# Patient Record
Sex: Female | Born: 1995 | Race: Black or African American | Hispanic: No | Smoking: Never smoker
Health system: Southern US, Community
[De-identification: ages and names within clinical notes are randomized; demographics above are authoritative.]

## PROBLEM LIST (undated history)

## (undated) ENCOUNTER — Inpatient Hospital Stay (HOSPITAL_COMMUNITY): Payer: Self-pay

## (undated) ENCOUNTER — Emergency Department (HOSPITAL_BASED_OUTPATIENT_CLINIC_OR_DEPARTMENT_OTHER): Admission: EM | Payer: 59 | Source: Home / Self Care

## (undated) DIAGNOSIS — R569 Unspecified convulsions: Secondary | ICD-10-CM

## (undated) DIAGNOSIS — E079 Disorder of thyroid, unspecified: Secondary | ICD-10-CM

## (undated) DIAGNOSIS — F319 Bipolar disorder, unspecified: Secondary | ICD-10-CM

## (undated) DIAGNOSIS — K219 Gastro-esophageal reflux disease without esophagitis: Secondary | ICD-10-CM

## (undated) DIAGNOSIS — J45909 Unspecified asthma, uncomplicated: Secondary | ICD-10-CM

## (undated) DIAGNOSIS — G43909 Migraine, unspecified, not intractable, without status migrainosus: Secondary | ICD-10-CM

## (undated) DIAGNOSIS — D169 Benign neoplasm of bone and articular cartilage, unspecified: Secondary | ICD-10-CM

## (undated) DIAGNOSIS — F32A Depression, unspecified: Secondary | ICD-10-CM

## (undated) DIAGNOSIS — F419 Anxiety disorder, unspecified: Secondary | ICD-10-CM

## (undated) DIAGNOSIS — N803 Endometriosis of pelvic peritoneum: Secondary | ICD-10-CM

## (undated) DIAGNOSIS — M62838 Other muscle spasm: Secondary | ICD-10-CM

## (undated) DIAGNOSIS — T7840XA Allergy, unspecified, initial encounter: Secondary | ICD-10-CM

## (undated) HISTORY — PX: COLONOSCOPY: SHX174

## (undated) HISTORY — DX: Unspecified convulsions: R56.9

## (undated) HISTORY — DX: Unspecified asthma, uncomplicated: J45.909

## (undated) HISTORY — DX: Migraine, unspecified, not intractable, without status migrainosus: G43.909

## (undated) HISTORY — PX: KNEE SURGERY: SHX244

## (undated) HISTORY — PX: UPPER GI ENDOSCOPY: SHX6162

## (undated) HISTORY — DX: Gastro-esophageal reflux disease without esophagitis: K21.9

## (undated) HISTORY — DX: Allergy, unspecified, initial encounter: T78.40XA

## (undated) HISTORY — DX: Other muscle spasm: M62.838

## (undated) HISTORY — PX: WISDOM TOOTH EXTRACTION: SHX21

---
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
## 2015-02-11 ENCOUNTER — Emergency Department (HOSPITAL_COMMUNITY): Payer: Medicaid Other

## 2015-02-11 ENCOUNTER — Emergency Department (HOSPITAL_COMMUNITY)
Admission: EM | Admit: 2015-02-11 | Discharge: 2015-02-11 | Disposition: A | Discharge status: Home / Self Care | Payer: Medicaid Other | Attending: Emergency Medicine | Admitting: Emergency Medicine

## 2015-02-11 ENCOUNTER — Encounter (HOSPITAL_COMMUNITY): Payer: Self-pay | Admitting: *Deleted

## 2015-02-11 DIAGNOSIS — M25512 Pain in left shoulder: Secondary | ICD-10-CM

## 2015-02-11 DIAGNOSIS — S4992XA Unspecified injury of left shoulder and upper arm, initial encounter: Secondary | ICD-10-CM | POA: Diagnosis not present

## 2015-02-11 DIAGNOSIS — Z791 Long term (current) use of non-steroidal anti-inflammatories (NSAID): Secondary | ICD-10-CM | POA: Insufficient documentation

## 2015-02-11 DIAGNOSIS — W228XXA Striking against or struck by other objects, initial encounter: Secondary | ICD-10-CM | POA: Insufficient documentation

## 2015-02-11 DIAGNOSIS — Y998 Other external cause status: Secondary | ICD-10-CM | POA: Insufficient documentation

## 2015-02-11 DIAGNOSIS — Z8659 Personal history of other mental and behavioral disorders: Secondary | ICD-10-CM | POA: Diagnosis not present

## 2015-02-11 DIAGNOSIS — Z8639 Personal history of other endocrine, nutritional and metabolic disease: Secondary | ICD-10-CM | POA: Diagnosis not present

## 2015-02-11 DIAGNOSIS — Y9389 Activity, other specified: Secondary | ICD-10-CM | POA: Insufficient documentation

## 2015-02-11 DIAGNOSIS — Y9289 Other specified places as the place of occurrence of the external cause: Secondary | ICD-10-CM | POA: Diagnosis not present

## 2015-02-11 DIAGNOSIS — Z91040 Latex allergy status: Secondary | ICD-10-CM | POA: Diagnosis not present
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
## 2015-02-11 NOTE — Discharge Instructions (Signed)
Please use ice, ibuprofen, Tylenol as needed for pain. Please follow-up with your primary care provider in one week if symptoms persist.

## 2015-02-11 NOTE — ED Provider Notes (Signed)
CSN: [id number]     Arrival date & time 02/11/15  1108 History   First MD Initiated Contact with Patient 02/11/15 1124     Chief Complaint  Patient presents with  . Shoulder Pain   HPI   19 year old female with no significant past medical history presents today with left shoulder pain. Patient reports on Wednesday she was pushed into a wall accidentally and hit the tip of her left acromion process. She notes immediate pain at that time, reports that she's been using ice and previously prescribed meloxicam for the pain. She reports the pain is worse with movements of the shoulder, palpation of the shoulder. She denies any pain at rest, distal extremity weakness loss of sensation or motor function. 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   Past Medical History  Diagnosis Date  . Thyroid disease   . Anxiety    History reviewed. No pertinent past surgical history. No family history on file. Social History  Substance Use Topics  . Smoking status: Never Smoker   . Smokeless tobacco: None  . Alcohol Use: No   OB History    No data available     Review of Systems  All other systems reviewed and are negative.  Allergies  Latex  Home Medications   Prior to Admission medications   Medication Sig Start Date End Date Taking? Authorizing Zoriana Oats  ibuprofen (ADVIL,MOTRIN) 800 MG tablet Take 1 tablet (800 mg total) by mouth 3 (three) times daily. 10/01/14   Dahlia Bailiff, PA-C   BP 123/68 mmHg  Pulse 90  Temp(Src) 99.1 F (37.3 C) (Oral)  Resp 16  SpO2 98%  LMP 02/05/2015   Physical Exam  Constitutional: She is oriented to person, place, and time. She appears well-developed and well-nourished.  HENT:  Head: Normocephalic and atraumatic.  Eyes: Conjunctivae are normal. Pupils are equal, round, and reactive to light. Right eye exhibits no discharge. Left eye exhibits no discharge. No scleral icterus.   Neck: Normal range of motion. No JVD present. No tracheal deviation present.  Pulmonary/Chest: Effort normal. No stridor.  Musculoskeletal:  Redness to the acromion process, tenderness to palpation, no obvious deformities. Patient has minimal tenderness to the soft tissues of the left lateral neck. Full active range of motion of the elbow shoulder, pain with range of motion. distal sensation, strength, perfusion intact  Neurological: She is alert and oriented to person, place, and time. Coordination normal.  Psychiatric: She has a normal mood and affect. Her behavior is normal. Judgment and thought content normal.  Nursing note and vitals reviewed.   ED Course  Procedures (including critical care time)   Labs Review Labs Reviewed - No data to display  Imaging Review Dg Ribs Unilateral W/chest Left  02/11/2015   CLINICAL DATA:  19 year old female with a history of left shoulder pain. Trauma.  EXAM: LEFT RIBS AND CHEST - 3+ VIEW  COMPARISON:  10/01/2014  FINDINGS: No fracture or other bone lesions are seen involving the ribs. There is no evidence of pneumothorax or pleural effusion. Both lungs are clear. Heart size and mediastinal contours are within normal limits.  IMPRESSION: Negative.  Signed,  Dulcy Fanny. Earleen Newport, DO  [facility]  [facility]   Electronically Signed   By: Corrie Mckusick D.O.   On: 02/11/2015 13:33   Dg Shoulder Left  02/11/2015   CLINICAL DATA:  Left posterior  shoulder pain, slammed against wall last night  EXAM: LEFT SHOULDER - 2+ VIEW  COMPARISON:  10/01/2014  FINDINGS: Two views of the left shoulder submitted. No acute fracture or subluxation. AC joint and glenohumeral joint are preserved.  IMPRESSION: Negative.   Electronically Signed   By: Lahoma Crocker M.D.   On: 02/11/2015 11:46   I have personally reviewed and evaluated these images and lab results as part of my medical decision-making.   EKG Interpretation None       MDM   Final diagnoses:  Left shoulder pain    Labs:   Imaging: DG Shoulder, DG ribs- negative   Consults:  Therapeutics:  Discharge Meds: Ibuprofen tylenol   Assessment/Plan: Patient presents with left shoulder pain, negative plain films. She does have some swelling to the shoulder, continue ice, ibuprofen, Tylenol. She has no significant abnormalities of the musculature surrounding the shoulder, intact distal grip strength, sensation. Patient is instructed to follow-up with her primary care Reighlyn Elmes in 1-2 weeks for reevaluation, she was given strict return precautions, patient verbalized understanding and agreement for today's plan had no further questions or concerns at time of discharge        Okey Regal, PA-C 02/11/15 1341  Okey Regal, PA-C 02/11/15 1342  Leonard Schwartz, MD 02/12/15 [phone]

## 2015-03-27 ENCOUNTER — Encounter (HOSPITAL_COMMUNITY): Payer: Self-pay | Admitting: Emergency Medicine

## 2015-03-27 ENCOUNTER — Emergency Department (HOSPITAL_COMMUNITY)
Admission: EM | Admit: 2015-03-27 | Discharge: 2015-03-27 | Disposition: A | Discharge status: Home / Self Care | Payer: Medicaid Other | Attending: Emergency Medicine | Admitting: Emergency Medicine

## 2015-03-27 ENCOUNTER — Emergency Department (HOSPITAL_COMMUNITY): Payer: Medicaid Other

## 2015-03-27 DIAGNOSIS — W2209XA Striking against other stationary object, initial encounter: Secondary | ICD-10-CM | POA: Diagnosis not present

## 2015-03-27 DIAGNOSIS — Z791 Long term (current) use of non-steroidal anti-inflammatories (NSAID): Secondary | ICD-10-CM | POA: Diagnosis not present

## 2015-03-27 DIAGNOSIS — Y9389 Activity, other specified: Secondary | ICD-10-CM | POA: Diagnosis not present

## 2015-03-27 DIAGNOSIS — Y998 Other external cause status: Secondary | ICD-10-CM | POA: Insufficient documentation

## 2015-03-27 DIAGNOSIS — Z8659 Personal history of other mental and behavioral disorders: Secondary | ICD-10-CM | POA: Insufficient documentation

## 2015-03-27 DIAGNOSIS — Z8639 Personal history of other endocrine, nutritional and metabolic disease: Secondary | ICD-10-CM | POA: Diagnosis not present

## 2015-03-27 DIAGNOSIS — S99921A Unspecified injury of right foot, initial encounter: Secondary | ICD-10-CM | POA: Diagnosis present

## 2015-03-27 DIAGNOSIS — Y9289 Other specified places as the place of occurrence of the external cause: Secondary | ICD-10-CM | POA: Insufficient documentation

## 2015-03-27 DIAGNOSIS — Z91040 Latex allergy status: Secondary | ICD-10-CM | POA: Diagnosis not present

## 2015-03-27 DIAGNOSIS — Z86018 Personal history of other benign neoplasm: Secondary | ICD-10-CM | POA: Diagnosis not present

## 2015-03-27 HISTORY — DX: Benign neoplasm of bone and articular cartilage, unspecified: D16.9

## 2015-03-27 MED ORDER — MELOXICAM 15 MG PO TABS: 15.0000 mg | ORAL_TABLET | Freq: Every day | ORAL | Status: DC

## 2015-03-27 NOTE — Discharge Instructions (Signed)
Take mobic as needed for pain. Rest, ice, and elevate your foot for pain and swelling relief.

## 2015-03-27 NOTE — ED Notes (Signed)
Pt states she was mad and kicked a fan and hit the top of her foot on the fan  Pt states the pain runs across the top of her foot and she is unable to move her toes

## 2015-03-27 NOTE — ED Provider Notes (Signed)
History  By signing my name below, I, Marlowe Kays, attest that this documentation has been prepared under the direction and in the presence of [employer], PA-C. Electronically Signed: Marlowe Kays, ED Scribe. 03/27/2015. 9:39 PM.  Chief Complaint  Patient presents with  . Foot Injury   The history is provided by the patient and medical records. No language interpreter was used.    HPI Comments:  Veronica Buchanan is a 19 y.o. female who presents to the Emergency Department complaining of worsening pain of the right foot secondary to kicking a fan three days ago. She reports associated swelling of the dorsal right foot. She has taken Tyenol and Ibuprofen for pain and elevating the area with no significant relief of the pain. She has not iced the area stating she does not have any ice at home. Bearing weight makes the pain worse. She denies alleviating factors of the pain. She denies numbness, tingling or weakness of the right foot, bruising or wounds.   Past Medical History  Diagnosis Date  . Thyroid disease   . Anxiety   . Osteochondroma    History reviewed. No pertinent past surgical history. Family History  Problem Relation Age of Onset  . Asthma Other    Social History  Substance Use Topics  . Smoking status: Never Smoker   . Smokeless tobacco: None  . Alcohol Use: No   OB History    No data available     Review of Systems  Musculoskeletal: Positive for joint swelling and arthralgias.  All other systems reviewed and are negative.   Allergies  Latex  Home Medications   Prior to Admission medications   Medication Sig Start Date End Date Taking? Authorizing Provider  ibuprofen (ADVIL,MOTRIN) 800 MG tablet Take 1 tablet (800 mg total) by mouth 3 (three) times daily. 10/01/14   Dahlia Bailiff, PA-C  meloxicam (MOBIC) 15 MG tablet Take 1 tablet (15 mg total) by mouth daily. 03/27/15   Alvina Chou, PA-C   Triage Vitals: BP 115/57 mmHg  Pulse 75  Temp(Src) 98.1  F (36.7 C) (Oral)  Resp 18  SpO2 100% Physical Exam  Constitutional: She is oriented to person, place, and time. She appears well-developed and well-nourished. No distress.  HENT:  Head: Normocephalic and atraumatic.  Eyes:  Normal appearance  Neck: Normal range of motion.  Cardiovascular: Normal rate and regular rhythm.   Pulmonary/Chest: Effort normal.  Abdominal: Soft. She exhibits no distension. There is no tenderness.  Musculoskeletal: Normal range of motion. She exhibits tenderness.  Dorsal foot tenderness to palpation without swelling, bruising, wound or deformity. Pt able to wiggle toes.  Neurological: She is alert and oriented to person, place, and time. Coordination normal.  Skin: Skin is warm and dry.  Psychiatric: She has a normal mood and affect. Her behavior is normal.  Nursing note and vitals reviewed.   ED Course  Procedures (including critical care time) DIAGNOSTIC STUDIES: Oxygen Saturation is 100% on RA, normal by my interpretation.   COORDINATION OF CARE: 9:36 PM- Advised pt to RICE area. Will prescribe NSAID and advised pt to follow up with PCP for continued pain. Pt verbalizes understanding and agrees to plan.  Medications - No data to display  Labs Review Labs Reviewed - No data to display  Imaging Review Dg Foot Complete Right  03/27/2015  CLINICAL DATA:  Injury 3 days ago, kicked a fan, right foot pain EXAM: RIGHT FOOT COMPLETE - 3+ VIEW COMPARISON:  None. FINDINGS: Three views of the  right foot submitted. No acute fracture or subluxation. No radiopaque foreign body. IMPRESSION: Negative. Electronically Signed   By: Lahoma Crocker M.D.   On: 03/27/2015 20:08   I have personally reviewed and evaluated these images and lab results as part of my medical decision-making.   EKG Interpretation None      MDM   Final diagnoses:  Right foot injury, initial encounter    Xray unremarkable for acute changes. Patient advised to rest, ice, and elevate the  foot for symptom relief.   I personally performed the services described in this documentation, which was scribed in my presence. The recorded information has been reviewed and is accurate.     Alvina Chou, PA-C 03/28/15 0206  Carmin Muskrat, MD 03/29/15 0003

## 2015-04-26 ENCOUNTER — Ambulatory Visit (INDEPENDENT_AMBULATORY_CARE_PROVIDER_SITE_OTHER): Payer: Medicaid Other | Admitting: Allergy and Immunology

## 2015-04-26 ENCOUNTER — Encounter: Payer: Self-pay | Admitting: Allergy and Immunology

## 2015-04-26 VITALS — BP 116/68 | HR 76 | Temp 98.5°F | Resp 18

## 2015-04-26 DIAGNOSIS — J309 Allergic rhinitis, unspecified: Secondary | ICD-10-CM | POA: Diagnosis not present

## 2015-04-26 DIAGNOSIS — R05 Cough: Secondary | ICD-10-CM

## 2015-04-26 DIAGNOSIS — H1010 Acute atopic conjunctivitis, unspecified eye: Secondary | ICD-10-CM

## 2015-04-26 DIAGNOSIS — R062 Wheezing: Secondary | ICD-10-CM | POA: Diagnosis not present

## 2015-04-26 DIAGNOSIS — R059 Cough, unspecified: Secondary | ICD-10-CM

## 2015-04-26 NOTE — Progress Notes (Signed)
FOLLOW UP NOTE  RE: Veronica Buchanan MRN: [id number] DOB: Mar 07, 1996 [facility] [place] [address] [place] [address] Date of Office Visit: 04/26/2015  Subjective:  Veronica Buchanan is a 20 y.o. female who presents today for Cough  Assessment:   1. Cough, patient report of recent upper respiratory infection, afebrile in no respiratory distress.  2. History of wheeze, probable component of mild asthma.  3.      Allergic rhinoconjunctivitis.  Plan:   Patient Instructions  1. Avoidance: significant temperature changes and irritant exposures as discussed. 2. Antihistamine: Zyrtec 10mg  once by mouth once daily for runny nose or itching. 3. Nasal Spray: Flonase 1-2 spray(s) each nostril once daily for stuffy nose or drainage.  4. Inhalers:  Rescue: ProAir 2 puffs every 4 hours as needed for cough or wheeze.       -May use 2 puffs 10-20 minutes prior to exercise.  Preventative: QVAR 62mcg 2 puffs twice daily (Rinse, gargle, and spit out after use). 7. Nasal Saline wash each morning prior to Flonase and each evening at bath/shower time. 8. Follow up Visit: 6 months or sooner if needed.  HPI: Veronica Buchanan returns to the office in follow-up of allergic rhinoconjunctivitis and cough.  She describes a recent "cold" without difficulty in breathing, shortness of breath or wheeze but mostly postnasal drip, nasal congestion and slight cough.  She denied any chest congestion or tightness and did not have to use ProAir.  Her symptoms were greater 4 days ago, a question of associated fever, prior to that.  Any sore throat and headache has resolved and her sleep and activity are normal.  She did use NyQuil a few days and has maintained on her other medications without issue.  She denies any skin issues.  She is pleased overall with how she has done and has no questions or issues today.  She denies emergency department or urgent care visits recently,  prednisone or antibiotics.  Current Medications: 1. Flonase 1-2 sprays once daily. 2. Qvar 80 g 2 puffs once-twice daily. 3.  ProAir HFA as needed. 4.  Zyrtec 10 mg daily. 5.  BuSpar, Synthroid, Vyvanse, Mobic, Atarax daily.  Drug Allergies: Allergies  Allergen Reactions  . Latex    Objective:   Filed Vitals:   04/26/15 1738  BP: 116/68  Pulse: 76  Temp: 98.5 F (36.9 C)  Resp: 18   Physical Exam  Constitutional: She is well-developed, well-nourished, and in no distress.  HENT:  Head: Atraumatic.  Right Ear: Tympanic membrane and ear canal normal.  Left Ear: Tympanic membrane and ear canal normal.  Nose: Mucosal edema present. No rhinorrhea. No epistaxis.  Mouth/Throat: Oropharynx is clear and moist and mucous membranes are normal. No oropharyngeal exudate, posterior oropharyngeal edema or posterior oropharyngeal erythema.  Neck: Neck supple.  Cardiovascular: Normal rate, S1 normal and S2 normal.   No murmur heard. Pulmonary/Chest: Effort normal and breath sounds normal. No respiratory distress. She has no wheezes. She has no rhonchi. She has no rales.  Lymphadenopathy:    She has no cervical adenopathy.   Diagnostics: Spirometry:  FVC  3.23--102%,  FEV1  2.50--88%.      Braley Luckenbaugh M. Ishmael Holter, MD  cc: Eldridge Abrahams, NP

## 2015-04-26 NOTE — Patient Instructions (Signed)
Take Home Sheet  1. Avoidance: significant temperature changes and irritant exposures as discussed.   2. Antihistamine: Zyrtec 10mg  once by mouth once daily for runny nose or itching.   3. Nasal Spray: Flonase 1-2 spray(s) each nostril once daily for stuffy nose or drainage.    4. Inhalers:  Rescue: ProAir 2 puffs every 4 hours as needed for cough or wheeze.       -May use 2 puffs 10-20 minutes prior to exercise.   Preventative: QVAR 22mcg 2 puffs twice daily (Rinse, gargle, and spit out after use).   7. Nasal Saline wash each morning prior to Flonase and each evening at bath/shower time.   8. Follow up Visit: 6 months or sooner if needed.   Websites that have reliable Patient information: 1. American Academy of Asthma, Allergy, & Immunology: www.aaaai.org 2. Food Allergy Network: www.foodallergy.org 3. Mothers of Asthmatics: www.aanma.org 4. [place]: DiningCalendar.de 5. American College of Allergy, Asthma, & Immunology: https://robertson.info/ or www.acaai.org

## 2015-07-27 ENCOUNTER — Emergency Department (HOSPITAL_COMMUNITY)
Admission: EM | Admit: 2015-07-27 | Discharge: 2015-07-27 | Disposition: A | Discharge status: Home / Self Care | Payer: Medicaid Other | Attending: Emergency Medicine | Admitting: Emergency Medicine

## 2015-07-27 ENCOUNTER — Emergency Department (HOSPITAL_COMMUNITY): Payer: Medicaid Other

## 2015-07-27 ENCOUNTER — Encounter (HOSPITAL_COMMUNITY): Payer: Self-pay | Admitting: Emergency Medicine

## 2015-07-27 DIAGNOSIS — S6991XA Unspecified injury of right wrist, hand and finger(s), initial encounter: Secondary | ICD-10-CM | POA: Diagnosis present

## 2015-07-27 DIAGNOSIS — Z91040 Latex allergy status: Secondary | ICD-10-CM | POA: Insufficient documentation

## 2015-07-27 DIAGNOSIS — W228XXA Striking against or struck by other objects, initial encounter: Secondary | ICD-10-CM | POA: Diagnosis not present

## 2015-07-27 DIAGNOSIS — M79641 Pain in right hand: Secondary | ICD-10-CM

## 2015-07-27 DIAGNOSIS — E079 Disorder of thyroid, unspecified: Secondary | ICD-10-CM | POA: Insufficient documentation

## 2015-07-27 DIAGNOSIS — Y998 Other external cause status: Secondary | ICD-10-CM | POA: Insufficient documentation

## 2015-07-27 DIAGNOSIS — F419 Anxiety disorder, unspecified: Secondary | ICD-10-CM | POA: Diagnosis not present

## 2015-07-27 DIAGNOSIS — Z79899 Other long term (current) drug therapy: Secondary | ICD-10-CM | POA: Insufficient documentation

## 2015-07-27 DIAGNOSIS — Y9389 Activity, other specified: Secondary | ICD-10-CM | POA: Diagnosis not present

## 2015-07-27 DIAGNOSIS — F319 Bipolar disorder, unspecified: Secondary | ICD-10-CM | POA: Insufficient documentation

## 2015-07-27 DIAGNOSIS — Y9289 Other specified places as the place of occurrence of the external cause: Secondary | ICD-10-CM | POA: Diagnosis not present

## 2015-07-27 DIAGNOSIS — Z7951 Long term (current) use of inhaled steroids: Secondary | ICD-10-CM | POA: Diagnosis not present

## 2015-07-27 HISTORY — DX: Bipolar disorder, unspecified: F31.9

## 2015-07-27 NOTE — ED Provider Notes (Signed)
CSN: [id number]     Arrival date & time 07/27/15  1216 History  By signing my name below, I, Meriel Pica, attest that this documentation has been prepared under the direction and in the presence of [employer], PA-C.  Electronically Signed: Meriel Pica, ED Scribe. 07/27/2015. 1:47 PM.   Chief Complaint  Patient presents with  . Wrist Pain   The history is provided by the patient. No language interpreter was used.   HPI Comments: Nadirah Durnin is a 20 y.o. female who presents to the Emergency Department complaining of sudden onset, constant, moderate right hand pain, more predominant to dorsal aspect s/p punching a wall with a closed fist. She states she hit the lateral side of her hand on the wall several times. No wound noted to right hand.  Past Medical History  Diagnosis Date  . Thyroid disease   . Anxiety   . Osteochondroma   . Bipolar disorder ([place])    History reviewed. No pertinent past surgical history. Family History  Problem Relation Age of Onset  . Asthma Other    Social History  Substance Use Topics  . Smoking status: Never Smoker   . Smokeless tobacco: None  . Alcohol Use: No   OB History    No data available     Review of Systems  Musculoskeletal: Positive for arthralgias ( right wrist).  Skin: Negative for wound.  All other systems reviewed and are negative.  Allergies  Latex  Home Medications   Prior to Admission medications   Medication Sig Start Date End Date Taking? Authorizing Provider  albuterol (PROVENTIL HFA;VENTOLIN HFA) 108 (90 BASE) MCG/ACT inhaler Inhale 2 puffs into the lungs every 6 (six) hours as needed for wheezing or shortness of breath.    Historical Provider, MD  beclomethasone (QVAR) 80 MCG/ACT inhaler Inhale 2 puffs into the lungs 2 (two) times daily.    Historical Provider, MD  busPIRone (BUSPAR) 10 MG tablet Take 10 mg by mouth 3 (three) times daily.    Historical Provider, MD  cetirizine (ZYRTEC) 10 MG tablet Take 10 mg by  mouth daily.    Historical Provider, MD  fluticasone (FLONASE) 50 MCG/ACT nasal spray Place 1 spray into both nostrils daily.    Historical Provider, MD  hydrOXYzine (ATARAX/VISTARIL) 10 MG tablet Take 10 mg by mouth 3 (three) times daily as needed.    Historical Provider, MD  ibuprofen (ADVIL,MOTRIN) 800 MG tablet Take 1 tablet (800 mg total) by mouth 3 (three) times daily. Patient not taking: Reported on 04/26/2015 10/01/14   Dahlia Bailiff, PA-C  levothyroxine (SYNTHROID, LEVOTHROID) 25 MCG tablet Take 25 mcg by mouth daily before breakfast.    Historical Provider, MD  lisdexamfetamine (VYVANSE) 50 MG capsule Take 50 mg by mouth daily.    Historical Provider, MD  meloxicam (MOBIC) 15 MG tablet Take 1 tablet (15 mg total) by mouth daily. 03/27/15   Kaitlyn Szekalski, PA-C   BP 113/70 mmHg  Pulse 79  Temp(Src) 98.5 F (36.9 C) (Oral)  Resp 18  SpO2 99%  Physical Exam  Constitutional: She is oriented to person, place, and time. She appears well-developed and well-nourished. No distress.  HENT:  Head: Normocephalic.  Eyes: Conjunctivae are normal.  Neck: Normal range of motion. Neck supple.  Cardiovascular: Normal rate.   Pulmonary/Chest: Effort normal. No respiratory distress.  Musculoskeletal: Normal range of motion.  Snuffbox tenderness right hand, no obvious signs of trauma or deformity to the wrist or hands. Superficial abrasion to the ulnar  palm  Neurological: She is alert and oriented to person, place, and time. Coordination normal.  Skin: Skin is warm.  Psychiatric: She has a normal mood and affect. Her behavior is normal.  Nursing note and vitals reviewed.   ED Course  Procedures  DIAGNOSTIC STUDIES: Oxygen Saturation is 99% on RA, normal by my interpretation.    COORDINATION OF CARE: 1:43 PM Discussed treatment plan with pt at bedside and pt agreed to plan.  Imaging Review Dg Wrist Complete Right  07/27/2015  CLINICAL DATA:  Hit wall earlier today EXAM: RIGHT WRIST - COMPLETE  3+ VIEW COMPARISON:  None. FINDINGS: Frontal, oblique, lateral, and ulnar deviation scaphoid images were obtained. On the ulnar deviation scaphoid image, there is a subtle linear lucency in the mid scaphoid bone. This lucency may represent a prominent nutrient foramen. A subtle fracture in this area, however, cannot be entirely excluded radiographically. There is no other evidence of potential fracture. No dislocation. The joint spaces appear intact. No erosive change. IMPRESSION: Subtle transverse lucency in the mid scaphoid bone region. This finding may well represent a prominent nutrient foramen. A subtle nondisplaced fracture in this area is possible, however. If the patient is focally tender in the mid scaphoid region, would advise immobilization with reimaging in 7-10 days. No other findings suggesting potential fracture. No dislocation. No appreciable arthropathy. Electronically Signed   By: Lowella Grip III M.D.   On: 07/27/2015 13:13   Dg Hand Complete Right  07/27/2015  CLINICAL DATA:  Hit wall. Pain along the medial aspect of the hand and wrist. EXAM: RIGHT HAND - COMPLETE 3+ VIEW COMPARISON:  Right wrist 07/27/2015 FINDINGS: There is no evidence of fracture or dislocation. There is no evidence of arthropathy or other focal bone abnormality. Soft tissues are unremarkable. IMPRESSION: Negative. Electronically Signed   By: Markus Daft M.D.   On: 07/27/2015 13:11   I have personally reviewed and evaluated these images results as part of my medical decision-making.   MDM   Final diagnoses:  Pain of right hand   Labs:  Imaging: Dg right wrist results in possible nondisplaced fracture of mid scaphoid; immobilization recommended. Dg right hand negative.  Consults:  Therapeutics:  Discharge Meds:   Assessment/Plan: Will immobilize right wrist/hand. Advised ice and ibuprofen. Discussed with pt she will need a follow up Xray in 2 weeks for further evaluation of scaphoid  tenderness.   Patient X-Ray negative for obvious fracture or dislocation. . Patient given thumb spica while in ED, conservative therapy recommended and discussed. Patient will be discharged home & is agreeable with above plan. Returns precautions discussed. Pt appears safe for discharge.   I personally performed the services described in this documentation, which was scribed in my presence. The recorded information has been reviewed and is accurate.    Okey Regal, PA-C 07/27/15 1451  Quintella Reichert, MD 07/28/15 0700

## 2015-07-27 NOTE — ED Notes (Signed)
Pt sts right wrist pain after punching wall at school; CMS intact

## 2015-07-27 NOTE — Discharge Instructions (Signed)
Discussed with the patient and all questioned fully answered. She will call me if any problems arise.

## 2016-04-07 DIAGNOSIS — J4521 Mild intermittent asthma with (acute) exacerbation: Secondary | ICD-10-CM

## 2016-04-07 DIAGNOSIS — J069 Acute upper respiratory infection, unspecified: Secondary | ICD-10-CM | POA: Insufficient documentation

## 2016-04-23 ENCOUNTER — Emergency Department (HOSPITAL_COMMUNITY)
Admission: EM | Admit: 2016-04-23 | Discharge: 2016-04-24 | Disposition: A | Discharge status: Home / Self Care | Payer: Medicaid Other | Attending: Emergency Medicine | Admitting: Emergency Medicine

## 2016-04-23 ENCOUNTER — Encounter (HOSPITAL_COMMUNITY): Payer: Self-pay | Admitting: Emergency Medicine

## 2016-04-23 DIAGNOSIS — Y999 Unspecified external cause status: Secondary | ICD-10-CM | POA: Diagnosis not present

## 2016-04-23 DIAGNOSIS — Y929 Unspecified place or not applicable: Secondary | ICD-10-CM | POA: Diagnosis not present

## 2016-04-23 DIAGNOSIS — W228XXA Striking against or struck by other objects, initial encounter: Secondary | ICD-10-CM | POA: Insufficient documentation

## 2016-04-23 DIAGNOSIS — S60221A Contusion of right hand, initial encounter: Secondary | ICD-10-CM | POA: Insufficient documentation

## 2016-04-23 DIAGNOSIS — Y939 Activity, unspecified: Secondary | ICD-10-CM | POA: Diagnosis not present

## 2016-04-23 DIAGNOSIS — S6991XA Unspecified injury of right wrist, hand and finger(s), initial encounter: Secondary | ICD-10-CM | POA: Diagnosis present

## 2016-04-23 NOTE — ED Triage Notes (Signed)
Pt. reports pain at right hand after punching a steering wheel yesterday .

## 2016-04-24 ENCOUNTER — Emergency Department (HOSPITAL_COMMUNITY): Payer: Medicaid Other

## 2016-04-24 MED ORDER — NAPROXEN 375 MG PO TABS: 375.0000 mg | ORAL_TABLET | Freq: Two times a day (BID) | ORAL | 0 refills | Status: DC

## 2016-04-24 NOTE — ED Provider Notes (Signed)
[facility] DEPT Provider Note   CSN: [id number] Arrival date & time: 04/23/16  2325     History   Chief Complaint Chief Complaint  Patient presents with  . Hand Injury    HPI Veronica Buchanan is a 20 y.o. female who presents to the ED with hand pain. Patient reports that she was angry yesterday and hit her hand on her steering wheel. She took 200 mg of ibuprofen without relief. She denies any other injuries.   HPI  Past Medical History:  Diagnosis Date  . Anxiety   . Bipolar disorder ([place])   . Osteochondroma   . Thyroid disease     There are no active problems to display for this patient.   History reviewed. No pertinent surgical history.  OB History    No data available       Home Medications    Prior to Admission medications   Medication Sig Start Date End Date Taking? Authorizing Provider  albuterol (PROVENTIL HFA;VENTOLIN HFA) 108 (90 BASE) MCG/ACT inhaler Inhale 2 puffs into the lungs every 6 (six) hours as needed for wheezing or shortness of breath.    Historical Provider, MD  beclomethasone (QVAR) 80 MCG/ACT inhaler Inhale 2 puffs into the lungs 2 (two) times daily.    Historical Provider, MD  busPIRone (BUSPAR) 10 MG tablet Take 10 mg by mouth 3 (three) times daily.    Historical Provider, MD  cetirizine (ZYRTEC) 10 MG tablet Take 10 mg by mouth daily.    Historical Provider, MD  fluticasone (FLONASE) 50 MCG/ACT nasal spray Place 1 spray into both nostrils daily.    Historical Provider, MD  hydrOXYzine (ATARAX/VISTARIL) 10 MG tablet Take 10 mg by mouth 3 (three) times daily as needed.    Historical Provider, MD  ibuprofen (ADVIL,MOTRIN) 800 MG tablet Take 1 tablet (800 mg total) by mouth 3 (three) times daily. Patient not taking: Reported on 04/26/2015 10/01/14   Dahlia Bailiff, PA-C  levothyroxine (SYNTHROID, LEVOTHROID) 25 MCG tablet Take 25 mcg by mouth daily before breakfast.    Historical Provider, MD  lisdexamfetamine (VYVANSE) 50 MG capsule Take 50 mg by  mouth daily.    Historical Provider, MD  meloxicam (MOBIC) 15 MG tablet Take 1 tablet (15 mg total) by mouth daily. 03/27/15   Kaitlyn Szekalski, PA-C  naproxen (NAPROSYN) 375 MG tablet Take 1 tablet (375 mg total) by mouth 2 (two) times daily. 04/24/16   Lasonya Hubner Bunnie Pion, NP    Family History Family History  Problem Relation Age of Onset  . Asthma Other     Social History Social History  Substance Use Topics  . Smoking status: Never Smoker  . Smokeless tobacco: Never Used  . Alcohol use No     Allergies   Latex   Review of Systems Review of Systems Negative except as stated in HIP  Physical Exam Updated Vital Signs BP 113/66 (BP Location: Right Arm)   Pulse 68   Temp 98.6 F (37 C) (Oral)   Resp 16   Ht 5\' 5"  (1.651 m)   Wt 59.9 kg   SpO2 100%   BMI 21.97 kg/m   Physical Exam  Constitutional: She is oriented to person, place, and time. She appears well-developed and well-nourished. No distress.  HENT:  Head: Normocephalic and atraumatic.  Eyes: EOM are normal.  Neck: Neck supple.  Cardiovascular: Normal rate.   Pulmonary/Chest: Effort normal.  Abdominal: Soft. There is no tenderness.  Musculoskeletal:       Right hand:  She exhibits tenderness. She exhibits normal range of motion, normal capillary refill, no deformity, no laceration and no swelling. Normal sensation noted. Normal strength noted. She exhibits no thumb/finger opposition.       Hands: Pain with palpation to the ulnar aspect of the right hand. Radial pulse 2+, adequate circulation.   Neurological: She is alert and oriented to person, place, and time. No cranial nerve deficit.  Skin: Skin is warm and dry.  Psychiatric: She has a normal mood and affect. Her behavior is normal.  Nursing note and vitals reviewed.    ED Treatments / Results  Labs (all labs ordered are listed, but only abnormal results are displayed) Labs Reviewed - No data to display   Radiology Dg Hand Complete Right  Result  Date: 04/24/2016 CLINICAL DATA:  Injury, punched steering wheel now with pain at the entire wrist EXAM: RIGHT HAND - COMPLETE 3+ VIEW COMPARISON:  07/27/2015 FINDINGS: There is no evidence of fracture or dislocation. There is no evidence of arthropathy or other focal bone abnormality. Soft tissues are unremarkable. IMPRESSION: Negative. Electronically Signed   By: Donavan Foil M.D.   On: 04/24/2016 00:25    Procedures Procedures (including critical care time)  Medications Ordered in ED Medications - No data to display   Initial Impression / Assessment and Plan / ED Course  I have reviewed the triage vital signs and the nursing notes.  Pertinent imaging results that were available during my care of the patient were reviewed by me and considered in my medical decision making (see chart for details).  Clinical Course   20 y.o. female with pain to the right hand s/p injury yesterday stable for d/c without focal neuro deficits. Ace wrap applied, ice, elevation. Discussed with the patient and all questioned fully answered. She will return if any problems arise.   Final Clinical Impressions(s) / ED Diagnoses   Final diagnoses:  Contusion of right hand, initial encounter    New Prescriptions New Prescriptions   NAPROXEN (NAPROSYN) 375 MG TABLET    Take 1 tablet (375 mg total) by mouth 2 (two) times daily.     [address] [place], NP 04/24/16 [place], MD 04/24/16 [phone]

## 2016-08-27 ENCOUNTER — Encounter (HOSPITAL_COMMUNITY): Payer: Self-pay

## 2016-08-27 DIAGNOSIS — D649 Anemia, unspecified: Secondary | ICD-10-CM | POA: Insufficient documentation

## 2016-08-27 DIAGNOSIS — Z91040 Latex allergy status: Secondary | ICD-10-CM | POA: Diagnosis not present

## 2016-08-27 DIAGNOSIS — R079 Chest pain, unspecified: Secondary | ICD-10-CM | POA: Diagnosis present

## 2016-08-27 DIAGNOSIS — R748 Abnormal levels of other serum enzymes: Secondary | ICD-10-CM | POA: Diagnosis not present

## 2016-08-27 DIAGNOSIS — E876 Hypokalemia: Secondary | ICD-10-CM | POA: Diagnosis not present

## 2016-08-27 NOTE — ED Triage Notes (Signed)
Pt was just evaluated and released from [facility] for viral gastroenteritis, she was give ranitidine for medication, pt states that this medication makes her heart race and her chest feel funny

## 2016-08-28 ENCOUNTER — Emergency Department (HOSPITAL_COMMUNITY)
Admission: EM | Admit: 2016-08-28 | Discharge: 2016-08-28 | Disposition: A | Discharge status: Home / Self Care | Payer: Medicaid Other | Attending: Emergency Medicine | Admitting: Emergency Medicine

## 2016-08-28 ENCOUNTER — Emergency Department (HOSPITAL_COMMUNITY): Payer: Medicaid Other

## 2016-08-28 DIAGNOSIS — D649 Anemia, unspecified: Secondary | ICD-10-CM

## 2016-08-28 DIAGNOSIS — R748 Abnormal levels of other serum enzymes: Secondary | ICD-10-CM

## 2016-08-28 DIAGNOSIS — E876 Hypokalemia: Secondary | ICD-10-CM

## 2016-08-28 DIAGNOSIS — R079 Chest pain, unspecified: Secondary | ICD-10-CM

## 2016-08-28 LAB — CBC WITH DIFFERENTIAL/PLATELET
Basophils Absolute: 0 10*3/uL (ref 0.0–0.1)
Basophils Relative: 0 %
EOS ABS: 0.1 10*3/uL (ref 0.0–0.7)
Eosinophils Relative: 2 %
HEMATOCRIT: 37 % (ref 36.0–46.0)
Hemoglobin: 11.8 g/dL — ABNORMAL LOW (ref 12.0–15.0)
LYMPHS ABS: 3 10*3/uL (ref 0.7–4.0)
Lymphocytes Relative: 54 %
MCH: 22.6 pg — ABNORMAL LOW (ref 26.0–34.0)
MCHC: 31.9 g/dL (ref 30.0–36.0)
MCV: 70.9 fL — ABNORMAL LOW (ref 78.0–100.0)
MONO ABS: 0.7 10*3/uL (ref 0.1–1.0)
MONOS PCT: 12 %
NEUTROS ABS: 1.8 10*3/uL (ref 1.7–7.7)
Neutrophils Relative %: 32 %
PLATELETS: 285 10*3/uL (ref 150–400)
RBC: 5.22 MIL/uL — ABNORMAL HIGH (ref 3.87–5.11)
RDW: 13.5 % (ref 11.5–15.5)
WBC: 5.6 10*3/uL (ref 4.0–10.5)

## 2016-08-28 LAB — COMPREHENSIVE METABOLIC PANEL
ALT: 19 U/L (ref 14–54)
ANION GAP: 7 (ref 5–15)
AST: 23 U/L (ref 15–41)
Albumin: 4.5 g/dL (ref 3.5–5.0)
Alkaline Phosphatase: 90 U/L (ref 38–126)
BILIRUBIN TOTAL: 0.4 mg/dL (ref 0.3–1.2)
BUN: 8 mg/dL (ref 6–20)
CHLORIDE: 105 mmol/L (ref 101–111)
CO2: 26 mmol/L (ref 22–32)
Calcium: 8.9 mg/dL (ref 8.9–10.3)
Creatinine, Ser: 0.63 mg/dL (ref 0.44–1.00)
GFR calc Af Amer: >60 mL/min (ref >60)
GFR calc non Af Amer: >60 mL/min (ref >60)
GLUCOSE: 82 mg/dL (ref 65–99)
Potassium: 3.3 mmol/L — ABNORMAL LOW (ref 3.5–5.1)
SODIUM: 138 mmol/L (ref 135–145)
TOTAL PROTEIN: 7.7 g/dL (ref 6.5–8.1)

## 2016-08-28 LAB — D-DIMER, QUANTITATIVE: D-Dimer, Quant: <0.27 ug/mL-FEU (ref 0.00–0.50)

## 2016-08-28 LAB — LIPASE, BLOOD: Lipase: 113 U/L — ABNORMAL HIGH (ref 11–51)

## 2016-08-28 MED ORDER — PANTOPRAZOLE SODIUM 40 MG IV SOLR
40.0000 mg | Freq: Once | INTRAVENOUS | Status: AC
  Administered 2016-08-28: 40 mg via INTRAVENOUS
  Filled 2016-08-28: qty 40

## 2016-08-28 MED ORDER — PANTOPRAZOLE SODIUM 20 MG PO TBEC: 20.0000 mg | DELAYED_RELEASE_TABLET | Freq: Every day | ORAL | 0 refills | Status: DC

## 2016-08-28 MED ORDER — POTASSIUM CHLORIDE CRYS ER 20 MEQ PO TBCR
40.0000 meq | EXTENDED_RELEASE_TABLET | Freq: Once | ORAL | Status: AC
  Administered 2016-08-28: 40 meq via ORAL
  Filled 2016-08-28: qty 2

## 2016-08-28 NOTE — ED Provider Notes (Signed)
[facility] DEPT Provider Note   CSN: [id number] Arrival date & time: 08/27/16  2335   By signing my name below, I, Evelene Croon, attest that this documentation has been prepared under the direction and in the presence of [employer], PA-C. Electronically Signed: Evelene Croon, Scribe. 08/28/2016. 1:59 AM.  History   Chief Complaint Chief Complaint  Patient presents with  . Chest Pain    The history is provided by the patient. No language interpreter was used.     HPI Comments:  Veronica Buchanan is a 21 y.o. female who presents to the Emergency Department complaining of central/ left sided CP x 3 days. She describes constant "shocks of pain". No alleviating factors noted. Pt also denies cardiac PMHx. Pt was recently diagnosed with pancreatitis and admitted to [facility] in [place]; was discharged 3 days ago. She was experiencing epigastric pain, nausea, vomiting, and diarrhea at the time of admission. She states she has not vomited in the last 24 hours. Pt denies ETOH consumption. She also denies back pain, dysuria, and fever. Pt is compliant with her Synthroid.    Past Medical History:  Diagnosis Date  . Anxiety   . Bipolar disorder ([place])   . Osteochondroma   . Thyroid disease     There are no active problems to display for this patient.   History reviewed. No pertinent surgical history.  OB History    No data available       Home Medications    Prior to Admission medications   Medication Sig Start Date End Date Taking? Authorizing Provider  albuterol (PROVENTIL HFA;VENTOLIN HFA) 108 (90 BASE) MCG/ACT inhaler Inhale 2 puffs into the lungs every 6 (six) hours as needed for wheezing or shortness of breath.   Yes Historical Provider, MD  beclomethasone (QVAR) 80 MCG/ACT inhaler Inhale 2 puffs into the lungs 2 (two) times daily.   Yes Historical Provider, MD  busPIRone (BUSPAR) 10 MG tablet Take 10 mg by mouth 3 (three) times daily.   Yes Historical  Provider, MD  cetirizine (ZYRTEC) 10 MG tablet Take 10 mg by mouth daily.   Yes Historical Provider, MD  fluticasone (FLONASE) 50 MCG/ACT nasal spray Place 1 spray into both nostrils daily.   Yes Historical Provider, MD  hydrOXYzine (ATARAX/VISTARIL) 10 MG tablet Take 10 mg by mouth 3 (three) times daily as needed for anxiety.    Yes Historical Provider, MD  levothyroxine (SYNTHROID, LEVOTHROID) 25 MCG tablet Take 25 mcg by mouth daily before breakfast.   Yes Historical Provider, MD  lisdexamfetamine (VYVANSE) 50 MG capsule Take 50 mg by mouth daily.   Yes Historical Provider, MD  meloxicam (MOBIC) 15 MG tablet Take 1 tablet (15 mg total) by mouth daily. 03/27/15  Yes Kaitlyn Szekalski, PA-C  naproxen (NAPROSYN) 375 MG tablet Take 1 tablet (375 mg total) by mouth 2 (two) times daily. 04/24/16  Yes [place], NP  ranitidine (ZANTAC) 75 MG tablet Take 75 mg by mouth 2 (two) times daily.   Yes Historical Provider, MD  ibuprofen (ADVIL,MOTRIN) 800 MG tablet Take 1 tablet (800 mg total) by mouth 3 (three) times daily. Patient not taking: Reported on 04/26/2015 10/01/14   Dahlia Bailiff, PA-C  pantoprazole (PROTONIX) 20 MG tablet Take 1 tablet (20 mg total) by mouth daily. 08/28/16   Jarrett Soho Eulises Kijowski, PA-C    Family History Family History  Problem Relation Age of Onset  . Asthma Other     Social History Social History  Substance Use  Topics  . Smoking status: Never Smoker  . Smokeless tobacco: Never Used  . Alcohol use No     Allergies   Latex   Review of Systems Review of Systems  Constitutional: Negative for fever.  Respiratory: Negative for shortness of breath.   Cardiovascular: Positive for chest pain.  Gastrointestinal: Negative for vomiting.  Genitourinary: Negative for dysuria.  Musculoskeletal: Negative for back pain.  All other systems reviewed and are negative.    Physical Exam Updated Vital Signs BP 134/85 (BP Location: Right Arm)   Pulse 77   Temp 98 F (36.7 C)  (Oral)   Resp 17   LMP 07/22/2016   SpO2 100%   Physical Exam  Constitutional: She appears well-developed and well-nourished. No distress.  Awake, alert, nontoxic appearance  HENT:  Head: Normocephalic and atraumatic.  Mouth/Throat: Oropharynx is clear and moist. No oropharyngeal exudate.  Eyes: Conjunctivae are normal. No scleral icterus.  Neck: Normal range of motion. Neck supple.  Cardiovascular: Normal rate, regular rhythm and intact distal pulses.   Pulmonary/Chest: Effort normal and breath sounds normal. No respiratory distress. She has no wheezes. She exhibits tenderness (left upper chest ).  Equal chest expansion  Abdominal: Soft. Bowel sounds are normal. She exhibits no mass. There is no tenderness. There is no rebound and no guarding.  Musculoskeletal: Normal range of motion. She exhibits no edema.  No leg swelling, calf tenderness  Negative Homan's sign  Ambulates with steady gait   Neurological: She is alert.  Speech is clear and goal oriented Moves extremities without ataxia  Skin: Skin is warm and dry. She is not diaphoretic.  Psychiatric: She has a normal mood and affect.  Nursing note and vitals reviewed.    ED Treatments / Results  DIAGNOSTIC STUDIES:  Oxygen Saturation is 100% on RA, normal by my interpretation.    COORDINATION OF CARE:  1:47 AM Discussed treatment plan with pt at bedside and pt agreed to plan.  Labs (all labs ordered are listed, but only abnormal results are displayed) Labs Reviewed  CBC WITH DIFFERENTIAL/PLATELET - Abnormal; Notable for the following:       Result Value   RBC 5.22 (*)    Hemoglobin 11.8 (*)    MCV 70.9 (*)    MCH 22.6 (*)    All other components within normal limits  COMPREHENSIVE METABOLIC PANEL - Abnormal; Notable for the following:    Potassium 3.3 (*)    All other components within normal limits  LIPASE, BLOOD - Abnormal; Notable for the following:    Lipase 113 (*)    All other components within normal  limits  D-DIMER, QUANTITATIVE (NOT AT [facility])    EKG  EKG Interpretation  Date/Time:  Wednesday August 27 2016 23:46:28 EDT Ventricular Rate:  65 PR Interval:    QRS Duration: 83 QT Interval:  385 QTC Calculation: 401 R Axis:   52 Text Interpretation:  Sinus rhythm RSR' in V1 or V2, probably normal variant No previous ECGs available Confirmed by Florina Ou  MD, Jenny Reichmann ([id number]) on 08/27/2016 11:52:07 PM       Radiology Dg Chest 2 View  Result Date: 08/28/2016 CLINICAL DATA:  Upper anterior left chest pain for 72 hours. Dyspnea. EXAM: CHEST  2 VIEW COMPARISON:  02/11/2015 FINDINGS: The lungs are clear. The pulmonary vasculature is normal. Heart size is normal. Hilar and mediastinal contours are unremarkable. There is no pleural effusion. IMPRESSION: No active cardiopulmonary disease. Electronically Signed   By: Valerie Roys.D.  On: 08/28/2016 03:09    Procedures Procedures (including critical care time)  Medications Ordered in ED Medications  potassium chloride SA (K-DUR,KLOR-CON) CR tablet 40 mEq (not administered)  pantoprazole (PROTONIX) injection 40 mg (40 mg Intravenous Given 08/28/16 0223)     Initial Impression / Assessment and Plan / ED Course  I have reviewed the triage vital signs and the nursing notes.  Pertinent labs & imaging results that were available during my care of the patient were reviewed by me and considered in my medical decision making (see chart for details).     Patient is to be discharged with recommendation to follow up with PCP in regards to today's hospital visit. Chest pain is not likely of cardiac or pulmonary etiology d/t presentation, D-dimer negative, PERC negative, VSS, no tracheal deviation, no JVD or new murmur, RRR, breath sounds equal bilaterally, EKG without acute abnormalities, negative troponin, and negative CXR. Patient elevated lipase as expected with her recent history of pancreatitis. She is not vomiting in the emergency department.  Tolerating by mouth without difficulty. Hypokalemia repleted. Mild anemia noted. Patient without shortness of breath or symptomatic anemia symptoms. Pt has been advised to return to the ED if CP becomes exertional, associated with diaphoresis or nausea, radiates to left jaw/arm, worsens or becomes concerning in any way. Pt appears reliable for follow up and is agreeable to discharge.    Final Clinical Impressions(s) / ED Diagnoses   Final diagnoses:  Anemia, unspecified type  Chest pain, unspecified type  Elevated lipase  Hypokalemia    New Prescriptions New Prescriptions   PANTOPRAZOLE (PROTONIX) 20 MG TABLET    Take 1 tablet (20 mg total) by mouth daily.   I personally performed the services described in this documentation, which was scribed in my presence. The recorded information has been reviewed and is accurate.     Jarrett Soho Zanya Lindo, PA-C 08/28/16 [place], MD 08/28/16 [phone]

## 2016-08-28 NOTE — Discharge Instructions (Signed)

## 2016-09-21 ENCOUNTER — Encounter (HOSPITAL_COMMUNITY): Payer: Self-pay | Admitting: Emergency Medicine

## 2016-09-21 ENCOUNTER — Ambulatory Visit (HOSPITAL_COMMUNITY)
Admission: EM | Admit: 2016-09-21 | Discharge: 2016-09-21 | Disposition: A | Discharge status: Home / Self Care | Payer: Medicaid Other | Attending: Internal Medicine | Admitting: Internal Medicine

## 2016-09-21 DIAGNOSIS — H1013 Acute atopic conjunctivitis, bilateral: Secondary | ICD-10-CM | POA: Diagnosis not present

## 2016-09-21 MED ORDER — CROMOLYN SODIUM 4 % OP SOLN: 2.0000 [drp] | Freq: Four times a day (QID) | OPHTHALMIC | 2 refills | Status: DC

## 2016-09-21 NOTE — ED Provider Notes (Signed)
CSN: [id number]     Arrival date & time 09/21/16  1233 History   First MD Initiated Contact with Patient 09/21/16 1401     Chief Complaint  Patient presents with  . Sore Throat  . Facial Swelling   (Consider location/radiation/quality/duration/timing/severity/associated sxs/prior Treatment) The history is provided by the patient.  Eye Problem  Location:  Both eyes Quality:  Tearing Severity:  Moderate Onset quality:  Gradual Duration:  2 days Timing:  Constant Progression:  Unchanged Chronicity:  New Context: not burn, not direct trauma, not foreign body, not scratch and not UV exposure   Relieved by:  None tried Worsened by:  Nothing Ineffective treatments:  None tried Associated symptoms: discharge, itching and tearing   Associated symptoms: no blurred vision, no crusting, no decreased vision, no nausea, no photophobia and no vomiting   Discharge:    Discharge characteristics: clear.   Past Medical History:  Diagnosis Date  . Anxiety   . Bipolar disorder ([place])   . Osteochondroma   . Thyroid disease    History reviewed. No pertinent surgical history. Family History  Problem Relation Age of Onset  . Asthma Other    Social History  Substance Use Topics  . Smoking status: Never Smoker  . Smokeless tobacco: Never Used  . Alcohol use No   OB History    No data available     Review of Systems  Constitutional: Negative for chills and fever.  HENT: Positive for congestion and rhinorrhea. Negative for sinus pain, sinus pressure, sore throat and trouble swallowing.   Eyes: Positive for discharge and itching. Negative for blurred vision and photophobia.  Respiratory: Negative for cough and shortness of breath.   Cardiovascular: Negative for palpitations.  Gastrointestinal: Negative for nausea and vomiting.  Genitourinary: Negative.   Musculoskeletal: Negative.   Skin: Negative.   Neurological: Negative.     Allergies  Latex  Home Medications   Prior to  Admission medications   Medication Sig Start Date End Date Taking? Authorizing Provider  albuterol (PROVENTIL HFA;VENTOLIN HFA) 108 (90 BASE) MCG/ACT inhaler Inhale 2 puffs into the lungs every 6 (six) hours as needed for wheezing or shortness of breath.    Historical Provider, MD  beclomethasone (QVAR) 80 MCG/ACT inhaler Inhale 2 puffs into the lungs 2 (two) times daily.    Historical Provider, MD  busPIRone (BUSPAR) 10 MG tablet Take 10 mg by mouth 3 (three) times daily.    Historical Provider, MD  cetirizine (ZYRTEC) 10 MG tablet Take 10 mg by mouth daily.    Historical Provider, MD  cromolyn (OPTICROM) 4 % ophthalmic solution Place 2 drops into both eyes 4 (four) times daily. 09/21/16   Barnet Glasgow, NP  fluticasone (FLONASE) 50 MCG/ACT nasal spray Place 1 spray into both nostrils daily.    Historical Provider, MD  hydrOXYzine (ATARAX/VISTARIL) 10 MG tablet Take 10 mg by mouth 3 (three) times daily as needed for anxiety.     Historical Provider, MD  levothyroxine (SYNTHROID, LEVOTHROID) 25 MCG tablet Take 25 mcg by mouth daily before breakfast.    Historical Provider, MD  lisdexamfetamine (VYVANSE) 50 MG capsule Take 50 mg by mouth daily.    Historical Provider, MD  meloxicam (MOBIC) 15 MG tablet Take 1 tablet (15 mg total) by mouth daily. 03/27/15   Kaitlyn Szekalski, PA-C  naproxen (NAPROSYN) 375 MG tablet Take 1 tablet (375 mg total) by mouth 2 (two) times daily. 04/24/16   Hope Bunnie Pion, NP  pantoprazole (PROTONIX) 20 MG tablet  Take 1 tablet (20 mg total) by mouth daily. 08/28/16   Hannah Muthersbaugh, PA-C  ranitidine (ZANTAC) 75 MG tablet Take 75 mg by mouth 2 (two) times daily.    Historical Provider, MD   Meds Ordered and Administered this Visit  Medications - No data to display  BP 113/74 (BP Location: Right Arm)   Pulse 61   Temp 98.9 F (37.2 C) (Oral)   Resp 12   SpO2 100%  No data found.   Physical Exam  Constitutional: She is oriented to person, place, and time. She  appears well-developed and well-nourished. No distress.  HENT:  Head: Normocephalic and atraumatic.  Right Ear: Tympanic membrane and external ear normal.  Left Ear: Tympanic membrane and external ear normal.  Nose: Nose normal.  Mouth/Throat: Uvula is midline, oropharynx is clear and moist and mucous membranes are normal. Tonsils are 0 on the right. Tonsils are 0 on the left.  Eyes: Conjunctivae are normal. Right eye exhibits no discharge. Left eye exhibits no discharge.  Neck: Normal range of motion.  Cardiovascular: Normal rate and regular rhythm.   Pulmonary/Chest: Effort normal and breath sounds normal.  Neurological: She is alert and oriented to person, place, and time.  Skin: Skin is warm and dry. Capillary refill takes less than 2 seconds. No rash noted. She is not diaphoretic. No erythema.  Psychiatric: She has a normal mood and affect. Her behavior is normal.  Nursing note and vitals reviewed.   Urgent Care Course     Procedures (including critical care time)  Labs Review Labs Reviewed - No data to display  Imaging Review No results found.    MDM   1. Allergic conjunctivitis of both eyes     Most likely allergic conjunctivitis, continue Zyrtec daily, given Opticrom eyedrops, return to clinic in one week as needed.     Barnet Glasgow, NP 09/21/16 1413

## 2016-09-21 NOTE — ED Triage Notes (Signed)
The patient presented to the [facility] with a complaint of a sore throat and her left eye being swollen and itchy. The patient reported that the symptoms started yesterday.

## 2016-09-21 NOTE — Discharge Instructions (Signed)
Continue to take your Zyrtec every day as prescribed by your provider. For your eyes, I have prescribed Opticrom, place two drops each eye up to 4 times a day as needed. For your sore throat, you may take Chloraseptic lozenges or spray as needed. If symptoms last more than 1 week return to clinic as needed.

## 2017-04-07 ENCOUNTER — Encounter (HOSPITAL_COMMUNITY): Payer: Self-pay | Admitting: Emergency Medicine

## 2017-04-07 ENCOUNTER — Ambulatory Visit (INDEPENDENT_AMBULATORY_CARE_PROVIDER_SITE_OTHER): Payer: Medicaid Other

## 2017-04-07 ENCOUNTER — Ambulatory Visit (HOSPITAL_COMMUNITY)
Admission: EM | Admit: 2017-04-07 | Discharge: 2017-04-07 | Disposition: A | Discharge status: Home / Self Care | Payer: Medicaid Other | Attending: Family Medicine | Admitting: Family Medicine

## 2017-04-07 DIAGNOSIS — D1620 Benign neoplasm of long bones of unspecified lower limb: Secondary | ICD-10-CM | POA: Diagnosis not present

## 2017-04-07 DIAGNOSIS — D169 Benign neoplasm of bone and articular cartilage, unspecified: Secondary | ICD-10-CM

## 2017-04-07 DIAGNOSIS — S39012A Strain of muscle, fascia and tendon of lower back, initial encounter: Secondary | ICD-10-CM

## 2017-04-07 DIAGNOSIS — T148XXA Other injury of unspecified body region, initial encounter: Secondary | ICD-10-CM

## 2017-04-07 DIAGNOSIS — M545 Low back pain: Secondary | ICD-10-CM

## 2017-04-07 DIAGNOSIS — M546 Pain in thoracic spine: Secondary | ICD-10-CM

## 2017-04-07 DIAGNOSIS — R0782 Intercostal pain: Secondary | ICD-10-CM | POA: Diagnosis not present

## 2017-04-07 MED ORDER — CYCLOBENZAPRINE HCL 5 MG PO TABS: 5.0000 mg | ORAL_TABLET | Freq: Every evening | ORAL | 0 refills | Status: DC | PRN

## 2017-04-07 MED ORDER — MELOXICAM 7.5 MG PO TABS: 7.5000 mg | ORAL_TABLET | Freq: Every day | ORAL | 0 refills | Status: DC

## 2017-04-07 MED ORDER — KETOROLAC TROMETHAMINE 30 MG/ML IJ SOLN
INTRAMUSCULAR | Status: AC
  Filled 2017-04-07: qty 1

## 2017-04-07 MED ORDER — KETOROLAC TROMETHAMINE 30 MG/ML IJ SOLN
30.0000 mg | Freq: Once | INTRAMUSCULAR | Status: AC
  Administered 2017-04-07: 30 mg via INTRAMUSCULAR

## 2017-04-07 NOTE — Discharge Instructions (Signed)
Xray negative. Toradol injection in office today. Your exam was most consistent with muscle strain. Start Mobic as directed. Flexeril as needed at night. Flexeril can make you drowsy, so do not take if you are going to drive, operate heavy machinery, or make important decisions. Ice/heat compresses as needed. This can take up to 3-4 weeks to completely resolve, but you should be feeling better each week. Follow up here or with PCP if symptoms worsen, changes for reevaluation. If experience numbness/tingling of the inner thighs, loss of bladder or bowel control, go to the emergency department for evaluation.

## 2017-04-07 NOTE — ED Provider Notes (Signed)
[facility]    CSN: [id number] Arrival date & time: 04/07/17  1534     History   Chief Complaint Chief Complaint  Patient presents with  . Back Pain    HPI Veronica Buchanan is a 21 y.o. female.   21 year old female with history of anxiety, bipolar disorder, osteochondroma comes in for 2 day history of low back pain. Constant, states can be stabbing/aching. Denies numbness/tingling, loss of bladder or bowel control. Denies chest pain, shortness of breath. Ibuprofen 800mg  one dose last night without improvement. She states she was playing with her dog yesterday and wonders if that could have caused pain to start. Denies urinary symptoms such as frequency, dysuria, hematuria. Low abdominal pain during laughing, otherwise without abdominal pain, nausea, vomiting. States taking deep breaths and movement causes worsening of symptoms. She states she did fall 1 week ago down the stairs, although landed forward without impact to the back/ribs. States history of osteochondroma in the knees, although she moved prior to full evaluation.       Past Medical History:  Diagnosis Date  . Anxiety   . Bipolar disorder ([place])   . Osteochondroma   . Thyroid disease     There are no active problems to display for this patient.   History reviewed. No pertinent surgical history.  OB History    No data available       Home Medications    Prior to Admission medications   Medication Sig Start Date End Date Taking? Authorizing Provider  albuterol (PROVENTIL HFA;VENTOLIN HFA) 108 (90 BASE) MCG/ACT inhaler Inhale 2 puffs into the lungs every 6 (six) hours as needed for wheezing or shortness of breath.   Yes [provider]  beclomethasone (QVAR) 80 MCG/ACT inhaler Inhale 2 puffs into the lungs 2 (two) times daily.   Yes [provider]  busPIRone (BUSPAR) 10 MG tablet Take 10 mg by mouth 3 (three) times daily.   Yes [provider]  cetirizine (ZYRTEC) 10 MG  tablet Take 10 mg by mouth daily.   Yes [provider]  cromolyn (OPTICROM) 4 % ophthalmic solution Place 2 drops into both eyes 4 (four) times daily. 09/21/16  Yes Barnet Glasgow, NP  fluticasone (FLONASE) 50 MCG/ACT nasal spray Place 1 spray into both nostrils daily.   Yes [provider]  hydrOXYzine (ATARAX/VISTARIL) 10 MG tablet Take 10 mg by mouth 3 (three) times daily as needed for anxiety.    Yes [provider]  levothyroxine (SYNTHROID, LEVOTHROID) 25 MCG tablet Take 25 mcg by mouth daily before breakfast.   Yes [provider]  lisdexamfetamine (VYVANSE) 50 MG capsule Take 50 mg by mouth daily.   Yes [provider]  ranitidine (ZANTAC) 75 MG tablet Take 75 mg by mouth 2 (two) times daily.   Yes [provider]  cyclobenzaprine (FLEXERIL) 5 MG tablet Take 1 tablet (5 mg total) at bedtime as needed by mouth for muscle spasms. 04/07/17   Ok Edwards, PA-C  meloxicam (MOBIC) 7.5 MG tablet Take 1 tablet (7.5 mg total) daily by mouth. 04/07/17   Tasia Catchings, Reagann Dolce V, PA-C  naproxen (NAPROSYN) 375 MG tablet Take 1 tablet (375 mg total) by mouth 2 (two) times daily. 04/24/16   Ashley Murrain, NP    Family History Family History  Problem Relation Age of Onset  . Asthma Other     Social History Social History   Tobacco Use  . Smoking status: Never Smoker  .  Smokeless tobacco: Never Used  Substance Use Topics  . Alcohol use: No  . Drug use: No     Allergies   Latex   Review of Systems Review of Systems  Reason unable to perform ROS: See HPI as above.     Physical Exam Triage Vital Signs ED Triage Vitals  Enc Vitals Group     BP 04/07/17 1553 103/68     Pulse Rate 04/07/17 1553 89     Resp 04/07/17 1553 18     Temp 04/07/17 1553 98.7 F (37.1 C)     Temp Source 04/07/17 1553 Oral     SpO2 04/07/17 1553 100 %     Weight --      Height --      Head Circumference --      Peak Flow --      Pain Score 04/07/17 1555 8      Pain Loc --      Pain Edu? --      Excl. in [place]? --    No data found.  Updated Vital Signs BP 103/68 (BP Location: Left Arm)   Pulse 89   Temp 98.7 F (37.1 C) (Oral)   Resp 18   LMP 04/06/2017   SpO2 100%   Physical Exam  Constitutional: She is oriented to person, place, and time. She appears well-developed and well-nourished. No distress.  HENT:  Head: Normocephalic and atraumatic.  Eyes: Conjunctivae are normal. Pupils are equal, round, and reactive to light.  Neck: Normal range of motion. Neck supple. No spinous process tenderness and no muscular tenderness present. Normal range of motion present.  Cardiovascular: Normal rate, regular rhythm and normal heart sounds. Exam reveals no gallop and no friction rub.  No murmur heard. Pulmonary/Chest: Effort normal and breath sounds normal. She has no wheezes. She has no rales.  Musculoskeletal:  Diffuse moderate tenderness on palpation of the back, with thoracic region worse than lumbar region. Most tender at right upper back around ribs/scapula. States unable to perform ROM of back due to pain. Full ROM of shoulder and hips, though exacerbates pain. Strength normal and equal bilaterally. Sensation intact.   Neurological: She is alert and oriented to person, place, and time.  Skin: Skin is warm and dry.     UC Treatments / Results  Labs (all labs ordered are listed, but only abnormal results are displayed) Labs Reviewed - No data to display  EKG  EKG Interpretation None       Radiology Dg Ribs Unilateral W/chest Right  Result Date: 04/07/2017 CLINICAL DATA:  Fall with rib pain EXAM: RIGHT RIBS AND CHEST - 3+ VIEW COMPARISON:  08/28/2016 FINDINGS: Single-view chest demonstrates no consolidation or effusion. Normal heart size. No pneumothorax. Right rib series demonstrates no acute displaced right rib fracture IMPRESSION: Negative. Electronically Signed   By: Donavan Foil M.D.   On: 04/07/2017 17:30   Dg Lumbar Spine  Complete  Result Date: 04/07/2017 CLINICAL DATA:  Back pain history of osteochondromata EXAM: LUMBAR SPINE - COMPLETE 4+ VIEW COMPARISON:  01/31/2016 FINDINGS: Intrauterine device in the pelvis. IMPRESSION: Negative. Electronically Signed   By: Donavan Foil M.D.   On: 04/07/2017 17:31   Dg Thoracic Spine 1 View  Result Date: 04/07/2017 CLINICAL DATA:  Fall EXAM: THORACIC SPINE - 1 VIEW COMPARISON:  None. FINDINGS: Lateral alignment is within normal limits. Vertebral body heights are normal. IMPRESSION: Negative. Electronically Signed   By: Donavan Foil M.D.   On: 04/07/2017 17:31  Procedures Procedures (including critical care time)  Medications Ordered in UC Medications  ketorolac (TORADOL) 30 MG/ML injection 30 mg (30 mg Intramuscular Given 04/07/17 1742)     Initial Impression / Assessment and Plan / UC Course  I have reviewed the triage vital signs and the nursing notes.  Pertinent labs & imaging results that were available during my care of the patient were reviewed by me and considered in my medical decision making (see chart for details).    Negative xray. Discussed possible muscle strain causing symptoms. Toradol injection in office. Start NSAID as directed for pain and inflammation. Muscle relaxant as needed. Ice/heat compresses. Discussed with patient strain can take up to 3-4 weeks to resolve, but should be getting better each week. Return precautions given.   Final Clinical Impressions(s) / UC Diagnoses   Final diagnoses:  Osteochondroma  Muscle strain    ED Discharge Orders        Ordered    meloxicam (MOBIC) 7.5 MG tablet  Daily     04/07/17 1738    cyclobenzaprine (FLEXERIL) 5 MG tablet  At bedtime PRN     04/07/17 1738       Ok Edwards, PA-C 04/07/17 1750

## 2017-04-07 NOTE — ED Triage Notes (Addendum)
Pt c/o lower back pain onset yest  Pt denies inj/trauma but does recall playing w/her dog  Sx also include pain on right side of ribs  Denies numbness/tingly of extremities  A&O x4... NAD... Ambulatory

## 2017-09-04 ENCOUNTER — Encounter (HOSPITAL_COMMUNITY): Payer: Self-pay | Admitting: Emergency Medicine

## 2017-09-04 ENCOUNTER — Ambulatory Visit (HOSPITAL_COMMUNITY)
Admission: EM | Admit: 2017-09-04 | Discharge: 2017-09-04 | Disposition: A | Discharge status: Home / Self Care | Payer: Medicaid Other | Attending: Urgent Care | Admitting: Urgent Care

## 2017-09-04 DIAGNOSIS — M25511 Pain in right shoulder: Secondary | ICD-10-CM | POA: Diagnosis not present

## 2017-09-04 DIAGNOSIS — M62838 Other muscle spasm: Secondary | ICD-10-CM

## 2017-09-04 MED ORDER — CYCLOBENZAPRINE HCL 5 MG PO TABS: 5.0000 mg | ORAL_TABLET | Freq: Every day | ORAL | 0 refills | Status: DC

## 2017-09-04 MED ORDER — CELECOXIB 200 MG PO CAPS: 200.0000 mg | ORAL_CAPSULE | Freq: Every day | ORAL | 0 refills | Status: DC

## 2017-09-04 NOTE — Discharge Instructions (Addendum)
Hydrate well with at least 2 liters (1 gallon) of water daily.  °

## 2017-09-04 NOTE — ED Triage Notes (Signed)
Pt c/o R shoulder pain x2 days, denies injury. Full ROM

## 2017-09-04 NOTE — ED Provider Notes (Signed)
  MRN: [id number] DOB: [birth year]  Subjective:   Veronica Buchanan is a 22 y.o. female presenting for a history of achy intermittent right shoulder pain.  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  She does a lot of heavy lifting and over head activity.  She does not drink water consistently at all.  Denies trauma, redness, swelling, bony deformity, falls, weakness.  She is intending on starting a new job to prevent chronic shoulder pain.  No current facility-administered medications for this encounter.   Current Outpatient Medications:  .  cetirizine (ZYRTEC) 10 MG tablet, Take 10 mg by mouth daily., Disp: , Rfl:  .  hydrOXYzine (ATARAX/VISTARIL) 10 MG tablet, Take 10 mg by mouth 3 (three) times daily as needed for anxiety. , Disp: , Rfl:  .  levothyroxine (SYNTHROID, LEVOTHROID) 25 MCG tablet, Take 25 mcg by mouth daily before breakfast., Disp: , Rfl:  .  lisdexamfetamine (VYVANSE) 50 MG capsule, Take 50 mg by mouth daily., Disp: , Rfl:  .  albuterol (PROVENTIL HFA;VENTOLIN HFA) 108 (90 BASE) MCG/ACT inhaler, Inhale 2 puffs into the lungs every 6 (six) hours as needed for wheezing or shortness of breath., Disp: , Rfl:  .  beclomethasone (QVAR) 80 MCG/ACT inhaler, Inhale 2 puffs into the lungs 2 (two) times daily., Disp: , Rfl:  .  celecoxib (CELEBREX) 200 MG capsule, Take 1 capsule (200 mg total) by mouth daily., Disp: 30 capsule, Rfl: 0 .  cromolyn (OPTICROM) 4 % ophthalmic solution, Place 2 drops into both eyes 4 (four) times daily., Disp: 10 mL, Rfl: 2 .  cyclobenzaprine (FLEXERIL) 5 MG tablet, Take 1 tablet (5 mg total) by mouth at bedtime., Disp: 90 tablet, Rfl: 0 .  fluticasone (FLONASE) 50 MCG/ACT nasal spray, Place 1 spray into both nostrils daily., Disp: , Rfl:    Allergies  Allergen Reactions  . Latex Itching    burning    Past Medical History:  Diagnosis Date  . Anxiety   . Bipolar disorder ([place])   . Osteochondroma   . Thyroid disease       Denies past surgical history.  Objective:   Vitals: BP 119/76 (BP Location: Right Arm)   Pulse 65   Temp 98.7 F (37.1 C) (Oral)   Resp 16   SpO2 99%   Physical Exam  Constitutional: She is oriented to person, place, and time. She appears well-developed and well-nourished.  Cardiovascular: Normal rate.  Pulmonary/Chest: Effort normal.  Musculoskeletal:       Right shoulder: She exhibits tenderness (generalized over her deltoid, AC joint but without erythema) and spasm. She exhibits normal range of motion, no swelling, no crepitus, no deformity and normal strength. Abnormal pulses: over trapezius.  Neurological: She is alert and oriented to person, place, and time.   Assessment and Plan :   Acute pain of right shoulder  Trapezius muscle spasm  We will manage conservatively with Celebrex, Flexeril. Counseled patient on potential for adverse effects with medications prescribed today, patient verbalized understanding.  Encouraged patient to pursue changing her job so that she does not continue to have shoulder pain.  Return to clinic precautions discussed.   Jaynee Eagles, [place] 09/04/17 1857

## 2017-09-17 ENCOUNTER — Encounter (HOSPITAL_COMMUNITY): Payer: Self-pay | Admitting: Emergency Medicine

## 2017-09-17 ENCOUNTER — Other Ambulatory Visit: Payer: Self-pay

## 2017-09-17 ENCOUNTER — Ambulatory Visit (HOSPITAL_COMMUNITY)
Admission: EM | Admit: 2017-09-17 | Discharge: 2017-09-17 | Disposition: A | Discharge status: Home / Self Care | Payer: Medicaid Other | Attending: Internal Medicine | Admitting: Internal Medicine

## 2017-09-17 DIAGNOSIS — M25511 Pain in right shoulder: Secondary | ICD-10-CM | POA: Diagnosis not present

## 2017-09-17 DIAGNOSIS — J309 Allergic rhinitis, unspecified: Secondary | ICD-10-CM | POA: Diagnosis not present

## 2017-09-17 DIAGNOSIS — J029 Acute pharyngitis, unspecified: Secondary | ICD-10-CM

## 2017-09-17 MED ORDER — MELOXICAM 7.5 MG PO TABS: 7.5000 mg | ORAL_TABLET | Freq: Every day | ORAL | 0 refills | Status: DC

## 2017-09-17 MED ORDER — KETOROLAC TROMETHAMINE 60 MG/2ML IM SOLN
60.0000 mg | Freq: Once | INTRAMUSCULAR | Status: AC
  Administered 2017-09-17: 60 mg via INTRAMUSCULAR

## 2017-09-17 MED ORDER — KETOROLAC TROMETHAMINE 60 MG/2ML IM SOLN
INTRAMUSCULAR | Status: AC
  Filled 2017-09-17: qty 2

## 2017-09-17 MED ORDER — DEXAMETHASONE SODIUM PHOSPHATE 10 MG/ML IJ SOLN
10.0000 mg | Freq: Once | INTRAMUSCULAR | Status: AC
  Administered 2017-09-17: 10 mg via INTRAMUSCULAR

## 2017-09-17 MED ORDER — IPRATROPIUM BROMIDE 0.06 % NA SOLN: 2.0000 | Freq: Four times a day (QID) | NASAL | 0 refills | Status: DC

## 2017-09-17 MED ORDER — DEXAMETHASONE SODIUM PHOSPHATE 10 MG/ML IJ SOLN
INTRAMUSCULAR | Status: AC
Start: 2017-09-17 — End: ?
  Filled 2017-09-17: qty 1

## 2017-09-17 MED ORDER — BENZONATATE 100 MG PO CAPS: 100.0000 mg | ORAL_CAPSULE | Freq: Three times a day (TID) | ORAL | 0 refills | Status: DC

## 2017-09-17 NOTE — Discharge Instructions (Signed)
Start mobic as directed. Decadron and toradol injection in office today. Warm compress as needed.   Tessalon for cough. Continue zyrtec and flonase. Start atrovent nasal spray for nasal congestion/drainage. You can use over the counter nasal saline rinse such as neti pot for nasal congestion. Keep hydrated, your urine should be clear to pale yellow in color. Tylenol for fever and pain. Monitor for any worsening of symptoms, chest pain, shortness of breath, wheezing, swelling of the throat, follow up for reevaluation.   For sore throat try using a honey-based tea. Use 3 teaspoons of honey with juice squeezed from half lemon. Place shaved pieces of ginger into 1/2-1 cup of water and warm over stove top. Then mix the ingredients and repeat every 4 hours as needed.

## 2017-09-17 NOTE — ED Provider Notes (Signed)
[place]    CSN: [id number] Arrival date & time: 09/17/17  1919     History   Chief Complaint Chief Complaint  Patient presents with  . Sore Throat  . Shoulder Pain    HPI Ronetta Bankhead is a 22 y.o. female.   22 year old female comes in for multiple complaints.   Continued right shoulder pain after being seen 09/04/2017.  States that has been taking Celebrex with some relief after taking medicine, but pain returns.  Denies further injury/trauma.  Full range of motion, though with pain.  2 to 3-day history of URI symptoms.  Has had sore throat, rhinorrhea, nasal congestion, cough, sneezing.  Had 1 day of subjective fever.  OTC cold medication without relief.  Has had to use albuterol inhaler once.  Also on Zyrtec, Flonase.  Never smoker.     Past Medical History:  Diagnosis Date  . Anxiety   . Bipolar disorder ([place])   . Osteochondroma   . Thyroid disease     There are no active problems to display for this patient.   History reviewed. No pertinent surgical history.  OB History   None      Home Medications    Prior to Admission medications   Medication Sig Start Date End Date Taking? Authorizing Provider  albuterol (PROVENTIL HFA;VENTOLIN HFA) 108 (90 BASE) MCG/ACT inhaler Inhale 2 puffs into the lungs every 6 (six) hours as needed for wheezing or shortness of breath.    [provider]  beclomethasone (QVAR) 80 MCG/ACT inhaler Inhale 2 puffs into the lungs 2 (two) times daily.    [provider]  benzonatate (TESSALON) 100 MG capsule Take 1 capsule (100 mg total) by mouth every 8 (eight) hours. 09/17/17   Tasia Catchings, Mckinzy Fuller V, PA-C  celecoxib (CELEBREX) 200 MG capsule Take 1 capsule (200 mg total) by mouth daily. 09/04/17   Jaynee Eagles, PA-C  cetirizine (ZYRTEC) 10 MG tablet Take 10 mg by mouth daily.    [provider]  cromolyn (OPTICROM) 4 % ophthalmic solution Place 2 drops into both eyes 4 (four) times daily. 09/21/16   Barnet Glasgow, NP  cyclobenzaprine (FLEXERIL) 5 MG tablet Take 1 tablet (5 mg total) by mouth at bedtime. 09/04/17   Jaynee Eagles, PA-C  fluticasone (FLONASE) 50 MCG/ACT nasal spray Place 1 spray into both nostrils daily.    [provider]  hydrOXYzine (ATARAX/VISTARIL) 10 MG tablet Take 10 mg by mouth 3 (three) times daily as needed for anxiety.     [provider]  ipratropium (ATROVENT) 0.06 % nasal spray Place 2 sprays into both nostrils 4 (four) times daily. 09/17/17   Tasia Catchings, Arno Cullers V, PA-C  levothyroxine (SYNTHROID, LEVOTHROID) 25 MCG tablet Take 25 mcg by mouth daily before breakfast.    [provider]  lisdexamfetamine (VYVANSE) 50 MG capsule Take 50 mg by mouth daily.    [provider]  meloxicam (MOBIC) 7.5 MG tablet Take 1 tablet (7.5 mg total) by mouth daily. 09/17/17   Ok Edwards, PA-C    Family History Family History  Problem Relation Age of Onset  . Asthma Other     Social History Social History   Tobacco Use  . Smoking status: Never Smoker  . Smokeless tobacco: Never Used  Substance Use Topics  . Alcohol use: No  . Drug use: No     Allergies   Latex   Review of Systems Review of Systems  Reason unable to perform ROS: See HPI  as above.     Physical Exam Triage Vital Signs ED Triage Vitals  Enc Vitals Group     BP 09/17/17 2034 113/70     Pulse Rate 09/17/17 2034 77     Resp 09/17/17 2034 18     Temp 09/17/17 2034 98.3 F (36.8 C)     Temp Source 09/17/17 2034 Oral     SpO2 09/17/17 2034 100 %     Weight --      Height --      Head Circumference --      Peak Flow --      Pain Score 09/17/17 2031 6     Pain Loc --      Pain Edu? --      Excl. in [place]? --    No data found.  Updated Vital Signs BP 113/70 (BP Location: Right Arm)   Pulse 77   Temp 98.3 F (36.8 C) (Oral)   Resp 18   SpO2 100%   Physical Exam  Constitutional: She is oriented to person, place, and time. She appears well-developed and well-nourished.   Non-toxic appearance. She does not appear ill. No distress.  HENT:  Head: Normocephalic and atraumatic.  Right Ear: Tympanic membrane, external ear and ear canal normal. Tympanic membrane is not erythematous and not bulging.  Left Ear: Tympanic membrane, external ear and ear canal normal. Tympanic membrane is not erythematous and not bulging.  Nose: Mucosal edema and rhinorrhea present. Right sinus exhibits no maxillary sinus tenderness and no frontal sinus tenderness. Left sinus exhibits no maxillary sinus tenderness and no frontal sinus tenderness.  Mouth/Throat: Uvula is midline, oropharynx is clear and moist and mucous membranes are normal.  Eyes: Pupils are equal, round, and reactive to light. Conjunctivae are normal.  Neck: Normal range of motion. Neck supple.  Cardiovascular: Normal rate, regular rhythm and normal heart sounds. Exam reveals no gallop and no friction rub.  No murmur heard. Pulmonary/Chest: Effort normal and breath sounds normal. She has no decreased breath sounds. She has no wheezes. She has no rhonchi. She has no rales.  Musculoskeletal:  No tenderness to palpation of spinous processes.  Tenderness to palpation of right trapezius muscle.  Full range of motion of shoulder.  Strength normal and equal bilaterally.  Sensation intact ankle bilaterally.  Radial pulse 2+ and equal bilaterally.  Cap refill less than 2 seconds.  Lymphadenopathy:    She has no cervical adenopathy.  Neurological: She is alert and oriented to person, place, and time.  Skin: Skin is warm and dry.  Psychiatric: She has a normal mood and affect. Her behavior is normal. Judgment normal.     UC Treatments / Results  Labs (all labs ordered are listed, but only abnormal results are displayed) Labs Reviewed - No data to display  EKG None Radiology No results found.  Procedures Procedures (including critical care time)  Medications Ordered in UC Medications  ketorolac (TORADOL) injection 60 mg  (has no administration in time range)  dexamethasone (DECADRON) injection 10 mg (has no administration in time range)     Initial Impression / Assessment and Plan / UC Course  I have reviewed the triage vital signs and the nursing notes.  Pertinent labs & imaging results that were available during my care of the patient were reviewed by me and considered in my medical decision making (see chart for details).    Toradol and Decadron injection in office today.  Start Mobic as directed.  Return precautions  given.  Discussed with patient history and exam could be due to viral illness versus allergic rhinitis.  Symptomatic treatment discussed.  Return precautions given.  Patient expresses understanding and agrees to plan.  Final Clinical Impressions(s) / UC Diagnoses   Final diagnoses:  Acute pain of right shoulder  Allergic rhinitis, unspecified seasonality, unspecified trigger    ED Discharge Orders        Ordered    ipratropium (ATROVENT) 0.06 % nasal spray  4 times daily     09/17/17 2111    meloxicam (MOBIC) 7.5 MG tablet  Daily     09/17/17 2111    benzonatate (TESSALON) 100 MG capsule  Every 8 hours     09/17/17 2111        Ok Edwards, PA-C 09/17/17 2116

## 2017-09-17 NOTE — ED Triage Notes (Signed)
Right shoulder pain. Has been seen for the same, but pain continues.    Sore throat for 2-3 days.  Patient has a runny nose, coughing and sneezing

## 2017-10-30 ENCOUNTER — Other Ambulatory Visit: Payer: Self-pay

## 2017-10-30 ENCOUNTER — Emergency Department (HOSPITAL_COMMUNITY): Payer: Medicaid Other

## 2017-10-30 ENCOUNTER — Emergency Department (HOSPITAL_COMMUNITY)
Admission: EM | Admit: 2017-10-30 | Discharge: 2017-10-30 | Disposition: A | Discharge status: Home / Self Care | Payer: Medicaid Other | Attending: Emergency Medicine | Admitting: Emergency Medicine

## 2017-10-30 ENCOUNTER — Encounter (HOSPITAL_COMMUNITY): Payer: Self-pay | Admitting: *Deleted

## 2017-10-30 DIAGNOSIS — S60221A Contusion of right hand, initial encounter: Secondary | ICD-10-CM | POA: Insufficient documentation

## 2017-10-30 DIAGNOSIS — F419 Anxiety disorder, unspecified: Secondary | ICD-10-CM | POA: Insufficient documentation

## 2017-10-30 DIAGNOSIS — Z79899 Other long term (current) drug therapy: Secondary | ICD-10-CM | POA: Diagnosis not present

## 2017-10-30 DIAGNOSIS — Y998 Other external cause status: Secondary | ICD-10-CM | POA: Diagnosis not present

## 2017-10-30 DIAGNOSIS — Y929 Unspecified place or not applicable: Secondary | ICD-10-CM | POA: Diagnosis not present

## 2017-10-30 DIAGNOSIS — F319 Bipolar disorder, unspecified: Secondary | ICD-10-CM | POA: Diagnosis not present

## 2017-10-30 DIAGNOSIS — W2209XA Striking against other stationary object, initial encounter: Secondary | ICD-10-CM | POA: Diagnosis not present

## 2017-10-30 DIAGNOSIS — Y9389 Activity, other specified: Secondary | ICD-10-CM | POA: Diagnosis not present

## 2017-10-30 DIAGNOSIS — Z91040 Latex allergy status: Secondary | ICD-10-CM | POA: Diagnosis not present

## 2017-10-30 DIAGNOSIS — S63501A Unspecified sprain of right wrist, initial encounter: Secondary | ICD-10-CM | POA: Diagnosis not present

## 2017-10-30 DIAGNOSIS — R52 Pain, unspecified: Secondary | ICD-10-CM

## 2017-10-30 DIAGNOSIS — S6991XA Unspecified injury of right wrist, hand and finger(s), initial encounter: Secondary | ICD-10-CM | POA: Diagnosis present

## 2017-10-30 MED ORDER — ACETAMINOPHEN 500 MG PO TABS: 1000.0000 mg | ORAL_TABLET | Freq: Three times a day (TID) | ORAL | 0 refills | Status: AC

## 2017-10-30 NOTE — ED Provider Notes (Signed)
[facility] DEPARTMENT Provider Note  CSN: [id number] Arrival date & time: 10/30/17 0226  Chief Complaint(s) Hand Pain  HPI Veronica Buchanan is a 22 y.o. female who presents to the emergency department with right hand and wrist pain after she struck the dashboard of her vehicle when she got angry.  Pain is been constant since onset.  Exacerbated with palpation of the hand and range of motion of the wrist.  She has mild bruising to the ring finger knuckle. mildly alleviated by Tylenol.  Also endorses mild swelling.  No loss of sensation or paresthesias.  Denies any other injuries or physical complaints.  HPI  Past Medical History Past Medical History:  Diagnosis Date  . Anxiety   . Bipolar disorder ([place])   . Osteochondroma   . Thyroid disease    There are no active problems to display for this patient.  Home Medication(s) Prior to Admission medications   Medication Sig Start Date End Date Taking? Authorizing Provider  acetaminophen (TYLENOL) 500 MG tablet Take 2 tablets (1,000 mg total) by mouth every 8 (eight) hours for 5 days. Do not take more than 4000 mg of acetaminophen (Tylenol) in a 24-hour period. Please note that other medicines that you may be prescribed may have Tylenol as well. 10/30/17 11/04/17  Fatima Blank, MD  albuterol (PROVENTIL HFA;VENTOLIN HFA) 108 (90 BASE) MCG/ACT inhaler Inhale 2 puffs into the lungs every 6 (six) hours as needed for wheezing or shortness of breath.    [provider]  beclomethasone (QVAR) 80 MCG/ACT inhaler Inhale 2 puffs into the lungs 2 (two) times daily.    [provider]  benzonatate (TESSALON) 100 MG capsule Take 1 capsule (100 mg total) by mouth every 8 (eight) hours. 09/17/17   Tasia Catchings, Amy V, PA-C  celecoxib (CELEBREX) 200 MG capsule Take 1 capsule (200 mg total) by mouth daily. 09/04/17   Jaynee Eagles, PA-C  cetirizine (ZYRTEC) 10 MG tablet Take 10 mg by mouth daily.    [provider]    cromolyn (OPTICROM) 4 % ophthalmic solution Place 2 drops into both eyes 4 (four) times daily. 09/21/16   Barnet Glasgow, NP  cyclobenzaprine (FLEXERIL) 5 MG tablet Take 1 tablet (5 mg total) by mouth at bedtime. 09/04/17   Jaynee Eagles, PA-C  fluticasone (FLONASE) 50 MCG/ACT nasal spray Place 1 spray into both nostrils daily.    [provider]  hydrOXYzine (ATARAX/VISTARIL) 10 MG tablet Take 10 mg by mouth 3 (three) times daily as needed for anxiety.     [provider]  ipratropium (ATROVENT) 0.06 % nasal spray Place 2 sprays into both nostrils 4 (four) times daily. 09/17/17   Tasia Catchings, Amy V, PA-C  levothyroxine (SYNTHROID, LEVOTHROID) 25 MCG tablet Take 25 mcg by mouth daily before breakfast.    [provider]  lisdexamfetamine (VYVANSE) 50 MG capsule Take 50 mg by mouth daily.    [provider]  meloxicam (MOBIC) 7.5 MG tablet Take 1 tablet (7.5 mg total) by mouth daily. 09/17/17   Ok Edwards, PA-C  Past Surgical History History reviewed. No pertinent surgical history. Family History Family History  Problem Relation Age of Onset  . Asthma Other     Social History Social History   Tobacco Use  . Smoking status: Never Smoker  . Smokeless tobacco: Never Used  Substance Use Topics  . Alcohol use: No  . Drug use: No   Allergies Latex  Review of Systems Review of Systems As noted in HPI Physical Exam Vital Signs  I have reviewed the triage vital signs BP 107/81 (BP Location: Right Arm)   Pulse (!) 55   Temp 98.2 F (36.8 C) (Oral)   Resp 16   LMP 10/27/2017   SpO2 99%   Physical Exam  Constitutional: She is oriented to person, place, and time. She appears well-developed and well-nourished. No distress.  HENT:  Head: Normocephalic and atraumatic.  Right Ear: External ear normal.  Left Ear: External ear normal.   Nose: Nose normal.  Eyes: Conjunctivae and EOM are normal. No scleral icterus.  Neck: Normal range of motion and phonation normal.  Cardiovascular: Normal rate and regular rhythm.  Pulmonary/Chest: Effort normal. No stridor. No respiratory distress.  Abdominal: She exhibits no distension.  Musculoskeletal: Normal range of motion. She exhibits no edema.       Right wrist: She exhibits tenderness and bony tenderness. She exhibits normal range of motion, no swelling and no deformity.       Right hand: She exhibits tenderness and bony tenderness. She exhibits normal range of motion. Normal sensation noted. Normal strength noted.       Hands: Neurological: She is alert and oriented to person, place, and time.  Skin: She is not diaphoretic.  Psychiatric: She has a normal mood and affect. Her behavior is normal.  Vitals reviewed.   ED Results and Treatments Labs (all labs ordered are listed, but only abnormal results are displayed) Labs Reviewed - No data to display                                                                                                                       EKG  EKG Interpretation  Date/Time:    Ventricular Rate:    PR Interval:    QRS Duration:   QT Interval:    QTC Calculation:   R Axis:     Text Interpretation:        Radiology Dg Forearm Right  Result Date: 10/30/2017 CLINICAL DATA:  Acute onset of right forearm pain after punching dashboard. Initial encounter. EXAM: RIGHT FOREARM - 2 VIEW COMPARISON:  Right wrist radiograph performed 08/09/2015 FINDINGS: There is no evidence of fracture or dislocation. The radius and ulna appear grossly intact. There is no evidence of fracture or dislocation. Negative ulnar variance is noted. The elbow joint is grossly unremarkable in appearance. No joint effusion is identified. The carpal rows appear grossly intact, and demonstrate normal alignment. No definite soft tissue abnormalities are characterized on radiograph.  IMPRESSION: No evidence of fracture or dislocation.  Electronically Signed   By: Garald Balding M.D.   On: 10/30/2017 03:28   Dg Wrist Complete Right  Result Date: 10/30/2017 CLINICAL DATA:  Acute onset of right wrist pain after punching dashboard. Initial encounter. EXAM: RIGHT WRIST - COMPLETE 3+ VIEW COMPARISON:  Right wrist radiographs performed 08/09/2015 FINDINGS: There is no evidence of fracture or dislocation. The carpal rows are intact, and demonstrate normal alignment. The joint spaces are preserved. Mild negative ulnar variance is noted. No significant soft tissue abnormalities are seen. IMPRESSION: No evidence of fracture or dislocation. Electronically Signed   By: Garald Balding M.D.   On: 10/30/2017 03:29   Dg Hand Complete Right  Result Date: 10/30/2017 CLINICAL DATA:  Acute onset of right hand pain after punching dashboard. Initial encounter. EXAM: RIGHT HAND - COMPLETE 3+ VIEW COMPARISON:  Right hand radiographs performed 08/06/2016 FINDINGS: There is no evidence of fracture or dislocation. The joint spaces are preserved. The carpal rows are intact, and demonstrate normal alignment. Mild negative ulnar variance is noted. The soft tissues are unremarkable in appearance. IMPRESSION: No evidence of fracture or dislocation. Electronically Signed   By: Garald Balding M.D.   On: 10/30/2017 03:27   Pertinent labs & imaging results that were available during my care of the patient were reviewed by me and considered in my medical decision making (see chart for details).  Medications Ordered in ED Medications - No data to display                                                                                                                                  Procedures Procedures  (including critical care time)  Medical Decision Making / ED Course I have reviewed the nursing notes for this encounter and the patient's prior records (if available in EHR or on provided paperwork).    Plain film  of the hand and wrist without acute fracture dislocation.  Cannot rule out occult fracture.  Possible soft tissue contusion.  Also possible wrist sprain.  Will provide patient with wrist brace.  Recommended ice and over-the-counter pain medicine.  Final Clinical Impression(s) / ED Diagnoses Final diagnoses:  Contusion of right hand, initial encounter  Sprain of right wrist, initial encounter   Disposition: Discharge  Condition: Good  I have discussed the results, Dx and Tx plan with the patient who expressed understanding and agree(s) with the plan. Discharge instructions discussed at great length. The patient was given strict return precautions who verbalized understanding of the instructions. No further questions at time of discharge.    ED Discharge Orders        Ordered    acetaminophen (TYLENOL) 500 MG tablet  Every 8 hours     10/30/17 0359       Follow Up: Ber[place] Harvey, [place] [id number] [phone]   In 2 weeks if pain persists  This chart was dictated using voice recognition software.  Despite [place] efforts to proofread,  errors can occur which can change the documentation meaning.   Fatima Blank, MD 10/30/17 [phone]

## 2017-10-30 NOTE — ED Triage Notes (Signed)
Pt punched the dash above her radio. C/o R hand/wrist pain. took tylenol with relief

## 2018-09-02 ENCOUNTER — Other Ambulatory Visit: Payer: Self-pay

## 2018-09-02 ENCOUNTER — Encounter (HOSPITAL_COMMUNITY): Payer: Self-pay | Admitting: Emergency Medicine

## 2018-09-02 ENCOUNTER — Ambulatory Visit (HOSPITAL_COMMUNITY)
Admission: EM | Admit: 2018-09-02 | Discharge: 2018-09-02 | Disposition: A | Discharge status: Home / Self Care | Payer: Medicare Other | Attending: Family Medicine | Admitting: Family Medicine

## 2018-09-02 DIAGNOSIS — N390 Urinary tract infection, site not specified: Secondary | ICD-10-CM | POA: Diagnosis not present

## 2018-09-02 LAB — POCT URINALYSIS DIP (DEVICE)
Bilirubin Urine: NEGATIVE
Glucose, UA: NEGATIVE mg/dL
Ketones, ur: 15 mg/dL — AB
Nitrite: POSITIVE — AB
Protein, ur: 100 mg/dL — AB
Specific Gravity, Urine: >=1.03 (ref 1.005–1.030)
Urobilinogen, UA: 0.2 mg/dL (ref 0.0–1.0)
pH: 6 (ref 5.0–8.0)

## 2018-09-02 MED ORDER — NITROFURANTOIN MONOHYD MACRO 100 MG PO CAPS: 100.0000 mg | ORAL_CAPSULE | Freq: Two times a day (BID) | ORAL | 0 refills | Status: DC

## 2018-09-02 NOTE — ED Triage Notes (Signed)
PT has had discomfort with urination for 3 days. PT has been having issues with her IUD as well. Intermittent pain, can no longer feel strings, feels like IUD is "falling out" when she urinates.

## 2018-09-02 NOTE — Discharge Instructions (Addendum)
Drink plenty of fluids Take nitrofurantoin 2 times a day for 5 days Follow-up with GYN A referral was placed for you by your family practice doctor yesterday and has been approved.  Call your family practice office for further appointment information

## 2018-09-02 NOTE — ED Provider Notes (Signed)
[facility]    CSN: [id number] Arrival date & time: 09/02/18  1126     History   Chief Complaint Chief Complaint  Patient presents with  . Appointment  . Foreign Body in Vagina    IUD issues  . Urinary Tract Infection    HPI Veronica Buchanan is a 23 y.o. female.   HPI  Patient has frequency, dysuria, suprapubic pressure for the last 3 days.  She has tried drinking more water.  She continues to have symptoms.  No flank pain.  No nausea vomiting.  No fever or chills. She is had a Mirena IUD for 2 years.  She thinks she might be having IUD problems.  She thinks it might be out of place.  At times she does not feel the strings. I explained to the patient we do not have ultrasound availability to do a good job with Mirena placement.  I offered to do a pelvic exam which she declines. On review of the chart I see that she called her primary care physician yesterday to get referral for OB/GYN, and that this was approved by her primary care doctor.  I believe that they are getting her an appointment.  I advised her to call back to her PCP  Past Medical History:  Diagnosis Date  . Anxiety   . Bipolar disorder ([employer])   . Osteochondroma   . Thyroid disease     There are no active problems to display for this patient.   History reviewed. No pertinent surgical history.  OB History   No obstetric history on file.      Home Medications    Prior to Admission medications   Medication Sig Start Date End Date Taking? Authorizing Provider  albuterol (PROVENTIL HFA;VENTOLIN HFA) 108 (90 BASE) MCG/ACT inhaler Inhale 2 puffs into the lungs every 6 (six) hours as needed for wheezing or shortness of breath.   Yes [provider]  beclomethasone (QVAR) 80 MCG/ACT inhaler Inhale 2 puffs into the lungs 2 (two) times daily.   Yes [provider]  cetirizine (ZYRTEC) 10 MG tablet Take 10 mg by mouth daily.   Yes [provider]  cromolyn (OPTICROM) 4 % ophthalmic  solution Place 2 drops into both eyes 4 (four) times daily. 09/21/16  Yes Barnet Glasgow, NP  fluticasone (FLONASE) 50 MCG/ACT nasal spray Place 1 spray into both nostrils daily.   Yes [provider]  hydrOXYzine (ATARAX/VISTARIL) 10 MG tablet Take 10 mg by mouth 3 (three) times daily as needed for anxiety.    Yes [provider]  ipratropium (ATROVENT) 0.06 % nasal spray Place 2 sprays into both nostrils 4 (four) times daily. 09/17/17  Yes Yu, Amy V, PA-C  levothyroxine (SYNTHROID, LEVOTHROID) 25 MCG tablet Take 25 mcg by mouth daily before breakfast.   Yes [provider]  lisdexamfetamine (VYVANSE) 50 MG capsule Take 50 mg by mouth daily.   Yes [provider]  meloxicam (MOBIC) 7.5 MG tablet Take 1 tablet (7.5 mg total) by mouth daily. 09/17/17  Yes Yu, Amy V, PA-C  nitrofurantoin, macrocrystal-monohydrate, (MACROBID) 100 MG capsule Take 1 capsule (100 mg total) by mouth 2 (two) times daily. 09/02/18   Raylene Everts, MD    Family History Family History  Problem Relation Age of Onset  . Asthma Other     Social History Social History   Tobacco Use  . Smoking status: Never Smoker  . Smokeless tobacco: Never Used  Substance Use Topics  .  Alcohol use: No  . Drug use: No     Allergies   Latex   Review of Systems Review of Systems  Constitutional: Negative for chills and fever.  HENT: Negative for ear pain and sore throat.   Eyes: Negative for pain and visual disturbance.  Respiratory: Negative for cough and shortness of breath.   Cardiovascular: Negative for chest pain and palpitations.  Gastrointestinal: Negative for abdominal pain and vomiting.  Genitourinary: Positive for dysuria, frequency and urgency. Negative for hematuria, vaginal discharge and vaginal pain.  Musculoskeletal: Negative for arthralgias and back pain.  Skin: Negative for color change and rash.  Neurological: Negative for seizures and syncope.  All other systems  reviewed and are negative.    Physical Exam Triage Vital Signs ED Triage Vitals  Enc Vitals Group     BP 09/02/18 1203 113/76     Pulse Rate 09/02/18 1203 92     Resp 09/02/18 1203 16     Temp 09/02/18 1203 98.6 F (37 C)     Temp src --      SpO2 09/02/18 1203 98 %     Weight --      Height --      Head Circumference --      Peak Flow --      Pain Score 09/02/18 1204 7     Pain Loc --      Pain Edu? --      Excl. in [place]? --    No data found.  Updated Vital Signs BP 113/76   Pulse 92   Temp 98.6 F (37 C)   Resp 16   SpO2 98%      Physical Exam Constitutional:      General: She is not in acute distress.    Appearance: She is well-developed.  HENT:     Head: Normocephalic and atraumatic.  Eyes:     Conjunctiva/sclera: Conjunctivae normal.     Pupils: Pupils are equal, round, and reactive to light.  Neck:     Musculoskeletal: Normal range of motion.  Cardiovascular:     Rate and Rhythm: Normal rate.  Pulmonary:     Effort: Pulmonary effort is normal. No respiratory distress.  Abdominal:     General: There is no distension.     Palpations: Abdomen is soft.     Comments: Abdomen soft and nontender.  No CVA tenderness  Musculoskeletal: Normal range of motion.  Skin:    General: Skin is warm and dry.  Neurological:     General: No focal deficit present.     Mental Status: She is alert.  Psychiatric:        Mood and Affect: Mood normal.      UC Treatments / Results  Labs (all labs ordered are listed, but only abnormal results are displayed) Labs Reviewed  POCT URINALYSIS DIP (DEVICE) - Abnormal; Notable for the following components:      Result Value   Ketones, ur 15 (*)    Hgb urine dipstick MODERATE (*)    Protein, ur 100 (*)    Nitrite POSITIVE (*)    Leukocytes,Ua MODERATE (*)    All other components within normal limits  URINE CULTURE    EKG None  Radiology No results found.  Procedures Procedures (including critical care time)   Medications Ordered in UC Medications - No data to display  Initial Impression / Assessment and Plan / UC Course  I have reviewed the triage vital signs and the  nursing notes.  Pertinent labs & imaging results that were available during my care of the patient were reviewed by me and considered in my medical decision making (see chart for details).     Urine culture pending Final Clinical Impressions(s) / UC Diagnoses   Final diagnoses:  Lower urinary tract infectious disease     Discharge Instructions     Drink plenty of fluids Take nitrofurantoin 2 times a day for 5 days Follow-up with GYN A referral was placed for you by your family practice doctor yesterday and has been approved.  Call your family practice office for further appointment information    ED Prescriptions    Medication Sig Dispense Auth. Provider   nitrofurantoin, macrocrystal-monohydrate, (MACROBID) 100 MG capsule Take 1 capsule (100 mg total) by mouth 2 (two) times daily. 10 capsule Raylene Everts, MD     Controlled Substance Prescriptions [place] Controlled Substance Registry consulted? Not Applicable   Raylene Everts, MD 09/02/18 1241

## 2018-09-04 LAB — URINE CULTURE: Culture: >=100000 — AB

## 2018-09-06 ENCOUNTER — Telehealth (HOSPITAL_COMMUNITY): Payer: Self-pay | Admitting: Emergency Medicine

## 2018-09-06 NOTE — Telephone Encounter (Signed)
Attempted to reach patient. No answer at this time. Notified in [place]

## 2018-11-11 IMAGING — CR DG WRIST COMPLETE 3+V*R*
4 series · 4 of 4 positions shown · non-contrast
Comparison: Right wrist radiographs performed 08/09/2015

CLINICAL DATA: Acute onset of right wrist pain after punching
dashboard. Initial encounter.

EXAM:
RIGHT WRIST - COMPLETE 3+ VIEW

[wrist pa]
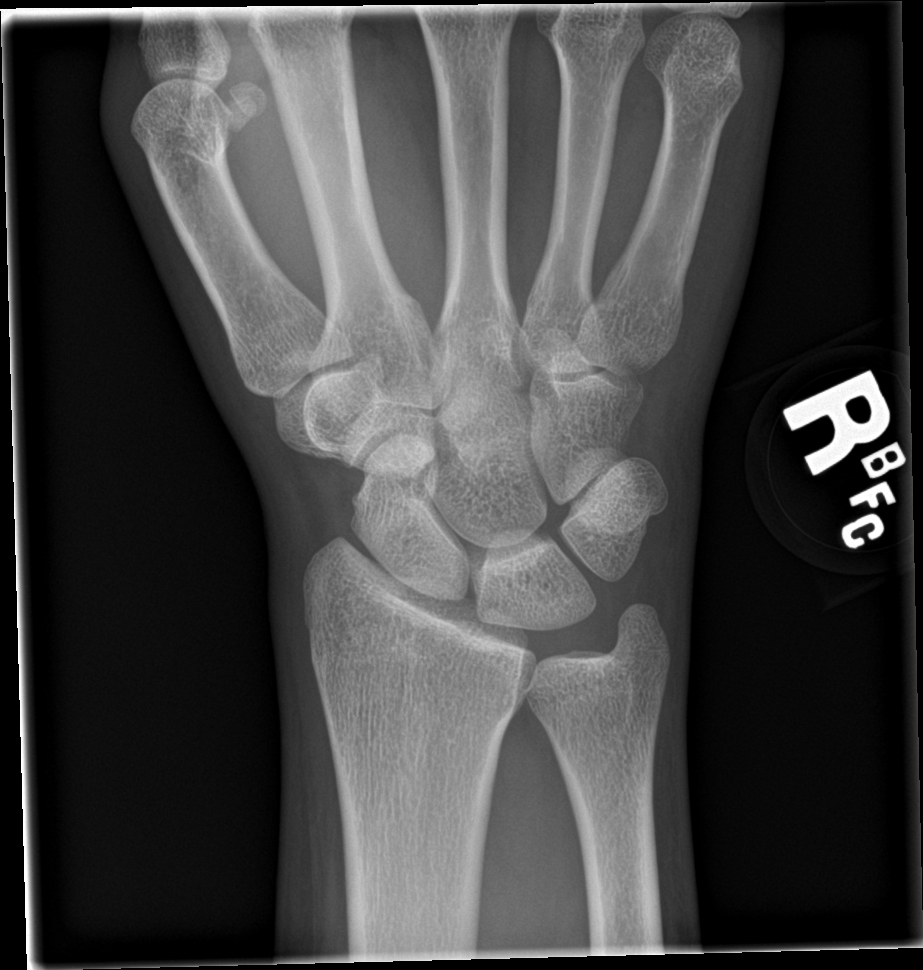

[wrist obl]
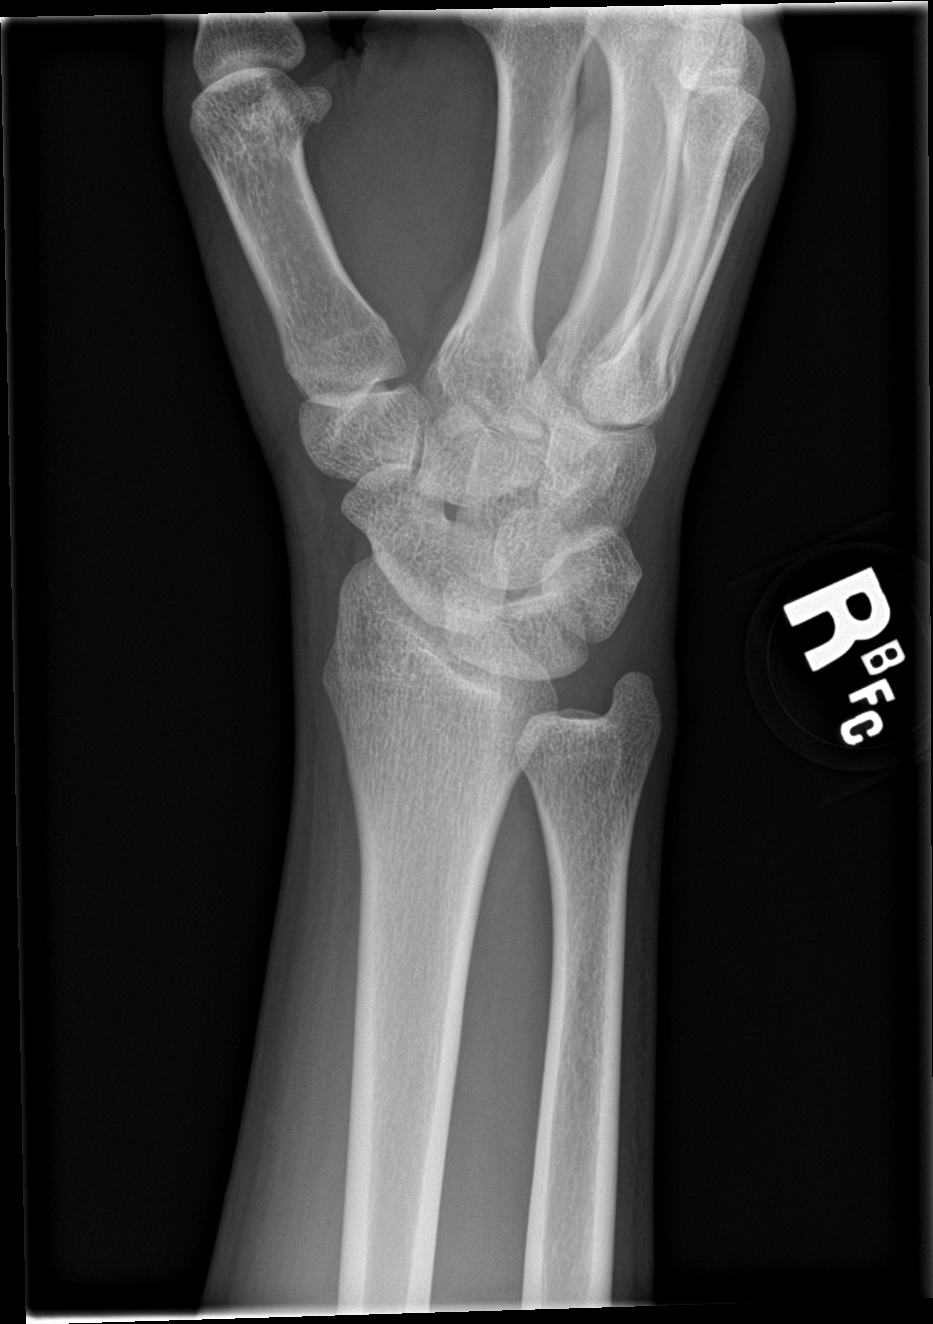

[wrist lat]
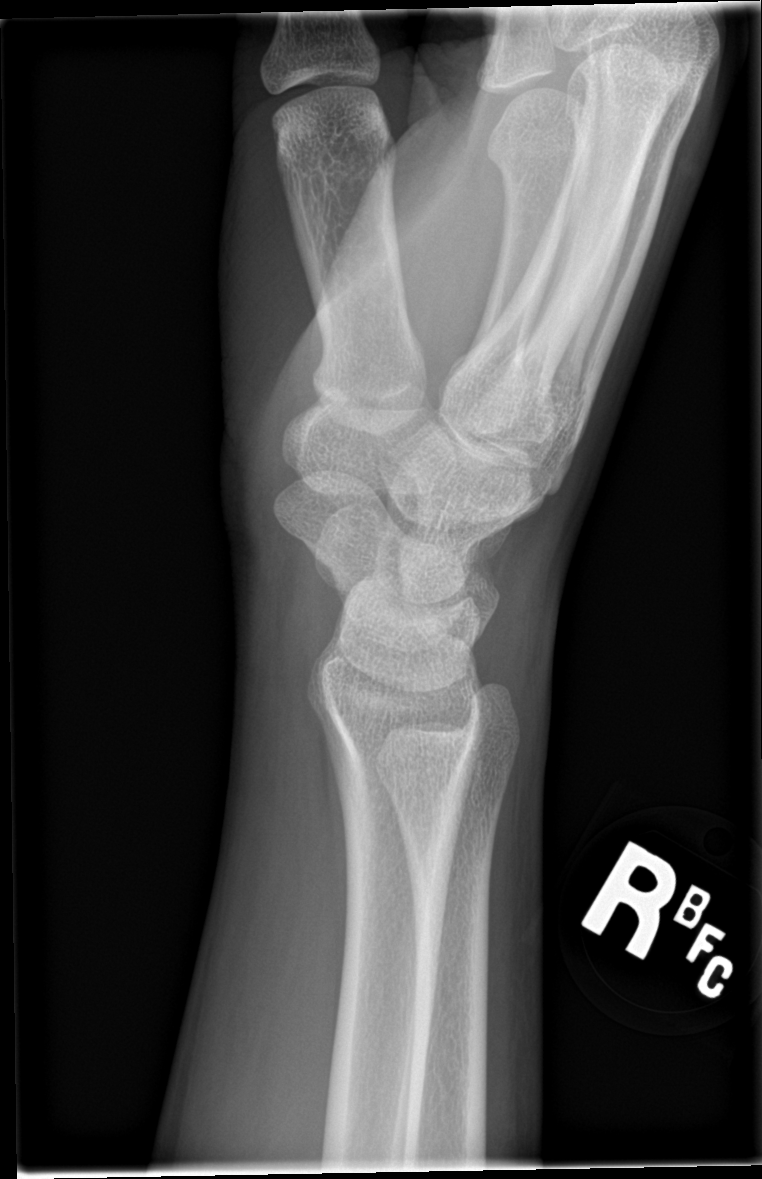

[wrist navicular]
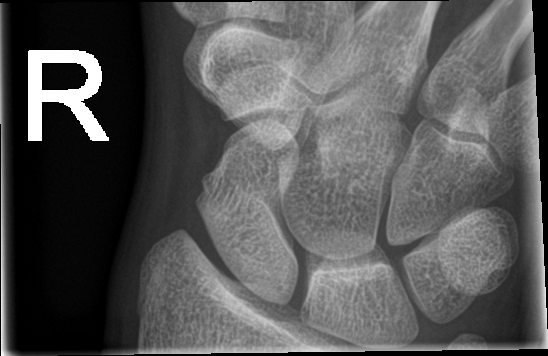

[4 of 4 positions shown; findings below may reference images not displayed]

FINDINGS: There is no evidence of fracture or dislocation. The carpal rows are
intact, and demonstrate normal alignment. The joint spaces are
preserved. Mild negative ulnar variance is noted.

No significant soft tissue abnormalities are seen.
IMPRESSION: No evidence of fracture or dislocation.

## 2018-11-11 IMAGING — CR DG HAND COMPLETE 3+V*R*
3 series · 3 of 3 positions shown · non-contrast
Comparison: Right hand radiographs performed 08/06/2016

CLINICAL DATA: Acute onset of right hand pain after punching
dashboard. Initial encounter.

EXAM:
RIGHT HAND - COMPLETE 3+ VIEW

[hand pa]
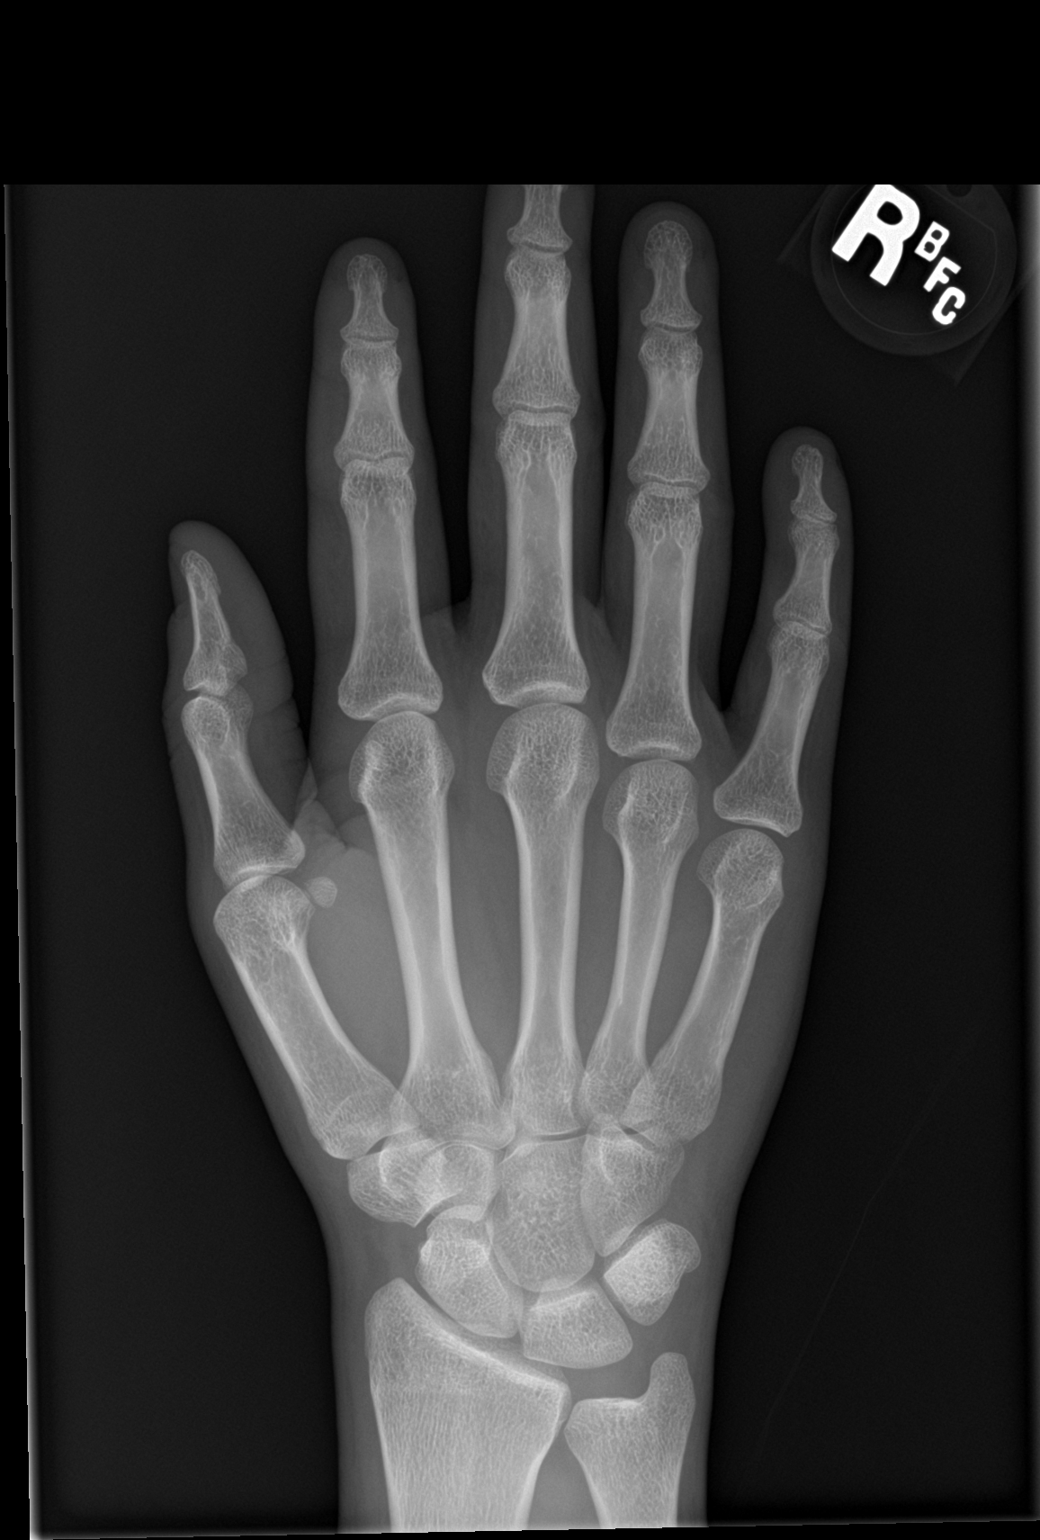

[hand obl]
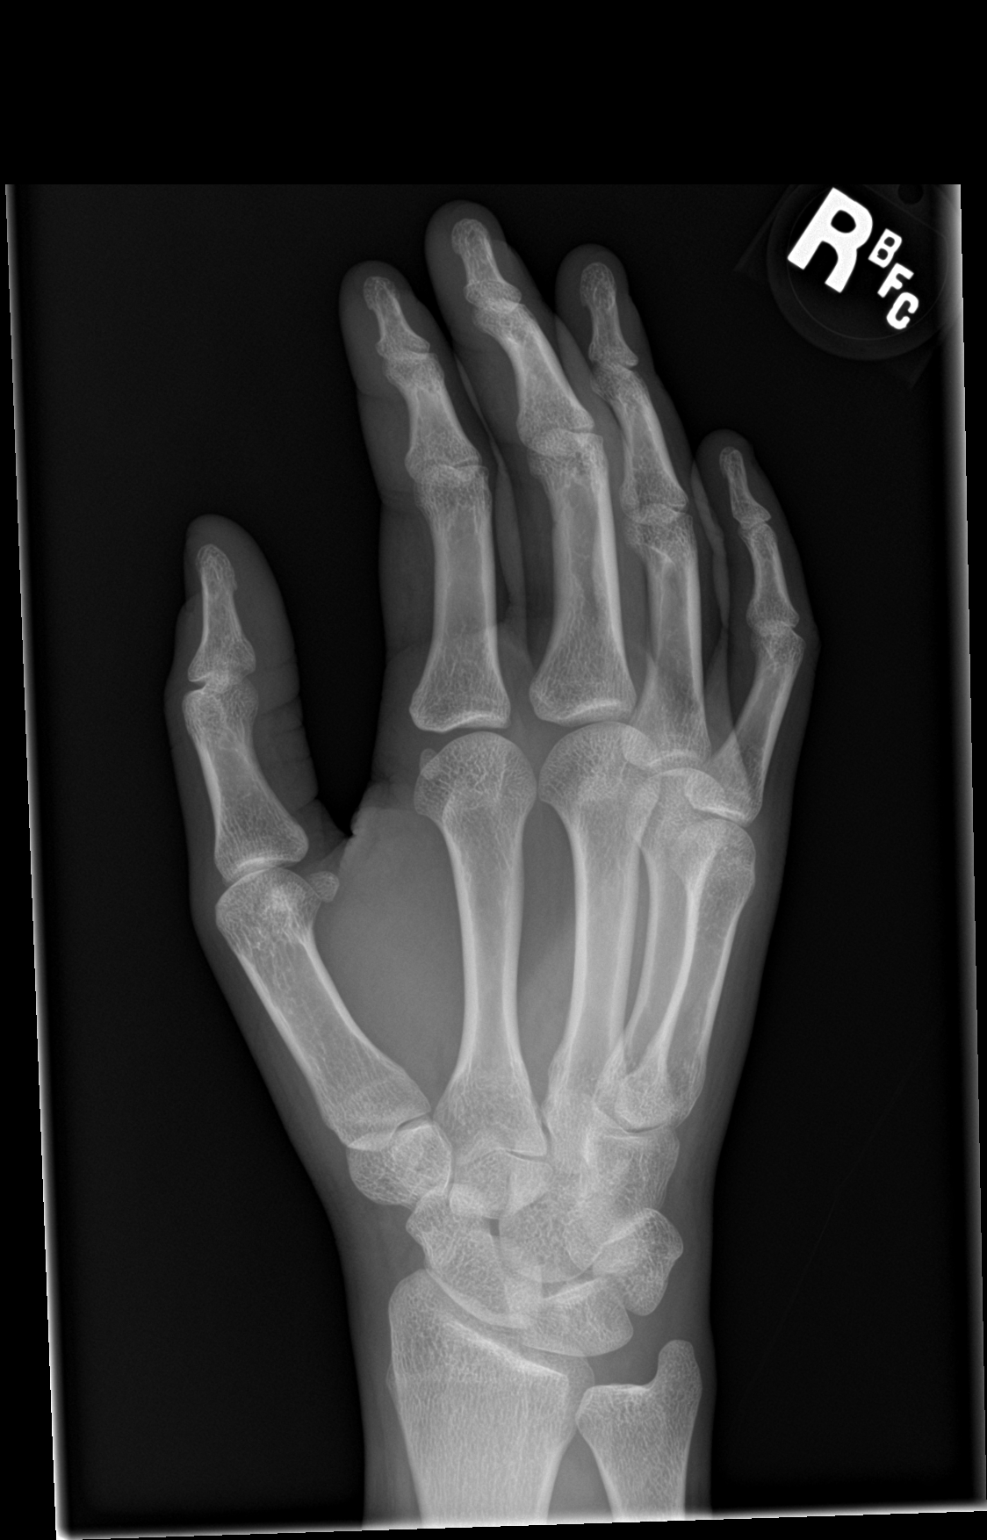

[hand lat]
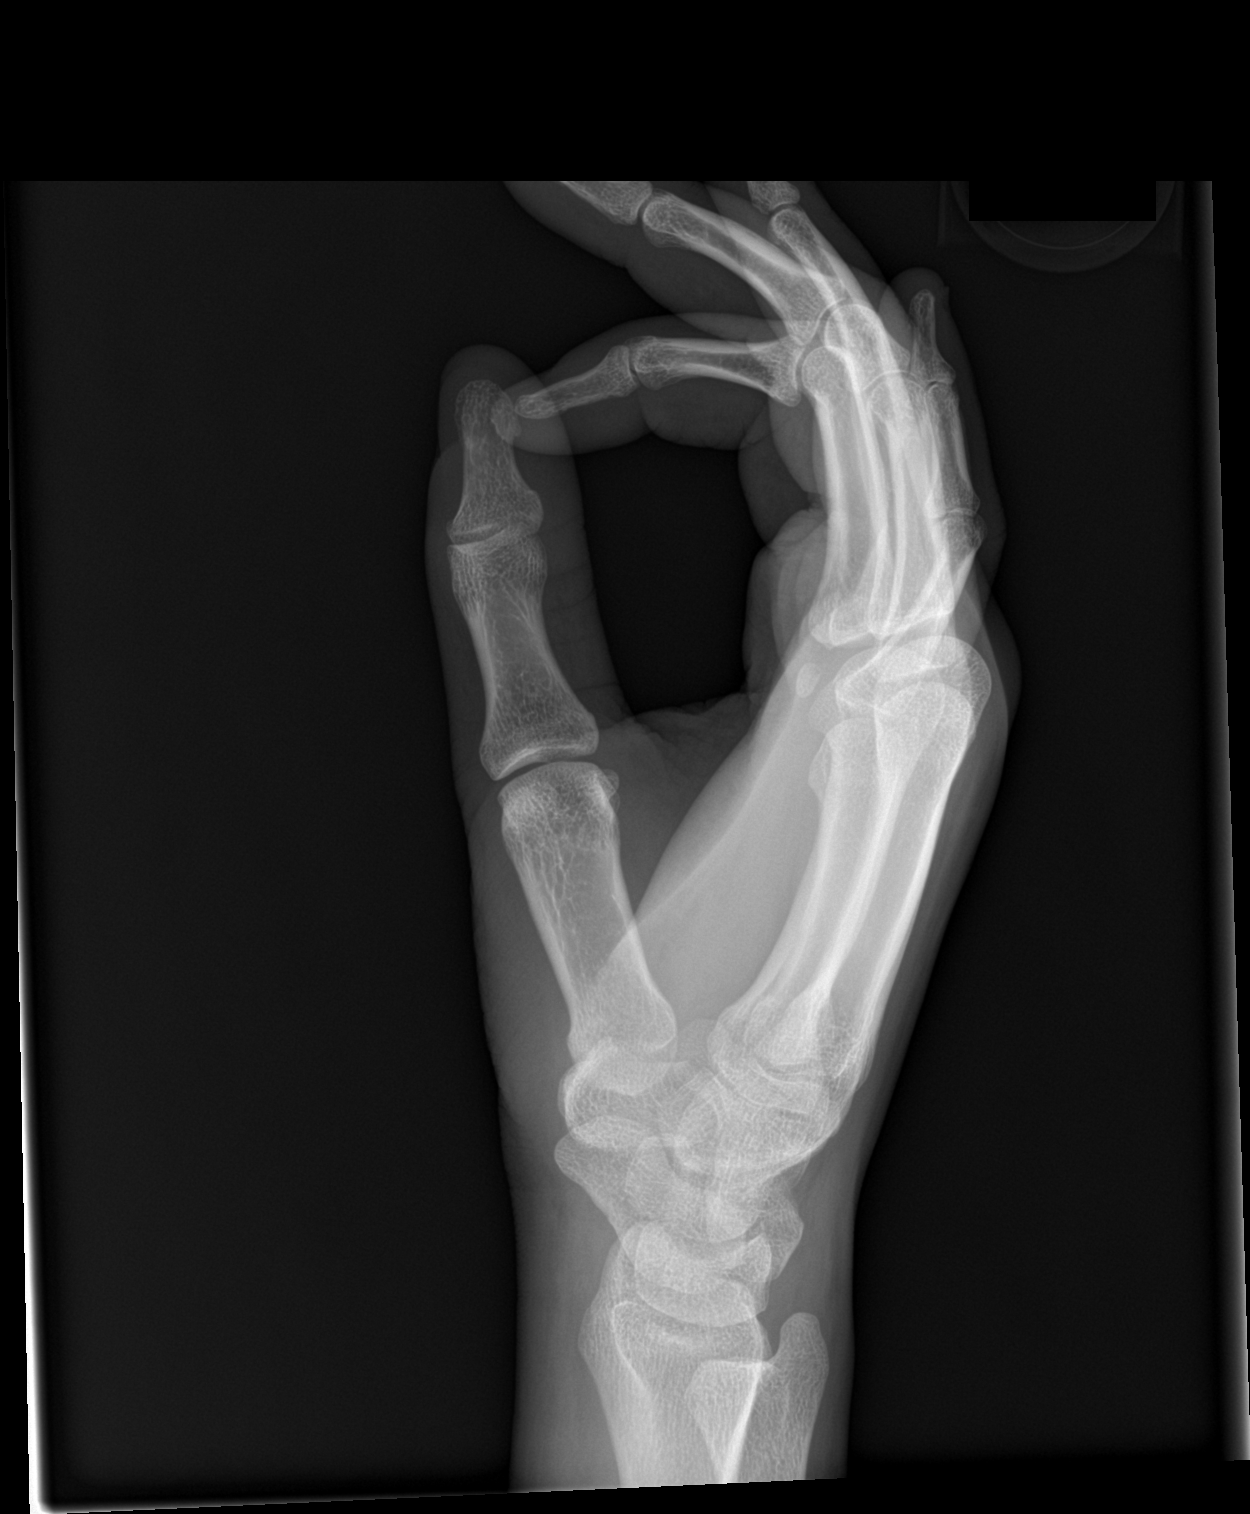

[3 of 3 positions shown; findings below may reference images not displayed]

FINDINGS: There is no evidence of fracture or dislocation. The joint spaces
are preserved. The carpal rows are intact, and demonstrate normal
alignment. Mild negative ulnar variance is noted.

The soft tissues are unremarkable in appearance.
IMPRESSION: No evidence of fracture or dislocation.

## 2018-12-01 ENCOUNTER — Ambulatory Visit: Payer: Medicare Other | Admitting: Advanced Practice Midwife

## 2018-12-22 ENCOUNTER — Ambulatory Visit (INDEPENDENT_AMBULATORY_CARE_PROVIDER_SITE_OTHER): Payer: Medicare Other | Admitting: Obstetrics & Gynecology

## 2018-12-22 ENCOUNTER — Other Ambulatory Visit: Payer: Self-pay

## 2018-12-22 ENCOUNTER — Encounter: Payer: Self-pay | Admitting: Obstetrics & Gynecology

## 2018-12-22 ENCOUNTER — Other Ambulatory Visit (HOSPITAL_COMMUNITY)
Admission: RE | Admit: 2018-12-22 | Discharge: 2018-12-22 | Disposition: A | Discharge status: Home / Self Care | Payer: Medicare Other | Source: Ambulatory Visit | Attending: Advanced Practice Midwife | Admitting: Advanced Practice Midwife

## 2018-12-22 VITALS — BP 113/76 | HR 96 | Temp 99.3°F | Ht 66.0 in | Wt 158.3 lb

## 2018-12-22 DIAGNOSIS — Z8041 Family history of malignant neoplasm of ovary: Secondary | ICD-10-CM

## 2018-12-22 DIAGNOSIS — R110 Nausea: Secondary | ICD-10-CM | POA: Diagnosis not present

## 2018-12-22 DIAGNOSIS — Z23 Encounter for immunization: Secondary | ICD-10-CM

## 2018-12-22 DIAGNOSIS — R102 Pelvic and perineal pain: Secondary | ICD-10-CM | POA: Diagnosis not present

## 2018-12-22 DIAGNOSIS — Z01419 Encounter for gynecological examination (general) (routine) without abnormal findings: Secondary | ICD-10-CM

## 2018-12-22 DIAGNOSIS — Z3202 Encounter for pregnancy test, result negative: Secondary | ICD-10-CM

## 2018-12-22 DIAGNOSIS — G8929 Other chronic pain: Secondary | ICD-10-CM

## 2018-12-22 LAB — POCT URINE PREGNANCY: Preg Test, Ur: NEGATIVE

## 2018-12-22 NOTE — Patient Instructions (Addendum)
Diagnostic Laparoscopy Diagnostic laparoscopy is a procedure to diagnose diseases in the abdomen. It might be done for a variety of reasons, such as to look for scar tissue, cancer, or a reason for abdomen (abdominal) pain. During the procedure, a thin, flexible tube that has a light and a camera on the end (laparoscope) is inserted through an incision in the abdomen. The image from the camera is shown on a monitor to help your surgeon see inside your body. Tell a health care provider about:  Any allergies you have.  All medicines you are taking, including vitamins, herbs, eye drops, creams, and over-the-counter medicines.  Any problems you or family members have had with anesthetic medicines.  Any blood disorders you have.  Any surgeries you have had.  Any medical conditions you have. What are the risks? Generally, this is a safe procedure. However, problems may occur, including:  Infection.  Bleeding.  Allergic reactions to medicines or dyes.  Damage to abdominal structures or organs, such as the intestines, liver, stomach, or spleen. What happens before the procedure? Medicines  Ask your health care provider about: ? Changing or stopping your regular medicines. This is especially important if you are taking diabetes medicines or blood thinners. ? Taking medicines such as aspirin and ibuprofen. These medicines can thin your blood. Do not take these medicines unless your health care provider tells you to take them. ? Taking over-the-counter medicines, vitamins, herbs, and supplements.  You may be given antibiotic medicine to help prevent infection. Staying hydrated Follow instructions from your health care provider about hydration, which may include:  Up to 2 hours before the procedure - you may continue to drink clear liquids, such as water, clear fruit juice, black coffee, and plain tea. Eating and drinking restrictions Follow instructions from your health care provider  about eating and drinking, which may include:  8 hours before the procedure - stop eating heavy meals or foods such as meat, fried foods, or fatty foods.  6 hours before the procedure - stop eating light meals or foods, such as toast or cereal.  6 hours before the procedure - stop drinking milk or drinks that contain milk.  2 hours before the procedure - stop drinking clear liquids. General instructions  Ask your health care provider how your surgical site will be marked or identified.  You may be asked to shower with a germ-killing soap.  Plan to have someone take you home from the hospital or clinic.  Plan to have a responsible adult care for you for at least 24 hours after you leave the hospital or clinic. This is important. What happens during the procedure?   To lower your risk of infection: ? Your health care team will wash or sanitize their hands. ? Hair may be removed from the surgical area. ? Your skin will be washed with soap.  An IV will be inserted into one of your veins.  You will be given a medicine to make you fall asleep (general anesthetic). You may also be given a medicine to help you relax (sedative).  A breathing tube will be placed down your throat to help you breathe during the procedure.  Your abdomen will be filled with an air-like gas so it expands. This will give the surgeon more room to operate and will make your organs easier to see.  Many small incisions will be made in your abdomen.  A laparoscope and other surgical instruments will be inserted into your abdomen through the   incisions.  A tissue sample may be removed from an organ for examination (biopsy). This will depend on the reason why you are having this procedure.  The laparoscope and other instruments will be removed from your abdomen.  The gas will be released.  Your incisions will be closed with stitches (sutures) and covered with a bandage (dressing).  Your breathing tube will be  removed. The procedure may vary among health care providers and hospitals. What happens after the procedure?   Your blood pressure, heart rate, breathing rate, and blood oxygen level will be monitored until the medicines you were given have worn off.  Do not drive for 24 hours if you were given a sedative during your procedure.  It is up to you to get the results of your procedure. Ask your health care provider, or the department that is doing the procedure, when your results will be ready. Summary  Diagnostic laparoscopy is a way to look for problems in the abdomen using small incisions.  Follow instructions from your health care provider about how to prepare for the procedure.  Plan to have a responsible adult care for you for at least 24 hours after you leave the hospital or clinic. This is important. This information is not intended to replace advice given to you by your health care provider. Make sure you discuss any questions you have with your health care provider. Document Released: 08/18/2000 Document Revised: 04/24/2017 Document Reviewed: 11/05/2016 Elsevier Patient Education Pelvic Pain, Female Pelvic pain is pain in your lower abdomen, below your belly button and between your hips. The pain may start suddenly (be acute), keep coming back (be recurring), or last a long time (become chronic). Pelvic pain that lasts longer than 6 months is considered chronic. Pelvic pain may affect your: Reproductive organs. Urinary system. Digestive tract. Musculoskeletal system. There are many potential causes of pelvic pain. Sometimes, the pain can be a result of digestive or urinary conditions, strained muscles or ligaments, or reproductive conditions. Sometimes the cause of pelvic pain is not known. Follow these instructions at home:  Take over-the-counter and prescription medicines only as told by your health care provider. Rest as told by your health care provider. Do not have sex if  it hurts. Keep a journal of your pelvic pain. Write down: When the pain started. Where the pain is located. What seems to make the pain better or worse, such as food or your period (menstrual cycle). Any symptoms you have along with the pain. Keep all follow-up visits as told by your health care provider. This is important. Contact a health care provider if: Medicine does not help your pain. Your pain comes back. You have new symptoms. You have abnormal vaginal discharge or bleeding, including bleeding after menopause. You have a fever or chills. You are constipated. You have blood in your urine or stool. You have foul-smelling urine. You feel weak or light-headed. Get help right away if: You have sudden severe pain. Your pain gets steadily worse. You have severe pain along with fever, nausea, vomiting, or excessive sweating. You lose consciousness. Summary Pelvic pain is pain in your lower abdomen, below your belly button and between your hips. There are many potential causes of pelvic pain. Keep a journal of your pelvic pain. This information is not intended to replace advice given to you by your health care provider. Make sure you discuss any questions you have with your health care provider. Document Released: 04/08/2004 Document Revised: 10/28/2017 Document Reviewed: 10/28/2017  Elsevier Patient Education  [employer].   HPV (Human Papillomavirus) Vaccine: What You Need to Know 1. Why get vaccinated? HPV (Human papillomavirus) vaccine can prevent infection with some types of human papillomavirus. HPV infections can cause certain types of cancers including:  cervical, vaginal and vulvar cancers in women,  penile cancer in men, and  anal cancers in both men and women. HPV vaccine prevents infection from the HPV types that cause over 90% of these cancers. HPV is spread through intimate skin-to-skin or sexual contact. HPV infections are so common that nearly all men  and women will get at least one type of HPV at some time in their lives. Most HPV infections go away by themselves within 2 years. But sometimes HPV infections will last longer and can cause cancers later in life. 2. HPV vaccine HPV vaccine is routinely recommended for adolescents at 29 or 23 years of age to ensure they are protected before they are exposed to the virus. HPV vaccine may be given beginning at age 25 years, and as late as age 39 years. Most people older than 26 years will not benefit from HPV vaccination. Talk with your health care provider if you want more information. Most children who get the first dose before 53 years of age need 2 doses of HPV vaccine. Anyone who gets the first dose on or after 23 years of age, and younger people with certain immunocompromising conditions, need 3 doses. Your health care provider can give you more information. HPV vaccine may be given at the same time as other vaccines. 3. Talk with your health care provider Tell your vaccine provider if the person getting the vaccine:  Has had an allergic reaction after a previous dose of HPV vaccine, or has any severe, life-threatening allergies.  Is pregnant. In some cases, your health care provider may decide to postpone HPV vaccination to a future visit. People with minor illnesses, such as a cold, may be vaccinated. People who are moderately or severely ill should usually wait until they recover before getting HPV vaccine. Your health care provider can give you more information. 4. Risks of a vaccine reaction  Soreness, redness, or swelling where the shot is given can happen after HPV vaccine.  Fever or headache can happen after HPV vaccine. People sometimes faint after medical procedures, including vaccination. Tell your provider if you feel dizzy or have vision changes or ringing in the ears. As with any medicine, there is a very remote chance of a vaccine causing a severe allergic reaction, other  serious injury, or death. 5. What if there is a serious problem? An allergic reaction could occur after the vaccinated person leaves the clinic. If you see signs of a severe allergic reaction (hives, swelling of the face and throat, difficulty breathing, a fast heartbeat, dizziness, or weakness), call 9-1-1 and get the person to the nearest hospital. For other signs that concern you, call your health care provider. Adverse reactions should be reported to the Vaccine Adverse Event Reporting System (VAERS). Your health care provider will usually file this report, or you can do it yourself. Visit the VAERS website at www.vaers.SamedayNews.es or call 203-669-7696. VAERS is only for reporting reactions, and VAERS staff do not give medical advice. 6. The National Vaccine Injury Compensation Program The [employer] Vaccine Injury Compensation Program (VICP) is a federal program that was created to compensate people who may have been injured by certain vaccines. Visit the VICP website at GoldCloset.com.ee or call 502-369-0098 to  learn about the program and about filing a claim. There is a time limit to file a claim for compensation. 7. How can I learn more?  Ask your health care provider.  Call your local or state health department.  Contact the Centers for Disease Control and Prevention (CDC): ? Call 563-514-8086 (1-800-CDC-INFO) or ? Visit CDC's website at http://hunter.com/ Vaccine Information Statement HPV Vaccine (03/24/2018) This information is not intended to replace advice given to you by your health care provider. Make sure you discuss any questions you have with your health care provider. Document Released: 12/07/2013 Document Revised: 08/31/2018 Document Reviewed: 12/22/2017 Elsevier Patient Education  [place] Why am I having this test? BRCA gene testing is done to check for the presence of harmful changes (mutations) in the BRCA1 gene or the  BRCA2 gene (breast cancer susceptibility genes). If there is a mutation, the genes may not be able to help repair damaged cells in the body. As a result, the damaged cells may develop defects that can lead to certain types of cancer. You may have this test if you have a family history of certain types of cancer, including cancer of the:  Breast.  Ovaries.  Fallopian tubes.  Peritoneum.  Pancreas.  Prostate. What kind of sample is taken?     The test requires either a sample of blood or a sample of cells from your saliva. If a sample of blood is needed, it will probably be collected by inserting a needle into a vein. If a sample of saliva is needed, you will get instructions about how to collect the sample. What do the results mean? The test results can show whether you have a mutation in the BRCA1 or BRCA2 gene that increases your risk for certain cancers. Meaning of negative test results A negative test result means that you do not have a mutation in the BRCA1 or BRCA2 gene that is known to increase your risk for certain cancers. This does not mean that you will never get cancer. Talk with your health care provider or a genetic counselor about what this result means for you. Meaning of positive test results A positive test result means that you have a mutation in the BRCA1 or BRCA2 gene that increases your risk for certain cancers. Women with a positive test result have an increased risk for breast and ovarian cancer. Both women and men with a mutation have an increased risk for breast cancer and may be at greater risk for other types of cancer. Getting a positive test result does not mean that you will develop cancer. Talk with your health care provider or a genetic counselor about what this result means for you. You may be told that you are a carrier. This means that you can pass the mutation to your children. Meaning of ambiguous test results Ambiguous, inconclusive, or uncertain test  results mean that there is a change in the BRCA1 or BRCA2 gene, but it is a change that has not been linked to cancer. Talk with your health care provider or a genetic counselor about what this result means for you. Talk with your health care provider to discuss your results, treatment options, and if necessary, the need for more tests. Talk with your health care provider if you have any questions about your results. How do I get my results? It is up to you to get your test results. Ask your health care provider, or the department that is  doing the test, when your results will be ready. This information is not intended to replace advice given to you by your health care provider. Make sure you discuss any questions you have with your health care provider. Document Released: 06/05/2004 Document Revised: 12/07/2017 Document Reviewed: 01/02/2016 Elsevier Patient Education  2020 [employer].

## 2018-12-22 NOTE — Progress Notes (Signed)
NGYN c/o intermittent "pinching" pelvic pain worsening for the past 6 months. Mirena IUD inserted 2 or 3 ago per pt  Pt also c/o feeling nauseous everyday.  Agrees to all STD testing today  Pap due; pt states she has never had a pap smear before GAD = 8

## 2018-12-22 NOTE — Progress Notes (Signed)
GYNECOLOGY ANNUAL PREVENTATIVE CARE ENCOUNTER NOTE  History:     Veronica Buchanan is a 23 y.o. G0P0000 female here to establish care, get a routine annual gynecologic exam and discuss evaluation of chronic pelvic pain.  Has had pelvic pain for several years, pain is intermittent, not related to menstrual cycles, not related to food intake. Has nausea associated with this pain; pain exacerbated by any movement, intercourse. Ameliorated by lying down. Not worse during periods, but not better. Has used OCPs in past, no relief. Has Mirena IUD in place now, no worsening or amelioration of pain since placement. No urinary or bowel associated symptoms. She has had several scans of abdomen and pelvis, no etiology was found. Normal IUD position seen on 2018 scan.   Denies abnormal vaginal bleeding, discharge, other problems with intercourse or other gynecologic concerns.    Gynecologic History Patient's last menstrual period was 12/17/2018. Contraception: IUD Never had pap or received HPV vaccine  Obstetric History OB History  Gravida Para Term Preterm AB Living  0 0 0 0 0 0  SAB TAB Ectopic Multiple Live Births  0 0 0 0 0    Past Medical History:  Diagnosis Date  . Anxiety   . Bipolar disorder ([place])   . Osteochondroma   . Thyroid disease     History reviewed. No pertinent surgical history.  Current Outpatient Medications on File Prior to Visit  Medication Sig Dispense Refill  . albuterol (PROVENTIL HFA;VENTOLIN HFA) 108 (90 BASE) MCG/ACT inhaler Inhale 2 puffs into the lungs every 6 (six) hours as needed for wheezing or shortness of breath.    . beclomethasone (QVAR) 80 MCG/ACT inhaler Inhale 2 puffs into the lungs 2 (two) times daily.    . cetirizine (ZYRTEC) 10 MG tablet Take 10 mg by mouth daily.    . cromolyn (OPTICROM) 4 % ophthalmic solution Place 2 drops into both eyes 4 (four) times daily. 10 mL 2  . fluticasone (FLONASE) 50 MCG/ACT nasal spray Place 1 spray into both nostrils  daily.    . hydrOXYzine (ATARAX/VISTARIL) 25 MG tablet Take 25 mg by mouth 3 (three) times daily as needed.    Marland Kitchen levonorgestrel (MIRENA) 20 MCG/24HR IUD 1 each by Intrauterine route once. Inserted 2017 per pt    . levothyroxine (SYNTHROID, LEVOTHROID) 25 MCG tablet Take 25 mcg by mouth daily before breakfast.    . lisdexamfetamine (VYVANSE) 50 MG capsule Take 50 mg by mouth daily.    . meloxicam (MOBIC) 7.5 MG tablet Take 1 tablet (7.5 mg total) by mouth daily. 15 tablet 0   No current facility-administered medications on file prior to visit.     Allergies  Allergen Reactions  . Latex Itching    burning    Social History:  reports that she has never smoked. She has never used smokeless tobacco. She reports that she does not drink alcohol or use drugs.  Family History  Problem Relation Age of Onset  . Asthma Other   . Heart Problems Mother   . Ovarian cancer Mother 36  . Healthy Father   . Heart Problems Maternal Grandfather     The following portions of the patient's history were reviewed and updated as appropriate: allergies, current medications, past family history, past medical history, past social history, past surgical history and problem list.  Review of Systems Pertinent items noted in HPI and remainder of comprehensive ROS otherwise negative.  Physical Exam:  BP 113/76   Pulse 96   Temp  99.3 F (37.4 C)   Ht 5' 6" (1.676 m)   Wt 158 lb 4.8 oz (71.8 kg)   LMP 12/17/2018   BMI 25.55 kg/m  CONSTITUTIONAL: Well-developed, well-nourished female in no acute distress.  HENT:  Normocephalic, atraumatic, External right and left ear normal. Oropharynx is clear and moist EYES: Conjunctivae and EOM are normal. Pupils are equal, round, and reactive to light. No scleral icterus.  NECK: Normal range of motion, supple, no masses.  Normal thyroid.  SKIN: Skin is warm and dry. No rash noted. Not diaphoretic. No erythema. No pallor. MUSCULOSKELETAL: Normal range of motion. No  tenderness.  No cyanosis, clubbing, or edema.  2+ distal pulses. NEUROLOGIC: Alert and oriented to person, place, and time. Normal reflexes, muscle tone coordination. No cranial nerve deficit noted. PSYCHIATRIC: Normal mood and affect. Normal behavior. Normal judgment and thought content. CARDIOVASCULAR: Normal heart rate noted, regular rhythm RESPIRATORY: Clear to auscultation bilaterally. Effort and breath sounds normal, no problems with respiration noted. BREASTS: Symmetric in size. No masses, skin changes, nipple drainage, or lymphadenopathy. ABDOMEN: Soft, normal bowel sounds, no distention noted.  Diffuse mild tenderness on palpation everywhere especially in lower abdomen, no rebound or guarding.  PELVIC: Normal appearing external genitalia; normal appearing vaginal mucosa and cervix.  IUD strings seen, grey in color, about 1 cm in length outside os.  No abnormal discharge noted.  Pap smear obtained.  Normal uterine size, no other palpable masses, diffuse uterine and bilateral adnexal tenderness on bimanual exam.  Results for orders placed or performed in visit on 12/22/18 (from the past 24 hour(s))  POCT urine pregnancy     Status: None   Collection Time: 12/22/18  2:37 PM  Result Value Ref Range   Preg Test, Ur Negative Negative      Assessment and Plan:    1. FH: ovarian cancer in first degree relative Discussed early diagnosis of ovarian cancer in her mother. Patient will look into getting tested for BRCA.  2. Chronic female pelvic pain Unsure etiology of her pain. Will check for infections and recheck ultrasound.  Talked to patient about diagnostic laparoscopy and evaluation for possible endometriosis. She is interested in this.  The risks of surgery were discussed in detail with the patient including but not limited to: bleeding which may require transfusion or reoperation; infection which may require prolonged hospitalization or re-hospitalization and antibiotic therapy; injury to  bowel, bladder, ureters and major vessels or other surrounding organs; need for additional procedures including laparotomy; thromboembolic phenomenon, incisional problems and other postoperative or anesthesia complications.  Patient was told that the likelihood that her condition and symptoms will be treated effectively with this surgical management was unknown; the postoperative expectations were also discussed in detail. The patient also understands the alternative treatment options which were discussed in full. All questions were answered.  She was told that she will be contacted by our surgical scheduler regarding the time and date of her surgery; routine preoperative instructions will be given to her by the preoperative nursing team.   She is aware of need for preoperative COVID testing and subsequent quarantine from time of test to time of surgery; she will be given further preoperative instructions at that COVID screening visit. Printed patient education handouts about the procedure were given to the patient to review at home.  NSAIDs recommended for pain as needed. - Cervicovaginal ancillary only( [place]) - US PELVIC COMPLETE WITH TRANSVAGINAL; Future  3. Nausea Negative pregnancy test today. Will check CMET -   Comprehensive metabolic panel  4. Need for HPV vaccine Counseled about HPV, she will start series today. - HPV 9-valent vaccine,Recombinat  5. Well woman exam with routine gynecological exam Pap smear and STI screen done today - Cytology - PAP( [place]) - Cervicovaginal ancillary only( [place]) - Hepatitis B surface antigen - Hepatitis C antibody - HIV Antibody (routine testing w rflx) - RPR Will follow up all results and manage accordingly. Mammogram scheduled Routine preventative health maintenance measures emphasized. Please refer to After Visit Summary for other counseling recommendations.      Verita Schneiders, MD, [place] for [employer], [employer]

## 2018-12-22 NOTE — H&P (View-Only) (Signed)
GYNECOLOGY ANNUAL PREVENTATIVE CARE ENCOUNTER NOTE  History:     Ermel Nelon is a 23 y.o. G0P0000 female here to establish care, get a routine annual gynecologic exam and discuss evaluation of chronic pelvic pain.  Has had pelvic pain for several years, pain is intermittent, not related to menstrual cycles, not related to food intake. Has nausea associated with this pain; pain exacerbated by any movement, intercourse. Ameliorated by lying down. Not worse during periods, but not better. Has used OCPs in past, no relief. Has Mirena IUD in place now, no worsening or amelioration of pain since placement. No urinary or bowel associated symptoms. She has had several scans of abdomen and pelvis, no etiology was found. Normal IUD position seen on 2018 scan.   Denies abnormal vaginal bleeding, discharge, other problems with intercourse or other gynecologic concerns.    Gynecologic History Patient's last menstrual period was 12/17/2018. Contraception: IUD Never had pap or received HPV vaccine  Obstetric History OB History  Gravida Para Term Preterm AB Living  0 0 0 0 0 0  SAB TAB Ectopic Multiple Live Births  0 0 0 0 0    Past Medical History:  Diagnosis Date  . Anxiety   . Bipolar disorder ([place])   . Osteochondroma   . Thyroid disease     History reviewed. No pertinent surgical history.  Current Outpatient Medications on File Prior to Visit  Medication Sig Dispense Refill  . albuterol (PROVENTIL HFA;VENTOLIN HFA) 108 (90 BASE) MCG/ACT inhaler Inhale 2 puffs into the lungs every 6 (six) hours as needed for wheezing or shortness of breath.    . beclomethasone (QVAR) 80 MCG/ACT inhaler Inhale 2 puffs into the lungs 2 (two) times daily.    . cetirizine (ZYRTEC) 10 MG tablet Take 10 mg by mouth daily.    . cromolyn (OPTICROM) 4 % ophthalmic solution Place 2 drops into both eyes 4 (four) times daily. 10 mL 2  . fluticasone (FLONASE) 50 MCG/ACT nasal spray Place 1 spray into both nostrils  daily.    . hydrOXYzine (ATARAX/VISTARIL) 25 MG tablet Take 25 mg by mouth 3 (three) times daily as needed.    Marland Kitchen levonorgestrel (MIRENA) 20 MCG/24HR IUD 1 each by Intrauterine route once. Inserted 2017 per pt    . levothyroxine (SYNTHROID, LEVOTHROID) 25 MCG tablet Take 25 mcg by mouth daily before breakfast.    . lisdexamfetamine (VYVANSE) 50 MG capsule Take 50 mg by mouth daily.    . meloxicam (MOBIC) 7.5 MG tablet Take 1 tablet (7.5 mg total) by mouth daily. 15 tablet 0   No current facility-administered medications on file prior to visit.     Allergies  Allergen Reactions  . Latex Itching    burning    Social History:  reports that she has never smoked. She has never used smokeless tobacco. She reports that she does not drink alcohol or use drugs.  Family History  Problem Relation Age of Onset  . Asthma Other   . Heart Problems Mother   . Ovarian cancer Mother 36  . Healthy Father   . Heart Problems Maternal Grandfather     The following portions of the patient's history were reviewed and updated as appropriate: allergies, current medications, past family history, past medical history, past social history, past surgical history and problem list.  Review of Systems Pertinent items noted in HPI and remainder of comprehensive ROS otherwise negative.  Physical Exam:  BP 113/76   Pulse 96   Temp  99.3 F (37.4 C)   Ht _0  (1.676 m)   Wt 158 lb 4.8 oz (71.8 kg)   LMP 12/17/2018   BMI 25.55 kg/m  CONSTITUTIONAL: Well-developed, well-nourished female in no acute distress.  HENT:  Normocephalic, atraumatic, External right and left ear normal. Oropharynx is clear and moist EYES: Conjunctivae and EOM are normal. Pupils are equal, round, and reactive to light. No scleral icterus.  NECK: Normal range of motion, supple, no masses.  Normal thyroid.  SKIN: Skin is warm and dry. No rash noted. Not diaphoretic. No erythema. No pallor. MUSCULOSKELETAL: Normal range of motion. No  tenderness.  No cyanosis, clubbing, or edema.  2+ distal pulses. NEUROLOGIC: Alert and oriented to person, place, and time. Normal reflexes, muscle tone coordination. No cranial nerve deficit noted. PSYCHIATRIC: Normal mood and affect. Normal behavior. Normal judgment and thought content. CARDIOVASCULAR: Normal heart rate noted, regular rhythm RESPIRATORY: Clear to auscultation bilaterally. Effort and breath sounds normal, no problems with respiration noted. BREASTS: Symmetric in size. No masses, skin changes, nipple drainage, or lymphadenopathy. ABDOMEN: Soft, normal bowel sounds, no distention noted.  Diffuse mild tenderness on palpation everywhere especially in lower abdomen, no rebound or guarding.  PELVIC: Normal appearing external genitalia; normal appearing vaginal mucosa and cervix.  IUD strings seen, grey in color, about 1 cm in length outside os.  No abnormal discharge noted.  Pap smear obtained.  Normal uterine size, no other palpable masses, diffuse uterine and bilateral adnexal tenderness on bimanual exam.  Results for orders placed or performed in visit on 12/22/18 (from the past 24 hour(s))  POCT urine pregnancy     Status: None   Collection Time: 12/22/18  2:37 PM  Result Value Ref Range   Preg Test, Ur Negative Negative      Assessment and Plan:    1. FH: ovarian cancer in first degree relative Discussed early diagnosis of ovarian cancer in her mother. Patient will look into getting tested for BRCA.  2. Chronic female pelvic pain Unsure etiology of her pain. Will check for infections and recheck ultrasound.  Talked to patient about diagnostic laparoscopy and evaluation for possible endometriosis. She is interested in this.  The risks of surgery were discussed in detail with the patient including but not limited to: bleeding which may require transfusion or reoperation; infection which may require prolonged hospitalization or re-hospitalization and antibiotic therapy; injury to  bowel, bladder, ureters and major vessels or other surrounding organs; need for additional procedures including laparotomy; thromboembolic phenomenon, incisional problems and other postoperative or anesthesia complications.  Patient was told that the likelihood that her condition and symptoms will be treated effectively with this surgical management was unknown; the postoperative expectations were also discussed in detail. The patient also understands the alternative treatment options which were discussed in full. All questions were answered.  She was told that she will be contacted by our surgical scheduler regarding the time and date of her surgery; routine preoperative instructions will be given to her by the preoperative nursing team.   She is aware of need for preoperative COVID testing and subsequent quarantine from time of test to time of surgery; she will be given further preoperative instructions at that [place] screening visit. Printed patient education handouts about the procedure were given to the patient to review at home.  NSAIDs recommended for pain as needed. - Cervicovaginal ancillary only( ) - US PELVIC COMPLETE WITH TRANSVAGINAL; Future  3. Nausea Negative pregnancy test today. Will check CMET -  Comprehensive metabolic panel  4. Need for HPV vaccine Counseled about HPV, she will start series today. - HPV 9-valent vaccine,Recombinat  5. Well woman exam with routine gynecological exam Pap smear and STI screen done today - Cytology - PAP( [facility]) - Cervicovaginal ancillary only( [facility]) - Hepatitis B surface antigen - Hepatitis C antibody - HIV Antibody (routine testing w rflx) - RPR Will follow up all results and manage accordingly. Mammogram scheduled Routine preventative health maintenance measures emphasized. Please refer to After Visit Summary for other counseling recommendations.      Verita Schneiders, MD, [place] for [facility], [employer]

## 2018-12-23 LAB — COMPREHENSIVE METABOLIC PANEL
ALT: 11 IU/L (ref 0–32)
AST: 16 IU/L (ref 0–40)
Albumin/Globulin Ratio: 2.3 — ABNORMAL HIGH (ref 1.2–2.2)
Albumin: 5.2 g/dL — ABNORMAL HIGH (ref 3.9–5.0)
Alkaline Phosphatase: 94 IU/L (ref 39–117)
BUN/Creatinine Ratio: 11 (ref 9–23)
BUN: 9 mg/dL (ref 6–20)
Bilirubin Total: 0.4 mg/dL (ref 0.0–1.2)
CO2: 20 mmol/L (ref 20–29)
Calcium: 9.7 mg/dL (ref 8.7–10.2)
Chloride: 99 mmol/L (ref 96–106)
Creatinine, Ser: 0.8 mg/dL (ref 0.57–1.00)
GFR calc Af Amer: 121 mL/min/{1.73_m2} (ref >59)
GFR calc non Af Amer: 105 mL/min/{1.73_m2} (ref >59)
Globulin, Total: 2.3 g/dL (ref 1.5–4.5)
Glucose: 84 mg/dL (ref 65–99)
Potassium: 4.5 mmol/L (ref 3.5–5.2)
Sodium: 138 mmol/L (ref 134–144)
Total Protein: 7.5 g/dL (ref 6.0–8.5)

## 2018-12-23 LAB — HIV ANTIBODY (ROUTINE TESTING W REFLEX): HIV Screen 4th Generation wRfx: NONREACTIVE

## 2018-12-23 LAB — HEPATITIS B SURFACE ANTIGEN: Hepatitis B Surface Ag: NEGATIVE

## 2018-12-23 LAB — HEPATITIS C ANTIBODY: Hep C Virus Ab: <0.1 s/co ratio (ref 0.0–0.9)

## 2018-12-23 LAB — RPR: RPR Ser Ql: NONREACTIVE

## 2018-12-24 ENCOUNTER — Encounter (HOSPITAL_BASED_OUTPATIENT_CLINIC_OR_DEPARTMENT_OTHER): Payer: Self-pay | Admitting: *Deleted

## 2018-12-24 ENCOUNTER — Other Ambulatory Visit: Payer: Self-pay

## 2018-12-24 LAB — CERVICOVAGINAL ANCILLARY ONLY
Bacterial vaginitis: NEGATIVE
Candida vaginitis: NEGATIVE
Chlamydia: NEGATIVE
Neisseria Gonorrhea: NEGATIVE
Trichomonas: NEGATIVE

## 2018-12-25 ENCOUNTER — Other Ambulatory Visit (HOSPITAL_COMMUNITY)
Admission: RE | Admit: 2018-12-25 | Discharge: 2018-12-25 | Disposition: A | Discharge status: Home / Self Care | Payer: Medicare Other | Source: Ambulatory Visit | Attending: Obstetrics & Gynecology | Admitting: Obstetrics & Gynecology

## 2018-12-25 DIAGNOSIS — Z20828 Contact with and (suspected) exposure to other viral communicable diseases: Secondary | ICD-10-CM | POA: Diagnosis not present

## 2018-12-25 DIAGNOSIS — Z01812 Encounter for preprocedural laboratory examination: Secondary | ICD-10-CM | POA: Insufficient documentation

## 2018-12-25 LAB — SARS CORONAVIRUS 2 (TAT 6-24 HRS): SARS Coronavirus 2: NEGATIVE

## 2018-12-27 ENCOUNTER — Encounter (HOSPITAL_BASED_OUTPATIENT_CLINIC_OR_DEPARTMENT_OTHER)
Admission: RE | Admit: 2018-12-27 | Discharge: 2018-12-27 | Disposition: A | Discharge status: Home / Self Care | Payer: Medicare Other | Source: Ambulatory Visit | Attending: Obstetrics & Gynecology | Admitting: Obstetrics & Gynecology

## 2018-12-27 ENCOUNTER — Other Ambulatory Visit: Payer: Self-pay

## 2018-12-27 DIAGNOSIS — Z8041 Family history of malignant neoplasm of ovary: Secondary | ICD-10-CM | POA: Diagnosis not present

## 2018-12-27 DIAGNOSIS — Z79890 Hormone replacement therapy (postmenopausal): Secondary | ICD-10-CM | POA: Diagnosis not present

## 2018-12-27 DIAGNOSIS — G8929 Other chronic pain: Secondary | ICD-10-CM | POA: Diagnosis not present

## 2018-12-27 DIAGNOSIS — N803 Endometriosis of pelvic peritoneum: Secondary | ICD-10-CM | POA: Diagnosis not present

## 2018-12-27 DIAGNOSIS — Z791 Long term (current) use of non-steroidal anti-inflammatories (NSAID): Secondary | ICD-10-CM | POA: Diagnosis not present

## 2018-12-27 DIAGNOSIS — R102 Pelvic and perineal pain: Secondary | ICD-10-CM | POA: Diagnosis not present

## 2018-12-27 DIAGNOSIS — E079 Disorder of thyroid, unspecified: Secondary | ICD-10-CM | POA: Diagnosis not present

## 2018-12-27 DIAGNOSIS — R110 Nausea: Secondary | ICD-10-CM | POA: Diagnosis not present

## 2018-12-27 DIAGNOSIS — Z79899 Other long term (current) drug therapy: Secondary | ICD-10-CM | POA: Diagnosis not present

## 2018-12-27 DIAGNOSIS — Z793 Long term (current) use of hormonal contraceptives: Secondary | ICD-10-CM | POA: Diagnosis not present

## 2018-12-27 LAB — CBC
HCT: 39.9 % (ref 36.0–46.0)
Hemoglobin: 12.4 g/dL (ref 12.0–15.0)
MCH: 23.3 pg — ABNORMAL LOW (ref 26.0–34.0)
MCHC: 31.1 g/dL (ref 30.0–36.0)
MCV: 74.9 fL — ABNORMAL LOW (ref 80.0–100.0)
Platelets: 293 10*3/uL (ref 150–400)
RBC: 5.33 MIL/uL — ABNORMAL HIGH (ref 3.87–5.11)
RDW: 13.3 % (ref 11.5–15.5)
WBC: 5.3 10*3/uL (ref 4.0–10.5)
nRBC: 0 % (ref 0.0–0.2)

## 2018-12-27 LAB — CYTOLOGY - PAP
Adequacy: ABSENT
Diagnosis: NEGATIVE

## 2018-12-27 LAB — POCT PREGNANCY, URINE: Preg Test, Ur: NEGATIVE

## 2018-12-29 ENCOUNTER — Encounter (HOSPITAL_BASED_OUTPATIENT_CLINIC_OR_DEPARTMENT_OTHER): Payer: Self-pay | Admitting: *Deleted

## 2018-12-29 ENCOUNTER — Ambulatory Visit (HOSPITAL_BASED_OUTPATIENT_CLINIC_OR_DEPARTMENT_OTHER): Payer: Medicare Other | Admitting: Anesthesiology

## 2018-12-29 ENCOUNTER — Encounter (HOSPITAL_BASED_OUTPATIENT_CLINIC_OR_DEPARTMENT_OTHER)
Admission: RE | Disposition: A | Discharge status: Home / Self Care | Payer: Self-pay | Source: Home / Self Care | Attending: Obstetrics & Gynecology

## 2018-12-29 ENCOUNTER — Ambulatory Visit (HOSPITAL_BASED_OUTPATIENT_CLINIC_OR_DEPARTMENT_OTHER)
Admission: RE | Admit: 2018-12-29 | Discharge: 2018-12-29 | Disposition: A | Discharge status: Home / Self Care | Payer: Medicare Other | Attending: Obstetrics & Gynecology | Admitting: Obstetrics & Gynecology

## 2018-12-29 ENCOUNTER — Other Ambulatory Visit: Payer: Self-pay

## 2018-12-29 DIAGNOSIS — Z791 Long term (current) use of non-steroidal anti-inflammatories (NSAID): Secondary | ICD-10-CM | POA: Insufficient documentation

## 2018-12-29 DIAGNOSIS — R102 Pelvic and perineal pain: Secondary | ICD-10-CM | POA: Insufficient documentation

## 2018-12-29 DIAGNOSIS — Z8041 Family history of malignant neoplasm of ovary: Secondary | ICD-10-CM | POA: Insufficient documentation

## 2018-12-29 DIAGNOSIS — R110 Nausea: Secondary | ICD-10-CM | POA: Diagnosis not present

## 2018-12-29 DIAGNOSIS — Z79890 Hormone replacement therapy (postmenopausal): Secondary | ICD-10-CM | POA: Insufficient documentation

## 2018-12-29 DIAGNOSIS — E079 Disorder of thyroid, unspecified: Secondary | ICD-10-CM | POA: Insufficient documentation

## 2018-12-29 DIAGNOSIS — N803 Endometriosis of pelvic peritoneum: Secondary | ICD-10-CM | POA: Insufficient documentation

## 2018-12-29 DIAGNOSIS — N809 Endometriosis, unspecified: Secondary | ICD-10-CM

## 2018-12-29 DIAGNOSIS — Z79899 Other long term (current) drug therapy: Secondary | ICD-10-CM | POA: Insufficient documentation

## 2018-12-29 DIAGNOSIS — G8929 Other chronic pain: Secondary | ICD-10-CM | POA: Diagnosis not present

## 2018-12-29 DIAGNOSIS — IMO0002 Reserved for concepts with insufficient information to code with codable children: Secondary | ICD-10-CM

## 2018-12-29 DIAGNOSIS — Z793 Long term (current) use of hormonal contraceptives: Secondary | ICD-10-CM | POA: Insufficient documentation

## 2018-12-29 HISTORY — DX: Endometriosis of pelvic peritoneum: N80.3

## 2018-12-29 HISTORY — PX: BIOPSY: SHX5522

## 2018-12-29 HISTORY — DX: Reserved for concepts with insufficient information to code with codable children: IMO0002

## 2018-12-29 HISTORY — PX: LAPAROSCOPY: SHX197

## 2018-12-29 HISTORY — DX: Other chronic pain: G89.29

## 2018-12-29 HISTORY — DX: Endometriosis, unspecified: N80.9

## 2018-12-29 SURGERY — LAPAROSCOPY, DIAGNOSTIC
Anesthesia: General | Site: Abdomen

## 2018-12-29 MED ORDER — FENTANYL CITRATE (PF) 100 MCG/2ML IJ SOLN
INTRAMUSCULAR | Status: AC
  Filled 2018-12-29: qty 2

## 2018-12-29 MED ORDER — LIDOCAINE 2% (20 MG/ML) 5 ML SYRINGE
INTRAMUSCULAR | Status: AC
  Filled 2018-12-29: qty 5

## 2018-12-29 MED ORDER — OXYCODONE-ACETAMINOPHEN 5-325 MG PO TABS: 1.0000 | ORAL_TABLET | ORAL | 0 refills | Status: DC | PRN

## 2018-12-29 MED ORDER — ACETAMINOPHEN 500 MG PO TABS
ORAL_TABLET | ORAL | Status: AC
  Filled 2018-12-29: qty 2

## 2018-12-29 MED ORDER — PROPOFOL 500 MG/50ML IV EMUL
INTRAVENOUS | Status: AC
  Filled 2018-12-29: qty 50

## 2018-12-29 MED ORDER — ONDANSETRON HCL 4 MG/2ML IJ SOLN
INTRAMUSCULAR | Status: DC | PRN
  Administered 2018-12-29: 4 mg via INTRAVENOUS

## 2018-12-29 MED ORDER — GABAPENTIN 300 MG PO CAPS
ORAL_CAPSULE | ORAL | Status: AC
  Filled 2018-12-29: qty 1

## 2018-12-29 MED ORDER — BUPIVACAINE HCL (PF) 0.5 % IJ SOLN
INTRAMUSCULAR | Status: DC | PRN
  Administered 2018-12-29: 10 mL

## 2018-12-29 MED ORDER — LIDOCAINE HCL (CARDIAC) PF 100 MG/5ML IV SOSY
PREFILLED_SYRINGE | INTRAVENOUS | Status: DC | PRN
  Administered 2018-12-29: 100 mg via INTRAVENOUS

## 2018-12-29 MED ORDER — GABAPENTIN 300 MG PO CAPS
300.0000 mg | ORAL_CAPSULE | ORAL | Status: AC
  Administered 2018-12-29: 09:00:00 300 mg via ORAL

## 2018-12-29 MED ORDER — IBUPROFEN 600 MG PO TABS: 600.0000 mg | ORAL_TABLET | Freq: Four times a day (QID) | ORAL | 2 refills | Status: DC | PRN

## 2018-12-29 MED ORDER — EPHEDRINE 5 MG/ML INJ
INTRAVENOUS | Status: AC
  Filled 2018-12-29: qty 10

## 2018-12-29 MED ORDER — KETOROLAC TROMETHAMINE 30 MG/ML IJ SOLN
INTRAMUSCULAR | Status: DC | PRN
  Administered 2018-12-29: 30 mg via INTRAVENOUS

## 2018-12-29 MED ORDER — METOCLOPRAMIDE HCL 5 MG/ML IJ SOLN: 10.0000 mg | Freq: Once | INTRAMUSCULAR | Status: DC | PRN

## 2018-12-29 MED ORDER — ACETAMINOPHEN 500 MG PO TABS
1000.0000 mg | ORAL_TABLET | ORAL | Status: AC
  Administered 2018-12-29: 1000 mg via ORAL

## 2018-12-29 MED ORDER — DIPHENHYDRAMINE HCL 50 MG/ML IJ SOLN
INTRAMUSCULAR | Status: DC | PRN
  Administered 2018-12-29: 12.5 mg via INTRAVENOUS

## 2018-12-29 MED ORDER — LACTATED RINGERS IV SOLN: INTRAVENOUS | Status: DC

## 2018-12-29 MED ORDER — SOD CITRATE-CITRIC ACID 500-334 MG/5ML PO SOLN
ORAL | Status: AC
  Filled 2018-12-29: qty 30

## 2018-12-29 MED ORDER — ONDANSETRON HCL 4 MG/2ML IJ SOLN
INTRAMUSCULAR | Status: AC
  Filled 2018-12-29: qty 2

## 2018-12-29 MED ORDER — PHENYLEPHRINE 40 MCG/ML (10ML) SYRINGE FOR IV PUSH (FOR BLOOD PRESSURE SUPPORT)
PREFILLED_SYRINGE | INTRAVENOUS | Status: AC
  Filled 2018-12-29: qty 10

## 2018-12-29 MED ORDER — SCOPOLAMINE 1 MG/3DAYS TD PT72: 1.0000 | MEDICATED_PATCH | Freq: Once | TRANSDERMAL | Status: DC

## 2018-12-29 MED ORDER — MIDAZOLAM HCL 2 MG/2ML IJ SOLN
INTRAMUSCULAR | Status: AC
  Filled 2018-12-29: qty 2

## 2018-12-29 MED ORDER — DEXAMETHASONE SODIUM PHOSPHATE 10 MG/ML IJ SOLN
INTRAMUSCULAR | Status: AC
  Filled 2018-12-29: qty 1

## 2018-12-29 MED ORDER — SUCCINYLCHOLINE CHLORIDE 200 MG/10ML IV SOSY
PREFILLED_SYRINGE | INTRAVENOUS | Status: AC
  Filled 2018-12-29: qty 10

## 2018-12-29 MED ORDER — MIDAZOLAM HCL 5 MG/5ML IJ SOLN
INTRAMUSCULAR | Status: DC | PRN
  Administered 2018-12-29: 2 mg via INTRAVENOUS

## 2018-12-29 MED ORDER — MEPERIDINE HCL 25 MG/ML IJ SOLN: 6.2500 mg | INTRAMUSCULAR | Status: DC | PRN

## 2018-12-29 MED ORDER — FENTANYL CITRATE (PF) 100 MCG/2ML IJ SOLN
INTRAMUSCULAR | Status: DC | PRN
  Administered 2018-12-29 (×2): 50 ug via INTRAVENOUS

## 2018-12-29 MED ORDER — FENTANYL CITRATE (PF) 100 MCG/2ML IJ SOLN
25.0000 ug | INTRAMUSCULAR | Status: DC | PRN
  Administered 2018-12-29 (×2): 50 ug via INTRAVENOUS

## 2018-12-29 MED ORDER — PROPOFOL 10 MG/ML IV BOLUS
INTRAVENOUS | Status: DC | PRN
  Administered 2018-12-29: 150 mg via INTRAVENOUS

## 2018-12-29 MED ORDER — LACTATED RINGERS IV SOLN
INTRAVENOUS | Status: DC
  Administered 2018-12-29 (×2): via INTRAVENOUS

## 2018-12-29 MED ORDER — DEXAMETHASONE SODIUM PHOSPHATE 4 MG/ML IJ SOLN
INTRAMUSCULAR | Status: DC | PRN
  Administered 2018-12-29: 10 mg via INTRAVENOUS

## 2018-12-29 MED ORDER — SUGAMMADEX SODIUM 200 MG/2ML IV SOLN
INTRAVENOUS | Status: DC | PRN
  Administered 2018-12-29: 200 mg via INTRAVENOUS

## 2018-12-29 MED ORDER — DOCUSATE SODIUM 100 MG PO CAPS: 100.0000 mg | ORAL_CAPSULE | Freq: Two times a day (BID) | ORAL | 2 refills | Status: DC | PRN

## 2018-12-29 MED ORDER — SOD CITRATE-CITRIC ACID 500-334 MG/5ML PO SOLN
30.0000 mL | ORAL | Status: AC
  Administered 2018-12-29: 30 mL via ORAL

## 2018-12-29 MED ORDER — ROCURONIUM BROMIDE 100 MG/10ML IV SOLN
INTRAVENOUS | Status: DC | PRN
  Administered 2018-12-29: 50 mg via INTRAVENOUS

## 2018-12-29 SURGICAL SUPPLY — 31 items
BRIEF STRETCH FOR OB PAD XXL (UNDERPADS AND DIAPERS) ×2 IMPLANT
CABLE HIGH FREQUENCY MONO STRZ (ELECTRODE) IMPLANT
CLOTH BEACON ORANGE TIMEOUT ST (SAFETY) ×2 IMPLANT
COVER MAYO STAND REUSABLE (DRAPES) ×2 IMPLANT
DRSG OPSITE POSTOP 3X4 (GAUZE/BANDAGES/DRESSINGS) IMPLANT
DURAPREP 26ML APPLICATOR (WOUND CARE) ×2 IMPLANT
GLOVE BIOGEL PI IND STRL 7.0 (GLOVE) ×4 IMPLANT
GLOVE BIOGEL PI INDICATOR 7.0 (GLOVE) ×4
GLOVE ECLIPSE 7.0 STRL STRAW (GLOVE) ×2 IMPLANT
GOWN STRL REUS W/TWL LRG LVL3 (GOWN DISPOSABLE) ×4 IMPLANT
NEEDLE INSUFFLATION 120MM (ENDOMECHANICALS) IMPLANT
NS IRRIG 1000ML POUR BTL (IV SOLUTION) ×2 IMPLANT
PACK LAPAROSCOPY BASIN (CUSTOM PROCEDURE TRAY) ×2 IMPLANT
PACK TRENDGUARD 450 HYBRID PRO (MISCELLANEOUS) IMPLANT
PACK TRENDGUARD 600 HYBRD PROC (MISCELLANEOUS) IMPLANT
PAD ARMBOARD 7.5X6 YLW CONV (MISCELLANEOUS) ×4 IMPLANT
PAD PREP 24X48 CUFFED NSTRL (MISCELLANEOUS) ×2 IMPLANT
POUCH SPECIMEN RETRIEVAL 10MM (ENDOMECHANICALS) IMPLANT
SET IRRIG TUBING LAPAROSCOPIC (IRRIGATION / IRRIGATOR) IMPLANT
SHEARS HARMONIC ACE PLUS 36CM (ENDOMECHANICALS) IMPLANT
SLEEVE SCD COMPRESS KNEE MED (MISCELLANEOUS) ×2 IMPLANT
SLEEVE XCEL OPT CAN 5 100 (ENDOMECHANICALS) ×2 IMPLANT
SUT VICRYL 0 UR6 27IN ABS (SUTURE) ×4 IMPLANT
SUT VICRYL 4-0 PS2 18IN ABS (SUTURE) ×2 IMPLANT
TOWEL GREEN STERILE FF (TOWEL DISPOSABLE) ×4 IMPLANT
TRAY FOLEY W/BAG SLVR 14FR LF (SET/KITS/TRAYS/PACK) ×2 IMPLANT
TRENDGUARD 450 HYBRID PRO PACK (MISCELLANEOUS)
TRENDGUARD 600 HYBRID PROC PK (MISCELLANEOUS)
TROCAR XCEL NON-BLD 11X100MML (ENDOMECHANICALS) ×2 IMPLANT
TROCAR XCEL NON-BLD 5MMX100MML (ENDOMECHANICALS) ×2 IMPLANT
WARMER LAPAROSCOPE (MISCELLANEOUS) ×2 IMPLANT

## 2018-12-29 NOTE — Discharge Instructions (Addendum)
Laparoscopic Surgery - Care After Laparoscopy is a surgical procedure. It is used to diagnose and treat diseases inside the belly(abdomen). It is usually a brief, common, and relatively simple procedure. The laparoscopeis a thin, lighted, pencil-sized instrument. It is like a telescope. It is inserted into your abdomen through a small cut (incision). Your caregiver can look at the organs inside your body through this instrument.  She can see if there is anything abnormal. Laparoscopy can be done either in a hospital or outpatient clinic. You may be given a mild sedative to help you relax before the procedure. Once in the operating room, you will be given a drug to make you sleep (general anesthesia). Laparoscopy usually lasts about 1 hour. After the procedure, you will be monitored in a recovery area until you are stable and doing well. Once you are home, it may take 3 to 7 days to fully recover.  Laparoscopy has relatively few risks. Your caregiver will discuss the risks with you before the procedure. Some problems that can occur include: RISKS AND COMPLICATIONS  Allergies to medicines. Difficulty breathing. Bleeding. Infection. Damage to other surrounding structures HOME CARE INSTRUCTIONS  Infection. Bleeding. Damage to other organs. Anesthetic side effects.  Need for additional procedures such as open procedures/laparotomy PROCEDURE Once you receive anesthesia, your surgeon inflates the abdomen with a harmless gas (carbon dioxide). This makes the organs easier to see. The laparoscope is inserted into the abdomen through a small incision. This allows your surgeon to see into the abdomen. Other small instruments are also inserted into the abdomen through other small openings. Many surgeons attach a video camera to the laparoscope to enlarge the view. During a laparoscopy, the surgeon may be looking for inflammation, infection, or cancer.  The surgeon may also need to take out certain organs or  take tissue samples (biopsies). The specimens are sent to a specialist in looking at cells and tissue samples (pathologist). The pathologist examines them under a microscope to help to diagnose or confirm a disease. AFTER THE PROCEDURE  The incisions are closed with stitches (sutures) and Dermabond. Because these incisions are small (usually less than 1/2 inch), there is usually minimal discomfort after the procedure. There may also be discomfort from the instrument placement incisions in the abdomen. You will be given pain medicine to ease any discomfort. You will rest in a recovery room for 1-2 hours until you are stable and doing well. You may have some mild discomfort in the throat. This is from the tube placed in your throat while you were sleeping. You may experience discomfort in the shoulder area from some trapped air between the liver and diaphragm. This sensation is normal and will slowly go away on its own. The recovery time is shortened as long as there are no complications. You will rest in a recovery room until stable and doing well. As long as there are no complications, you may be allowed to go home. Someone will need to drive you home and be with you for at least 24 hours once home. FINDING OUT THE RESULTS You will be called with the results of the pathology and will discuss these results with  your caregiver during your postoperative appointment. Do not assume everything is normal if you have not heard from your caregiver or the medical facility. It is important for you to follow up on all of your results. HOME CARE INSTRUCTIONS  Take all medicines as directed. Only take over-the-counter or prescription medicines for pain, discomfort,   CARE INSTRUCTIONS   Take all medicines as directed.  Only take over-the-counter or prescription medicines for pain, discomfort, or fever as directed by your caregiver.  Resume daily activities as directed.  Showers are preferred over baths.  You may resume sexual activities in 1 week or as directed.  Do not drive while taking  narcotics. SEEK MEDICAL CARE IF:  There is increasing abdominal pain.  You feel lightheaded or faint.  You have the chills.  You have an oral temperature above 102 F (38.9 C).  There is pus-like (purulent) drainage from any of the wounds.  You are unable to pass gas or have a bowel movement.  You feel sick to your stomach (nauseous) or throw up (vomit). MAKE SURE YOU:   Understand these instructions.  Will watch your condition.  Will get help right away if you are not doing well or get worse.  ExitCare Patient Information 2013 [place].   Post Anesthesia Home Care Instructions  Activity: Get plenty of rest for the remainder of the day. A responsible individual must stay with you for 24 hours following the procedure.  For the next 24 hours, DO NOT: -Drive a car -Paediatric nurse -Drink alcoholic beverages -Take any medication unless instructed by your physician -Make any legal decisions or sign important papers.  Meals: Start with liquid foods such as gelatin or soup. Progress to regular foods as tolerated. Avoid greasy, spicy, heavy foods. If nausea and/or vomiting occur, drink only clear liquids until the nausea and/or vomiting subsides. Call your physician if vomiting continues.  Special Instructions/Symptoms: Your throat may feel dry or sore from the anesthesia or the breathing tube placed in your throat during surgery. If this causes discomfort, gargle with warm salt water. The discomfort should disappear within 24 hours.  If you had a scopolamine patch placed behind your ear for the management of post- operative nausea and/or vomiting:  1. The medication in the patch is effective for 72 hours, after which it should be removed.  Wrap patch in a tissue and discard in the trash. Wash hands thoroughly with soap and water. 2. You may remove the patch earlier than 72 hours if you experience unpleasant side effects which may include dry mouth, dizziness or visual  disturbances. 3. Avoid touching the patch. Wash your hands with soap and water after contact with the patch.       No Ibuprofen until 6:00 pm

## 2018-12-29 NOTE — Op Note (Addendum)
Veronica Buchanan PROCEDURE DATE: 12/29/2018  PREOPERATIVE DIAGNOSIS: Chronic pelvic pain POSTOPERATIVE DIAGNOSIS: Stage I Endometriosis PROCEDURE: Diagnostic laparoscopy, peritoneal biopsies SURGEON:  Dr. Verita Schneiders  INDICATIONS: 23 y.o. G0 with history of chronic pelvic pain concerning for endometriosis desiring surgical evaluation.   Please see preoperative notes for further details.  Risks of surgery were discussed with the patient including but not limited to: bleeding which may require transfusion or reoperation; infection which may require antibiotics; injury to bowel, bladder, ureters or other surrounding organs; need for additional procedures including laparotomy; thromboembolic phenomenon, incisional problems and other postoperative/anesthesia complications. Written informed consent was obtained.    FINDINGS:  Small uterus, normal ovaries and fallopian tubes bilaterally.  There were two brown-colored endometriosis lesions in the right posterior cul-de-sac, above and below the uterosacral ligament.  Peritoneal biopsies were taken and sent to pathology. Both lesions were entirely removed and destroyed.  No other abdominal/pelvic abnormality.  Normal upper abdomen.  ANESTHESIA:    General ESTIMATED BLOOD LOSS: Minimal SPECIMENS: Peritoneal biopsies COMPLICATIONS: None immediate  PROCEDURE IN DETAIL:  The patient had sequential compression devices applied to her lower extremities while in the preoperative area.  She was then taken to the operating room where general anesthesia was administered and was found to be adequate.  She was placed in the dorsal lithotomy position, and was prepped and draped in a sterile manner.  A Foley catheter was inserted into her bladder and attached to constant drainage and a uterine manipulator was then advanced into the uterus .  After an adequate timeout was performed, attention was turned to the abdomen where an umbilical incision was made with the scalpel.  The  Optiview 11-mm trocar and sleeve were then advanced without difficulty with the operative laparoscope under direct visualization into the abdomen.  The abdomen was then insufflated with carbon dioxide gas and adequate pneumoperitoneum was obtained.   A detailed survey of the patient's pelvis and abdomen revealed the findings as mentioned above.   Biopsy forceps were used to take peritoneal biopsies of the two sites and to destroy the lesions.  The operative sites were surveyed, and it was found to be hemostatic, no coagulation was used.  No intraoperative injury to surrounding organs was noted.  The abdomen was desufflated and all instruments were then removed from the patient's abdomen. The uterine manipulator was removed without complications.  The fascia of the umbilical incision was closed with 0 Vicryl interrupted stitch, and the skin incision was closed with 4-0 Vicryl. The patient tolerated the procedures well.  All instruments, needles, and sponge counts were correct x 3. The patient was taken to the recovery room in stable condition.   The patient will be discharged to home as per PACU criteria.  Routine postoperative instructions given.  She was prescribed Percocet, Ibuprofen and Colace.  She will follow up in the office in 2-3 weeks for postoperative evaluation.    Verita Schneiders, MD, [place] for [facility], [place]

## 2018-12-29 NOTE — Interval H&P Note (Signed)
History and Physical Interval Note 12/29/2018 8:39 AM  Veronica Buchanan  has presented today for surgery, with the diagnosis of Chronic Pelvic Pain.  The various methods of treatment have been discussed with the patient and family. After consideration of risks, benefits and other options for treatment, the patient has consented to  Procedure(s):  LAPAROSCOPY DIAGNOSTIC (N/A) as a surgical intervention.  The patient's history has been reviewed, patient examined, no change in status, stable for surgery.  I have reviewed the patient's chart and labs.  Questions were answered to the patient's satisfaction.  To OR when ready.   Verita Schneiders, MD, [place] for [facility], [place]

## 2018-12-29 NOTE — Anesthesia Postprocedure Evaluation (Signed)
Anesthesia Post Note  Patient: Catalaya Preece  Procedure(s) Performed: LAPAROSCOPY DIAGNOSTIC (N/A Abdomen) Peritoneal biopsies (N/A Abdomen)     Patient location during evaluation: PACU Anesthesia Type: General Level of consciousness: awake and alert Pain management: pain level controlled Vital Signs Assessment: post-procedure vital signs reviewed and stable Respiratory status: spontaneous breathing, nonlabored ventilation, respiratory function stable and patient connected to nasal cannula oxygen Cardiovascular status: blood pressure returned to baseline and stable Postop Assessment: no apparent nausea or vomiting Anesthetic complications: no    Last Vitals:  Vitals:   12/29/18 1145 12/29/18 1204  BP: 122/64 114/75  Pulse: 93 88  Resp: 18 18  Temp:  37.2 C  SpO2: 99% 100%    Last Pain:  Vitals:   12/29/18 1204  TempSrc: Oral  PainSc: 2                  Montez Hageman

## 2018-12-29 NOTE — Anesthesia Preprocedure Evaluation (Signed)
Anesthesia Evaluation  Patient identified by MRN, date of birth, ID band Patient awake    Reviewed: Allergy & Precautions, NPO status , Patient's Chart, lab work & pertinent test results  Airway Mallampati: II  TM Distance: >3 FB Neck ROM: Full    Dental no notable dental hx.    Pulmonary neg pulmonary ROS,    Pulmonary exam normal breath sounds clear to auscultation       Cardiovascular negative cardio ROS Normal cardiovascular exam Rhythm:Regular Rate:Normal     Neuro/Psych Bipolar Disorder negative neurological ROS     GI/Hepatic negative GI ROS, Neg liver ROS,   Endo/Other  negative endocrine ROS  Renal/GU negative Renal ROS  negative genitourinary   Musculoskeletal negative musculoskeletal ROS (+)   Abdominal   Peds negative pediatric ROS (+)  Hematology negative hematology ROS (+)   Anesthesia Other Findings   Reproductive/Obstetrics negative OB ROS                             Anesthesia Physical Anesthesia Plan  ASA: II  Anesthesia Plan: General   Post-op Pain Management:    Induction: Intravenous  PONV Risk Score and Plan: 4 or greater and Ondansetron, Dexamethasone, Midazolam, Scopolamine patch - Pre-op and Treatment may vary due to age or medical condition  Airway Management Planned: Oral ETT  Additional Equipment:   Intra-op Plan:   Post-operative Plan: Extubation in OR  Informed Consent: I have reviewed the patients History and Physical, chart, labs and discussed the procedure including the risks, benefits and alternatives for the proposed anesthesia with the patient or authorized representative who has indicated his/her understanding and acceptance.     Dental advisory given  Plan Discussed with: CRNA  Anesthesia Plan Comments:         Anesthesia Quick Evaluation

## 2018-12-29 NOTE — Anesthesia Procedure Notes (Signed)
Procedure Name: Intubation Date/Time: 12/29/2018 9:46 AM Performed by: Willa Frater, CRNA Pre-anesthesia Checklist: Patient identified, Emergency Drugs available, Suction available and Patient being monitored Patient Re-evaluated:Patient Re-evaluated prior to induction Oxygen Delivery Method: Circle system utilized Preoxygenation: Pre-oxygenation with 100% oxygen Induction Type: IV induction Ventilation: Mask ventilation without difficulty Tube type: Oral Tube size: 7.0 mm Number of attempts: 1 Airway Equipment and Method: Stylet and Oral airway Placement Confirmation: ETT inserted through vocal cords under direct vision,  positive ETCO2 and breath sounds checked- equal and bilateral Secured at: 21 cm Tube secured with: Tape Dental Injury: Teeth and Oropharynx as per pre-operative assessment

## 2018-12-29 NOTE — Transfer of Care (Signed)
Immediate Anesthesia Transfer of Care Note  Patient: Veronica Buchanan  Procedure(s) Performed: LAPAROSCOPY DIAGNOSTIC (N/A Abdomen) Peritoneal biopsies (N/A Abdomen)  Patient Location: PACU  Anesthesia Type:General  Level of Consciousness: awake, alert  and oriented  Airway & Oxygen Therapy: Patient Spontanous Breathing and Patient connected to face mask oxygen  Post-op Assessment: Report given to RN  Post vital signs: Reviewed and stable  Last Vitals:  Vitals Value Taken Time  BP 137/87 12/29/18 1100  Temp    Pulse 108 12/29/18 1105  Resp 20 12/29/18 1105  SpO2 100 % 12/29/18 1105  Vitals shown include unvalidated device data.  Last Pain:  Vitals:   12/29/18 0901  PainSc: 4          Complications: No apparent anesthesia complications

## 2018-12-30 ENCOUNTER — Encounter (HOSPITAL_BASED_OUTPATIENT_CLINIC_OR_DEPARTMENT_OTHER): Payer: Self-pay | Admitting: Obstetrics & Gynecology

## 2019-01-04 ENCOUNTER — Other Ambulatory Visit: Payer: Self-pay

## 2019-01-04 ENCOUNTER — Ambulatory Visit (HOSPITAL_COMMUNITY)
Admission: RE | Admit: 2019-01-04 | Discharge: 2019-01-04 | Disposition: A | Discharge status: Home / Self Care | Payer: Medicare Other | Source: Ambulatory Visit | Attending: Obstetrics & Gynecology | Admitting: Obstetrics & Gynecology

## 2019-01-04 DIAGNOSIS — R102 Pelvic and perineal pain: Secondary | ICD-10-CM | POA: Diagnosis present

## 2019-01-04 DIAGNOSIS — G8929 Other chronic pain: Secondary | ICD-10-CM

## 2019-01-13 ENCOUNTER — Ambulatory Visit (INDEPENDENT_AMBULATORY_CARE_PROVIDER_SITE_OTHER): Payer: Medicare Other | Admitting: Obstetrics and Gynecology

## 2019-01-13 ENCOUNTER — Other Ambulatory Visit: Payer: Self-pay

## 2019-01-13 ENCOUNTER — Encounter: Payer: Self-pay | Admitting: Obstetrics and Gynecology

## 2019-01-13 VITALS — BP 108/69 | HR 85 | Temp 98.5°F | Ht 66.0 in | Wt 158.5 lb

## 2019-01-13 DIAGNOSIS — Z9889 Other specified postprocedural states: Secondary | ICD-10-CM

## 2019-01-13 DIAGNOSIS — IMO0002 Reserved for concepts with insufficient information to code with codable children: Secondary | ICD-10-CM

## 2019-01-13 DIAGNOSIS — N803 Endometriosis of pelvic peritoneum: Secondary | ICD-10-CM | POA: Diagnosis not present

## 2019-01-13 NOTE — Progress Notes (Signed)
GYNECOLOGY OFFICE FOLLOW UP NOTE  History:  23 y.o. G0P0000 here today for follow up for diagnostic laparoscopy with peritoneal biopsies for evaluation of endometriosis done by Dr. Harolyn Rutherford on 12/29/18. She is doing very well, has some soreness with bending over that is improving as the days go on. No real pain since several days after surgery. Slight nausea that has improved. Now eating, drinking regularly. Having regular bowel movements and voiding without issue.   Reports pain that was present prior to surgery has not recurred.   Past Medical History:  Diagnosis Date  . Anxiety   . Bipolar disorder ([place])   . 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 Stage I.  . Osteochondroma   . Thyroid disease     Past Surgical History:  Procedure Laterality Date  . BIOPSY N/A 12/29/2018   Procedure: Peritoneal biopsies;  Surgeon: Osborne Oman, MD;  Location: [place];  Service: Gynecology;  Laterality: N/A;  . LAPAROSCOPY N/A 12/29/2018   Procedure: LAPAROSCOPY DIAGNOSTIC;  Surgeon: Osborne Oman, MD;  Location: [place];  Service: Gynecology;  Laterality: N/A;     Current Outpatient Medications:  .  albuterol (PROVENTIL HFA;VENTOLIN HFA) 108 (90 BASE) MCG/ACT inhaler, Inhale 2 puffs into the lungs every 6 (six) hours as needed for wheezing or shortness of breath., Disp: , Rfl:  .  beclomethasone (QVAR) 80 MCG/ACT inhaler, Inhale 2 puffs into the lungs 2 (two) times daily., Disp: , Rfl:  .  cetirizine (ZYRTEC) 10 MG tablet, Take 10 mg by mouth daily., Disp: , Rfl:  .  cromolyn (OPTICROM) 4 % ophthalmic solution, Place 2 drops into both eyes 4 (four) times daily., Disp: 10 mL, Rfl: 2 .  docusate sodium (COLACE) 100 MG capsule, Take 1 capsule (100 mg total) by mouth 2 (two) times daily as needed for mild constipation or moderate constipation., Disp: 30 capsule, Rfl: 2 .  doxepin (SINEQUAN) 25 MG  capsule, Take 25 mg by mouth., Disp: , Rfl:  .  fluticasone (FLONASE) 50 MCG/ACT nasal spray, Place 1 spray into both nostrils daily., Disp: , Rfl:  .  ibuprofen (ADVIL) 600 MG tablet, Take 1 tablet (600 mg total) by mouth every 6 (six) hours as needed for headache, mild pain, moderate pain or cramping., Disp: 30 tablet, Rfl: 2 .  levonorgestrel (MIRENA) 20 MCG/24HR IUD, 1 each by Intrauterine route once. Inserted 2017 per pt, Disp: , Rfl:  .  levothyroxine (SYNTHROID, LEVOTHROID) 25 MCG tablet, Take 25 mcg by mouth daily before breakfast., Disp: , Rfl:  .  lisdexamfetamine (VYVANSE) 50 MG capsule, Take 50 mg by mouth daily., Disp: , Rfl:  .  oxyCODONE-acetaminophen (PERCOCET/ROXICET) 5-325 MG tablet, Take 1 tablet by mouth every 4 (four) hours as needed for severe pain., Disp: 30 tablet, Rfl: 0 .  sertraline (ZOLOFT) 50 MG tablet, Take 50 mg by mouth daily., Disp: , Rfl:  .  ziprasidone (GEODON) 20 MG capsule, Take 20 mg by mouth 2 (two) times daily with a meal., Disp: , Rfl:   The following portions of the patient's history were reviewed and updated as appropriate: allergies, current medications, past family history, past medical history, past social history, past surgical history and problem list.   Review of Systems:  Pertinent items noted in HPI and remainder of comprehensive ROS otherwise negative.   Objective:  Physical Exam BP 108/69   Pulse 85   Temp 98.5 F (36.9 C)  Ht 5\' 6"  (1.676 m)   Wt 158 lb 8 oz (71.9 kg)   LMP 12/17/2018 (Exact Date)   BMI 25.58 kg/m  CONSTITUTIONAL: Well-developed, well-nourished female in no acute distress.  HENT:  Normocephalic, atraumatic. External right and left ear normal. Oropharynx is clear and moist EYES: Conjunctivae and EOM are normal. Pupils are equal, round, and reactive to light. No scleral icterus.  NECK: Normal range of motion, supple, no masses SKIN: Skin is warm and dry. No rash noted. Not diaphoretic. No erythema. No  pallor. NEUROLOGIC: Alert and oriented to person, place, and time. Normal reflexes, muscle tone coordination. No cranial nerve deficit noted. PSYCHIATRIC: Normal mood and affect. Normal behavior. Normal judgment and thought content. CARDIOVASCULAR: Normal heart rate noted RESPIRATORY: Effort normal, no problems with respiration noted ABDOMEN: Soft, no distention noted. Well healed umbilical port with scabbing, no erythema or oozing. Mild tenderness throughout abdomen. Noted burn of skin in shape of adhesive around umbilical port. PELVIC: deferred. MUSCULOSKELETAL: Normal range of motion. No edema noted.  Labs and Imaging Path Diagnosis Peritoneum, biopsy - ENDOMETRIOSIS. - NO EVIDENCE OF MALIGNANCY. Claudette Laws MD Pathologist, Electronic Signature (Case signed 12/30/2018)   Assessment & Plan:   1. Post-operative state Doing well To use aloe for adhesive tape burn, adhesive added to allergy list  2. Endometriosis of pelvis Reviewed endometriosis, need for life-long treatment, potential worsening/improvement of disease, implications for fertility, options for management. She is doing well on Lng-IUD and states she has not had that same pain since. Recommend continuing IUD management for now as it adds bonus of contraception (patient reports when she used OCPs in past, she could never remember to take them), she is agreeable to this plna - return 3 months if pain returns, 6 months if doing well   Routine preventative health maintenance measures emphasized. Please refer to After Visit Summary for other counseling recommendations.   Return in about 3 months (around 04/15/2019), or if symptoms worsen or fail to improve.  Total face-to-face time with patient: 15 minutes. Over 50% of encounter was spent on counseling and coordination of care.  Feliz Beam, M.D. Attending [facility] [employer] [facility])

## 2019-02-09 ENCOUNTER — Ambulatory Visit: Payer: Medicare Other | Admitting: Obstetrics & Gynecology

## 2019-02-22 ENCOUNTER — Ambulatory Visit: Payer: Medicare Other

## 2019-02-28 ENCOUNTER — Ambulatory Visit: Payer: Medicare Other | Admitting: Obstetrics & Gynecology

## 2019-02-28 NOTE — Progress Notes (Deleted)
   Patient did not show up today for her scheduled appointment.   Jenette Rayson, MD, FACOG Obstetrician & Gynecologist, [facility], [facility]  

## 2019-03-06 ENCOUNTER — Ambulatory Visit (HOSPITAL_COMMUNITY)
Admission: EM | Admit: 2019-03-06 | Discharge: 2019-03-06 | Disposition: A | Discharge status: Home / Self Care | Payer: Medicare Other | Attending: Emergency Medicine | Admitting: Emergency Medicine

## 2019-03-06 ENCOUNTER — Other Ambulatory Visit: Payer: Self-pay

## 2019-03-06 ENCOUNTER — Encounter (HOSPITAL_COMMUNITY): Payer: Self-pay | Admitting: Emergency Medicine

## 2019-03-06 ENCOUNTER — Ambulatory Visit (INDEPENDENT_AMBULATORY_CARE_PROVIDER_SITE_OTHER): Payer: Medicare Other

## 2019-03-06 DIAGNOSIS — M79675 Pain in left toe(s): Secondary | ICD-10-CM

## 2019-03-06 MED ORDER — IBUPROFEN 800 MG PO TABS: 800.0000 mg | ORAL_TABLET | Freq: Three times a day (TID) | ORAL | 0 refills | Status: DC | PRN

## 2019-03-06 NOTE — ED Provider Notes (Signed)
[place]    CSN: [id number] Arrival date & time: 03/06/19  1214      History   Chief Complaint Chief Complaint  Patient presents with  . Appointment    1220  . Toe Pain    HPI Veronica Buchanan is a 23 y.o. female.   Patient presents with pain in her left foot and toes after striking it on her bed frame during the night.  She denies other injury.  LMP: 1 week.  The history is provided by the patient.    Past Medical History:  Diagnosis Date  . Anxiety   . Bipolar disorder ([place])   . 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 Stage I.  . Osteochondroma   . Thyroid disease     Patient Active Problem List   Diagnosis Date Noted  . Chronic female pelvic pain 12/29/2018  . Endometriosis of pelvis 12/29/2018    Past Surgical History:  Procedure Laterality Date  . BIOPSY N/A 12/29/2018   Procedure: Peritoneal biopsies;  Surgeon: Osborne Oman, MD;  Location: [place];  Service: Gynecology;  Laterality: N/A;  . LAPAROSCOPY N/A 12/29/2018   Procedure: LAPAROSCOPY DIAGNOSTIC;  Surgeon: Osborne Oman, MD;  Location: [place];  Service: Gynecology;  Laterality: N/A;    OB History    Gravida  0   Para  0   Term  0   Preterm  0   AB  0   Living  0     SAB  0   TAB  0   Ectopic  0   Multiple  0   Live Births  0            Home Medications    Prior to Admission medications   Medication Sig Start Date End Date Taking? Authorizing Provider  albuterol (PROVENTIL HFA;VENTOLIN HFA) 108 (90 BASE) MCG/ACT inhaler Inhale 2 puffs into the lungs every 6 (six) hours as needed for wheezing or shortness of breath.    [provider]  beclomethasone (QVAR) 80 MCG/ACT inhaler Inhale 2 puffs into the lungs 2 (two) times daily.    [provider]  cetirizine (ZYRTEC) 10 MG tablet Take 10 mg by mouth daily.    [provider]  cromolyn  (OPTICROM) 4 % ophthalmic solution Place 2 drops into both eyes 4 (four) times daily. 09/21/16   Barnet Glasgow, NP  docusate sodium (COLACE) 100 MG capsule Take 1 capsule (100 mg total) by mouth 2 (two) times daily as needed for mild constipation or moderate constipation. 12/29/18   Anyanwu, Sallyanne Havers, MD  doxepin (SINEQUAN) 25 MG capsule Take 25 mg by mouth.    Joline Salt, RN  fluticasone [facility]) 50 MCG/ACT nasal spray Place 1 spray into both nostrils daily.    [provider]  ibuprofen (ADVIL) 800 MG tablet Take 1 tablet (800 mg total) by mouth every 8 (eight) hours as needed. 03/06/19   Sharion Balloon, NP  levonorgestrel (MIRENA) 20 MCG/24HR IUD 1 each by Intrauterine route once. Inserted 2017 per pt    [provider]  levothyroxine (SYNTHROID, LEVOTHROID) 25 MCG tablet Take 25 mcg by mouth daily before breakfast.    [provider]  lisdexamfetamine (VYVANSE) 50 MG capsule Take 50 mg by mouth daily.    [provider]  oxyCODONE-acetaminophen (PERCOCET/ROXICET) 5-325 MG tablet Take 1 tablet by mouth every 4 (four) hours as needed for  severe pain. Patient not taking: Reported on 03/06/2019 12/29/18   Osborne Oman, MD  sertraline (ZOLOFT) 50 MG tablet Take 50 mg by mouth daily.    Joline Salt, RN  ziprasidone (GEODON) 20 MG capsule Take 20 mg by mouth 2 (two) times daily with a meal.    Joline Salt, RN    Family History Family History  Problem Relation Age of Onset  . Asthma Other   . Heart Problems Mother   . Ovarian cancer Mother 36  . Healthy Father   . Heart Problems Maternal Grandfather     Social History Social History   Tobacco Use  . Smoking status: Never Smoker  . Smokeless tobacco: Never Used  Substance Use Topics  . Alcohol use: Yes    Comment: occasional (2 mos ago)  . Drug use: Yes    Types: Marijuana    Comment: one month ago     Allergies   Adhesive [tape], Shellfish allergy, Latex, and Peanut-containing  drug products   Review of Systems Review of Systems  Constitutional: Negative for chills and fever.  HENT: Negative for ear pain and sore throat.   Eyes: Negative for pain and visual disturbance.  Respiratory: Negative for cough and shortness of breath.   Cardiovascular: Negative for chest pain and palpitations.  Gastrointestinal: Negative for abdominal pain and vomiting.  Genitourinary: Negative for dysuria and hematuria.  Musculoskeletal: Positive for arthralgias. Negative for back pain.  Skin: Negative for color change and rash.  Neurological: Negative for seizures and syncope.  All other systems reviewed and are negative.    Physical Exam Triage Vital Signs ED Triage Vitals  Enc Vitals Group     BP      Pulse      Resp      Temp      Temp src      SpO2      Weight      Height      Head Circumference      Peak Flow      Pain Score      Pain Loc      Pain Edu?      Excl. in [place]?    No data found.  Updated Vital Signs BP 104/70 (BP Location: Right Arm)   Pulse 86   Temp 97.8 F (36.6 C) (Oral)   Resp 18   LMP 02/23/2019 (Exact Date)   SpO2 95%   Visual Acuity Right Eye Distance:   Left Eye Distance:   Bilateral Distance:    Right Eye Near:   Left Eye Near:    Bilateral Near:     Physical Exam Vitals signs and nursing note reviewed.  Constitutional:      General: She is not in acute distress.    Appearance: She is well-developed.  HENT:     Head: Normocephalic and atraumatic.  Eyes:     Conjunctiva/sclera: Conjunctivae normal.  Neck:     Musculoskeletal: Neck supple.  Cardiovascular:     Rate and Rhythm: Normal rate and regular rhythm.     Heart sounds: No murmur.  Pulmonary:     Effort: Pulmonary effort is normal. No respiratory distress.     Breath sounds: Normal breath sounds.  Abdominal:     Palpations: Abdomen is soft.     Tenderness: There is no abdominal tenderness.  Musculoskeletal:        General: Tenderness present. No swelling  or deformity.  Feet:  Feet:     Comments: Tender to palpation. Skin:    General: Skin is warm and dry.     Capillary Refill: Capillary refill takes less than 2 seconds.     Findings: No bruising, erythema, lesion or rash.  Neurological:     General: No focal deficit present.     Mental Status: She is alert and oriented to person, place, and time.     Sensory: No sensory deficit.     Motor: No weakness.      UC Treatments / Results  Labs (all labs ordered are listed, but only abnormal results are displayed) Labs Reviewed - No data to display  EKG   Radiology No results found.  Procedures Procedures (including critical care time)  Medications Ordered in UC Medications - No data to display  Initial Impression / Assessment and Plan / UC Course  I have reviewed the triage vital signs and the nursing notes.  Pertinent labs & imaging results that were available during my care of the patient were reviewed by me and considered in my medical decision making (see chart for details).   Left foot pain, specifically the left fourth toe.  X-ray negative.  Treating with ibuprofen, buddy taping toes, postop shoe.  Instructed patient to rest and elevate her foot.  Instructed her to apply ice packs as directed.  Discussed that if her pain continues she can follow-up with her primary care provider or the orthopedic suggested.  Patient agrees to plan of care.     Final Clinical Impressions(s) / UC Diagnoses   Final diagnoses:  Pain of toe of left foot     Discharge Instructions     Keep your toes buddy-taped and wear the Ortho shoe as needed for comfort.    Take ibuprofen as needed for the discomfort.    Keep your foot elevated.  Apply ice packs 2-3 times a day for up to 20 minutes each.    Follow-up with the orthopedic listed below if your pain is not improving or gets worse.        ED Prescriptions    Medication Sig Dispense Auth. Provider   ibuprofen (ADVIL) 800  MG tablet Take 1 tablet (800 mg total) by mouth every 8 (eight) hours as needed. 21 tablet Sharion Balloon, NP     PDMP not reviewed this encounter.   Sharion Balloon, NP 03/06/19 1341

## 2019-03-06 NOTE — ED Triage Notes (Signed)
Pt here for left foot and toe pain on last three toes after hitting in bed frame yesterday

## 2019-03-06 NOTE — Discharge Instructions (Addendum)
Keep your toes buddy-taped and wear the Ortho shoe as needed for comfort.    Take ibuprofen as needed for the discomfort.    Keep your foot elevated.  Apply ice packs 2-3 times a day for up to 20 minutes each.    Follow-up with the orthopedic listed below if your pain is not improving or gets worse.

## 2019-03-15 ENCOUNTER — Encounter: Payer: Self-pay | Admitting: Family Medicine

## 2019-03-15 ENCOUNTER — Other Ambulatory Visit: Payer: Self-pay

## 2019-03-15 ENCOUNTER — Ambulatory Visit (INDEPENDENT_AMBULATORY_CARE_PROVIDER_SITE_OTHER): Payer: Medicare Other | Admitting: Family Medicine

## 2019-03-15 VITALS — BP 114/75 | HR 98 | Wt 160.0 lb

## 2019-03-15 DIAGNOSIS — N803 Endometriosis of pelvic peritoneum: Secondary | ICD-10-CM | POA: Diagnosis not present

## 2019-03-15 DIAGNOSIS — F419 Anxiety disorder, unspecified: Secondary | ICD-10-CM

## 2019-03-15 DIAGNOSIS — F319 Bipolar disorder, unspecified: Secondary | ICD-10-CM

## 2019-03-15 DIAGNOSIS — IMO0002 Reserved for concepts with insufficient information to code with codable children: Secondary | ICD-10-CM

## 2019-03-15 DIAGNOSIS — R109 Unspecified abdominal pain: Secondary | ICD-10-CM | POA: Diagnosis not present

## 2019-03-15 DIAGNOSIS — Z23 Encounter for immunization: Secondary | ICD-10-CM | POA: Diagnosis not present

## 2019-03-15 NOTE — Progress Notes (Signed)
RGYN Patient presents for problem visit today.  CC: Abdominal pain onset 01/14/19 . Pain is off and on 08/10x. Relief w/ heating pads and IBP 800mg .  Pt notes normal BM last BM this morning . Pt also notes nausea associated w/ pain. Pt states she is eating 1-2 meals a day but discomfort makes it hard to eat.    Contraception : IUD Mirena  LMP: 02/23/19 per pt  STD Screening : Declined

## 2019-03-15 NOTE — Patient Instructions (Signed)
Coping With Anxiety Anxiety is the feeling of nervousness or worry that you might experience when faced with a stressful event, like a test or a big sports game. Occasional stress and anxiety caused by work, school, relationships, or decision-making is a normal part of life, and it can be managed through certain lifestyle habits. However, some people may experience anxiety:  Without a specific trigger.  For long periods of time.  That causes physical problems over time.  That is far more intense than typical stress. When these feelings become overwhelming and interfere with daily activities and relationships, it may indicate an anxiety disorder. If you receive a diagnosis of an anxiety disorder, your health care provider will tell you which type of anxiety you have and the possible treatments to help. How can anxiety affect me? Anxiety may make you feel uncomfortable. When you are faced with something exciting or potentially dangerous, your body responds in a way that prepares it to fight or run away. This response, called "fight or flight," is also a normal response to stress. When your brain initiates the fight and flight response, it tells your body to get the blood moving and prepare for the demands of the expected challenge. When this happens, you may experience:  A faster than usual heart rate.  Blood flowing to your big muscles  A feeling of tension and focus. In some situations, such as during a big game or performance, this response a good thing and can help you perform better. However, in most situations, this response is not helpful. When the fight and flight response lasts for hours or days, it may cause:  Tiredness or exhaustion.  Sleep problems.  Upset stomach or nausea.  Headache.  Feelings of depression. Long-term anxiety may also cause you to:  Think negative thoughts about yourself.  Experience problems and conflicts in relationships.  Distance yourself from  friends, family, and activities you enjoy.  Perform poorly in school, sports, work or extracurricular activities. What are things that I can do to deal with anxiety? When you experience anxiety, you can take steps to help manage it:  Talk with a trusted friend or family member about your thoughts and feelings. Identify two or three people who you think might help.  Find an activity that helps calm you down, such as: ? Deep breathing. ? Listening to music. ? Taking a walk. ? Exercising. ? Playing sports for fun. ? Playing an instrument. ? Singing. ? Writing in a dairy. ? Drawing.  Watch a funny movie.  Read a good book.  Spend time with friends. What should I do if my anxiety gets worse? If these self-calming methods are not working or if your anxiety gets worse, you should get help from a health care provider. Talking with your health care provider or a mental health counselor is not a sign of weakness. Certain types of counseling can be very helpful in treating anxiety. A counseling professional can assess what other types of treatments could be most helpful for you. Other treatments include:  Talk therapy.  Medicines.  Biofeedback.  Meditation.  Yoga. Talk with your health care provider or counselor about what treatment options are right for you. Where can I get support? You may find that joining a support group helps you deal with your anxiety. Resources for locating counselors or support groups in your area are available from the following sources:  [place]: www.mentalhealthamerica.net  Anxiety and Depression Association of [place] (ADAA): https://www.clark.net/  [employer]  Alliance on Mental Illness (NAMI): www.nami.org This information is not intended to replace advice given to you by your health care provider. Make sure you discuss any questions you have with your health care provider. Document Released: 04/07/2016 Document Revised: 04/24/2017 Document Reviewed:  04/07/2016 Elsevier Patient Education  [place]. Endometriosis  Endometriosis is a condition in which the tissue that lines the uterus (endometrium) grows outside of its normal location. The tissue may grow in many locations close to the uterus, but it commonly grows on the ovaries, fallopian tubes, vagina, or bowel. When the uterus sheds the endometrium every menstrual cycle, there is bleeding wherever the endometrial tissue is located. This can cause pain because blood is irritating to tissues that are not normally exposed to it. What are the causes? The cause of endometriosis is not known. What increases the risk? You may be more likely to develop endometriosis if you:  Have a family history of endometriosis.  Have never given birth.  Started your period at age 30 or younger.  Have high levels of estrogen in your body.  Were exposed to a certain medicine (diethylstilbestrol) before you were born (in utero).  Had low birth weight.  Were born as a twin, triplet, or other multiple.  Have a BMI of less than 25. BMI is an estimate of body fat and is calculated from height and weight. What are the signs or symptoms? Often, there are no symptoms of this condition. If you do have symptoms, they may:  Vary depending on where your endometrial tissue is growing.  Occur during your menstrual period (most common) or midcycle.  Come and go, or you may go months with no symptoms at all.  Stop with menopause. Symptoms may include:  Pain in the back or abdomen.  Heavier bleeding during periods.  Pain during sex.  Painful bowel movements.  Infertility.  Pelvic pain.  Bleeding more than once a month. How is this diagnosed? This condition is diagnosed based on your symptoms and a physical exam. You may have tests, such as:  Blood tests and urine tests. These may be done to help rule out other possible causes of your symptoms.  Ultrasound, to look for abnormal tissues.   An X-ray of the lower bowel (barium enema).  An ultrasound that is done through the vagina (transvaginally).  CT scan.  MRI.  Laparoscopy. In this procedure, a lighted, pencil-sized instrument called a laparoscope is inserted into your abdomen through an incision. The laparoscope allows your health care provider to look at the organs inside your body and check for abnormal tissue to confirm the diagnosis. If abnormal tissue is found, your health care provider may remove a small piece of tissue (biopsy) to be examined under a microscope. How is this treated? Treatment for this condition may include:  Medicines to relieve pain, such as NSAIDs.  Hormone therapy. This involves using artificial (synthetic) hormones to reduce endometrial tissue growth. Your health care provider may recommend using a hormonal form of birth control, or other medicines.  Surgery. This may be done to remove abnormal endometrial tissue. ? In some cases, tissue may be removed using a laparoscope and a laser (laparoscopic laser treatment). ? In severe cases, surgery may be done to remove the fallopian tubes, uterus, and ovaries (hysterectomy). Follow these instructions at home:  Take over-the-counter and prescription medicines only as told by your health care provider.  Do not drive or use heavy machinery while taking prescription pain medicine.  Try to  avoid activities that cause pain, including sexual activity.  Keep all follow-up visits as told by your health care provider. This is important. Contact a health care provider if:  You have pain in the area between your hip bones (pelvic area) that occurs: ? Before, during, or after your period. ? In between your period and gets worse during your period. ? During or after sex. ? With bowel movements or urination, especially during your period.  You have problems getting pregnant.  You have a fever. Get help right away if:  You have severe pain that does  not get better with medicine.  You have severe nausea and vomiting, or you cannot eat without vomiting.  You have pain that affects only the lower, right side of your abdomen.  You have abdominal pain that gets worse.  You have abdominal swelling.  You have blood in your stool. This information is not intended to replace advice given to you by your health care provider. Make sure you discuss any questions you have with your health care provider. Document Released: 05/09/2000 Document Revised: 04/24/2017 Document Reviewed: 10/13/2015 Elsevier Patient Education  2020 [employer].

## 2019-03-15 NOTE — Progress Notes (Signed)
GYNECOLOGY OFFICE VISIT NOTE  History:   Veronica Buchanan is a 23 y.o. G0P0 here today for abdominal pain. She denies any abnormal vaginal discharge, bleeding. Patient had laparoscopic evaluation for endometriosis on 8/5 and pathology consistent with endometriosis. IUD in place. Periods have been more regular since surgery. Reports continued abdominal/pelvic pain along with nausea. Nausea is daily. Patient still able to tolerate PO well. Abdominal pain located in epigastric region as well as RLQ and LLQ. Pain is about once a day for less than 15 minutes and improved with ibuprofen and Tylenol. Reports 30 lb weight gain over past year. Reports possible history of thyroid problems and has not had level checked in a long time. Reports a lot of anxiety and that she takes ziprasidone and atarax along with Vyvanse. She has panic attacks a few times a week. Unsure when next psych appt is. Reports symptoms not related to types of foods or when she eats. Denies hematochezia, melena, constipation, diarrhea. Denies concern for STD or vaginal infection. No abdominal pain today.  Past Medical History:  Diagnosis Date  . Anxiety   . Bipolar disorder ([place])   . 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 Stage I.  . Osteochondroma   . Thyroid disease     Past Surgical History:  Procedure Laterality Date  . BIOPSY N/A 12/29/2018   Procedure: Peritoneal biopsies;  Surgeon: Osborne Oman, MD;  Location: [facility];  Service: Gynecology;  Laterality: N/A;  . LAPAROSCOPY N/A 12/29/2018   Procedure: LAPAROSCOPY DIAGNOSTIC;  Surgeon: Osborne Oman, MD;  Location: [facility];  Service: Gynecology;  Laterality: N/A;    The following portions of the patient's history were reviewed and updated as appropriate: allergies, current medications, past family history, past medical history, past social history, past surgical history and  problem list.   Health Maintenance:  Normal pap and negative HRHPV on 12/22/2018.   Review of Systems:  Pertinent items noted in HPI and remainder of comprehensive ROS otherwise negative.  Physical Exam:  BP 114/75   Pulse 98   Wt 160 lb (72.6 kg)   LMP 02/23/2019 (Exact Date)   BMI 25.82 kg/m  CONSTITUTIONAL: Well-developed, well-nourished female in no acute distress.  HEENT:  Normocephalic, atraumatic. External right and left ear normal. No scleral icterus.  NECK: Normal range of motion, supple, no masses noted on observation SKIN: No rash noted. Not diaphoretic. No erythema. No pallor. MUSCULOSKELETAL: Normal range of motion. No edema noted. NEUROLOGIC: Alert and oriented to person, place, and time. Normal muscle tone coordination. No cranial nerve deficit noted. PSYCHIATRIC: Normal mood and affect. Normal behavior. Normal judgment and thought content. CARDIOVASCULAR: Normal heart rate noted RESPIRATORY: Effort and breath sounds normal, no problems with respiration noted ABDOMEN: No masses noted. No other overt distention noted. Soft. No   Labs and Imaging No results found for this or any previous visit (from the past 168 hour(s)). Dg Foot Complete Left  Result Date: 03/06/2019 CLINICAL DATA:  The patient struck her left foot on a bed post last night. Pain. Initial encounter. EXAM: LEFT FOOT - COMPLETE 3+ VIEW COMPARISON:  None. FINDINGS: There is no evidence of fracture or dislocation. There is no evidence of arthropathy or other focal bone abnormality. Soft tissues are unremarkable. IMPRESSION: Normal exam. Electronically Signed   By: Inge Rise M.D.   On: 03/06/2019 13:37      Assessment and Plan:  Veronica Buchanan was seen  today for pelvic pain and nausea.  Diagnoses and all orders for this visit:  Endometriosis of pelvis Bipolar 1 disorder ([place]) Abdominal pain, unspecified abdominal location Anxiety -     T4 AND TSH - Unclear etiology for pain but likely related to  anxiety and bipolar disorder. Appears to happen in the afternoon when patient is waiting for boyfriend to come home. Will check TSH level. Already taking mood stabilizer. No evidence of worsening endometriosis as patient with regular periods and pain not worse during menstrual cycles. IUD in place. Pain < 15 min each day and easily controlled with NSAID or tylenol.  - Advised to f/u with PCP and psychiatry.   Need for HPV vaccine -     HPV 9-valent vaccine,Recombinat   Routine preventative health maintenance measures emphasized. Please refer to After Visit Summary for other counseling recommendations.   No follow-ups on file.    Total face-to-face time with patient: 20 minutes.  Over 50% of encounter was spent on counseling and coordination of care.  Barrington Ellison, MD [facility] Family Medicine Fellow, [facility] for [employer], [place]

## 2019-03-16 LAB — T4 AND TSH
T4, Total: 6.7 ug/dL (ref 4.5–12.0)
TSH: 3.11 u[IU]/mL (ref 0.450–4.500)

## 2019-06-08 ENCOUNTER — Ambulatory Visit (INDEPENDENT_AMBULATORY_CARE_PROVIDER_SITE_OTHER): Payer: Medicare Other

## 2019-06-08 ENCOUNTER — Ambulatory Visit (HOSPITAL_COMMUNITY)
Admission: EM | Admit: 2019-06-08 | Discharge: 2019-06-08 | Disposition: A | Discharge status: Home / Self Care | Payer: Medicare Other | Attending: Family Medicine | Admitting: Family Medicine

## 2019-06-08 ENCOUNTER — Other Ambulatory Visit: Payer: Self-pay

## 2019-06-08 ENCOUNTER — Encounter (HOSPITAL_COMMUNITY): Payer: Self-pay | Admitting: Emergency Medicine

## 2019-06-08 DIAGNOSIS — R109 Unspecified abdominal pain: Secondary | ICD-10-CM

## 2019-06-08 DIAGNOSIS — R112 Nausea with vomiting, unspecified: Secondary | ICD-10-CM

## 2019-06-08 DIAGNOSIS — F319 Bipolar disorder, unspecified: Secondary | ICD-10-CM | POA: Diagnosis not present

## 2019-06-08 DIAGNOSIS — R102 Pelvic and perineal pain: Secondary | ICD-10-CM | POA: Insufficient documentation

## 2019-06-08 DIAGNOSIS — F419 Anxiety disorder, unspecified: Secondary | ICD-10-CM | POA: Diagnosis not present

## 2019-06-08 DIAGNOSIS — Z975 Presence of (intrauterine) contraceptive device: Secondary | ICD-10-CM

## 2019-06-08 DIAGNOSIS — Z20822 Contact with and (suspected) exposure to covid-19: Secondary | ICD-10-CM | POA: Insufficient documentation

## 2019-06-08 DIAGNOSIS — Z3202 Encounter for pregnancy test, result negative: Secondary | ICD-10-CM | POA: Diagnosis not present

## 2019-06-08 DIAGNOSIS — K5900 Constipation, unspecified: Secondary | ICD-10-CM | POA: Diagnosis not present

## 2019-06-08 DIAGNOSIS — E079 Disorder of thyroid, unspecified: Secondary | ICD-10-CM | POA: Diagnosis not present

## 2019-06-08 DIAGNOSIS — Z79899 Other long term (current) drug therapy: Secondary | ICD-10-CM | POA: Diagnosis not present

## 2019-06-08 DIAGNOSIS — Z793 Long term (current) use of hormonal contraceptives: Secondary | ICD-10-CM | POA: Insufficient documentation

## 2019-06-08 LAB — POCT URINALYSIS DIP (DEVICE)
Glucose, UA: NEGATIVE mg/dL
Ketones, ur: 40 mg/dL — AB
Leukocytes,Ua: NEGATIVE
Nitrite: NEGATIVE
Protein, ur: NEGATIVE mg/dL
Specific Gravity, Urine: >=1.03 (ref 1.005–1.030)
Urobilinogen, UA: 0.2 mg/dL (ref 0.0–1.0)
pH: 6 (ref 5.0–8.0)

## 2019-06-08 LAB — POC URINE PREG, ED
Preg Test, Ur: NEGATIVE
Preg Test, Ur: NEGATIVE

## 2019-06-08 LAB — POCT PREGNANCY, URINE: Preg Test, Ur: NEGATIVE

## 2019-06-08 MED ORDER — ONDANSETRON 4 MG PO TBDP: 4.0000 mg | ORAL_TABLET | Freq: Three times a day (TID) | ORAL | 0 refills | Status: DC | PRN

## 2019-06-08 MED ORDER — POLYETHYLENE GLYCOL 3350 17 GM/SCOOP PO POWD: 1.0000 | Freq: Once | ORAL | 0 refills | Status: AC

## 2019-06-08 MED ORDER — ONDANSETRON 4 MG PO TBDP
ORAL_TABLET | ORAL | Status: AC
  Filled 2019-06-08: qty 1

## 2019-06-08 MED ORDER — ONDANSETRON 4 MG PO TBDP
4.0000 mg | ORAL_TABLET | Freq: Once | ORAL | Status: AC
  Administered 2019-06-08: 10:00:00 4 mg via ORAL

## 2019-06-08 NOTE — ED Triage Notes (Signed)
Body aches, cold, shaky, headache and nausea.  Patient vomited 3 times yesterday.  Symptoms started 06/06/2019.

## 2019-06-08 NOTE — Discharge Instructions (Signed)
I believe your symptoms are related to constipation. We will do MiraLAX 1 scoop twice a day until you get a good bowel movement.  Then you can back off to once a day or once every other day Zofran as needed for nausea, vomiting Try to sip fluids because you appear to be mildly dehydrated Follow up as needed for continued or worsening symptoms

## 2019-06-08 NOTE — ED Provider Notes (Signed)
[facility]    CSN: [id number] Arrival date & time: 06/08/19  0901      History   Chief Complaint Chief Complaint  Patient presents with  . Appointment    9:00  . Generalized Body Aches    HPI Veronica Buchanan is a 24 y.o. female.   Patient is a 24 year old female with past medical history of anxiety, bipolar, endometriosis, thyroid disease.  She presents today with generalized body aches, nausea, vomiting, generalized abdominal discomfort x3 days.  Symptoms have been constant, waxing and waning.  She has not had anything for her symptoms.  Last menstrual period was December 25th . (Menstrual status: IUD).  Denies any dysuria, hematuria or urinary frequency.  Currently sexual active with 1 partner.  Denies any vaginal discharge, itching or irritation.  No known sick contacts or recent traveling.  No cough, chest congestion, fever, sore throat or ear pain.  ROS per HPI       Past Medical History:  Diagnosis Date  . Anxiety   . Bipolar disorder ([place])   . 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 Stage I.  . Osteochondroma   . Thyroid disease     Patient Active Problem List   Diagnosis Date Noted  . Chronic female pelvic pain 12/29/2018  . Endometriosis of pelvis 12/29/2018    Past Surgical History:  Procedure Laterality Date  . BIOPSY N/A 12/29/2018   Procedure: Peritoneal biopsies;  Surgeon: Osborne Oman, MD;  Location: [facility];  Service: Gynecology;  Laterality: N/A;  . LAPAROSCOPY N/A 12/29/2018   Procedure: LAPAROSCOPY DIAGNOSTIC;  Surgeon: Osborne Oman, MD;  Location: [facility];  Service: Gynecology;  Laterality: N/A;    OB History    Gravida  0   Para  0   Term  0   Preterm  0   AB  0   Living  0     SAB  0   TAB  0   Ectopic  0   Multiple  0   Live Births  0            Home Medications    Prior to Admission medications    Medication Sig Start Date End Date Taking? Authorizing Provider  albuterol (PROVENTIL HFA;VENTOLIN HFA) 108 (90 BASE) MCG/ACT inhaler Inhale 2 puffs into the lungs every 6 (six) hours as needed for wheezing or shortness of breath.     [provider]  beclomethasone (QVAR) 80 MCG/ACT inhaler Inhale 2 puffs into the lungs 2 (two) times daily.    [provider]  cetirizine (ZYRTEC) 10 MG tablet Take 10 mg by mouth daily.    [provider]  doxepin (SINEQUAN) 25 MG capsule Take 25 mg by mouth.    Joline Salt, RN  fluticasone [facility]) 50 MCG/ACT nasal spray Place 1 spray into both nostrils daily.    [provider]  ibuprofen (ADVIL) 800 MG tablet Take 1 tablet (800 mg total) by mouth every 8 (eight) hours as needed. 03/06/19   Sharion Balloon, NP  levonorgestrel (MIRENA) 20 MCG/24HR IUD 1 each by Intrauterine route once. Inserted 2017 per pt    [provider]  levothyroxine (SYNTHROID, LEVOTHROID) 25 MCG tablet Take 25 mcg by mouth daily before breakfast.    [provider]  lisdexamfetamine (VYVANSE) 50 MG capsule Take 50 mg by mouth daily.    [provider]  ondansetron [facility]  ODT) 4 MG disintegrating tablet Take 1 tablet (4 mg total) by mouth every 8 (eight) hours as needed for nausea or vomiting. 06/08/19   Loura Halt A, NP  sertraline (ZOLOFT) 50 MG tablet Take 50 mg by mouth daily.    Joline Salt, RN  ziprasidone (GEODON) 20 MG capsule Take 20 mg by mouth 2 (two) times daily with a meal.     Joline Salt, RN    Family History Family History  Problem Relation Age of Onset  . Asthma Other   . Heart Problems Mother   . Ovarian cancer Mother 43  . Healthy Father   . Heart Problems Maternal Grandfather     Social History Social History   Tobacco Use  . Smoking status: Never Smoker  . Smokeless tobacco: Never Used  Substance Use Topics  . Alcohol use: Yes    Comment: occasional (2 mos ago)  . Drug use: Yes     Types: Marijuana    Comment: one month ago     Allergies   Adhesive [tape], Shellfish allergy, Latex, and Peanut-containing drug products   Review of Systems Review of Systems   Physical Exam Triage Vital Signs ED Triage Vitals  Enc Vitals Group     BP 06/08/19 0934 116/79     Pulse Rate 06/08/19 0934 85     Resp 06/08/19 0934 18     Temp 06/08/19 0934 98.7 F (37.1 C)     Temp Source 06/08/19 0934 Oral     SpO2 06/08/19 0934 99 %     Weight --      Height --      Head Circumference --      Peak Flow --      Pain Score 06/08/19 0930 7     Pain Loc --      Pain Edu? --      Excl. in [place]? --    No data found.  Updated Vital Signs BP 116/79 (BP Location: Right Arm)   Pulse 85   Temp 98.7 F (37.1 C) (Oral)   Resp 18   SpO2 99%   Visual Acuity Right Eye Distance:   Left Eye Distance:   Bilateral Distance:    Right Eye Near:   Left Eye Near:    Bilateral Near:     Physical Exam Vitals and nursing note reviewed.  Constitutional:      General: She is not in acute distress.    Appearance: Normal appearance. She is not ill-appearing, toxic-appearing or diaphoretic.  HENT:     Head: Normocephalic.     Nose: Nose normal.     Mouth/Throat:     Pharynx: Oropharynx is clear.  Eyes:     Conjunctiva/sclera: Conjunctivae normal.  Pulmonary:     Effort: Pulmonary effort is normal.  Abdominal:     General: Abdomen is flat. Bowel sounds are normal.     Palpations: Abdomen is soft.     Tenderness: There is generalized abdominal tenderness. There is no right CVA tenderness, left CVA tenderness, guarding or rebound.  Musculoskeletal:        General: Normal range of motion.     Cervical back: Normal range of motion.  Skin:    General: Skin is warm and dry.     Findings: No rash.  Neurological:     Mental Status: She is alert.  Psychiatric:        Mood and Affect: Mood normal.  UC Treatments / Results  Labs (all labs ordered are listed, but only  abnormal results are displayed) Labs Reviewed  POCT URINALYSIS DIP (DEVICE) - Abnormal; Notable for the following components:      Result Value   Bilirubin Urine SMALL (*)    Ketones, ur 40 (*)    Hgb urine dipstick TRACE (*)    All other components within normal limits  NOVEL CORONAVIRUS, NAA (HOSP ORDER, SEND-OUT TO REF LAB; TAT 18-24 HRS)  POC URINE PREG, ED  POCT PREGNANCY, URINE  POC URINE PREG, ED    EKG   Radiology DG Abd 1 View  Result Date: 06/08/2019 CLINICAL DATA:  Abdominal pain, nausea and vomiting for 3 days. EXAM: ABDOMEN - 1 VIEW COMPARISON:  None. FINDINGS: The visualized lung bases are clear. The abdominal bowel gas pattern is unremarkable. No findings for obstruction or perforation The soft tissue shadows of the abdomen are maintained. No worrisome calcifications. An IUD is noted in the central pelvis. The bony structures are unremarkable. IMPRESSION: Unremarkable abdominal radiographs. Electronically Signed   By: Marijo Sanes M.D.   On: 06/08/2019 10:50    Procedures Procedures (including critical care time)  Medications Ordered in UC Medications  ondansetron (ZOFRAN-ODT) disintegrating tablet 4 mg (4 mg Oral Given 06/08/19 1028)    Initial Impression / Assessment and Plan / UC Course  I have reviewed the triage vital signs and the nursing notes.  Pertinent labs & imaging results that were available during my care of the patient were reviewed by me and considered in my medical decision making (see chart for details).     Constipation- treating with miralax Zofran for N,V.  Push fluids  Follow up as needed for continued or worsening symptoms  Final Clinical Impressions(s) / UC Diagnoses   Final diagnoses:  Constipation, unspecified constipation type     Discharge Instructions     I believe your symptoms are related to constipation. We will do MiraLAX 1 scoop twice a day until you get a good bowel movement.  Then you can back off to once a day or  once every other day Zofran as needed for nausea, vomiting Try to sip fluids because you appear to be mildly dehydrated Follow up as needed for continued or worsening symptoms     ED Prescriptions    Medication Sig Dispense Auth. Provider   polyethylene glycol powder (GLYCOLAX/MIRALAX) 17 GM/SCOOP powder Take 255 g by mouth once for 1 dose. 255 g Uri Turnbough A, NP   ondansetron (ZOFRAN ODT) 4 MG disintegrating tablet Take 1 tablet (4 mg total) by mouth every 8 (eight) hours as needed for nausea or vomiting. 20 tablet Loura Halt A, NP     PDMP not reviewed this encounter.   Orvan July, NP 06/10/19 [phone]

## 2019-06-10 LAB — NOVEL CORONAVIRUS, NAA (HOSP ORDER, SEND-OUT TO REF LAB; TAT 18-24 HRS): SARS-CoV-2, NAA: NOT DETECTED

## 2019-07-07 ENCOUNTER — Ambulatory Visit: Payer: Medicare Other | Attending: Nurse Practitioner | Admitting: Physical Therapy

## 2019-07-07 ENCOUNTER — Other Ambulatory Visit: Payer: Self-pay

## 2019-07-07 ENCOUNTER — Encounter: Payer: Self-pay | Admitting: Physical Therapy

## 2019-07-07 DIAGNOSIS — M25511 Pain in right shoulder: Secondary | ICD-10-CM | POA: Diagnosis not present

## 2019-07-07 DIAGNOSIS — M25611 Stiffness of right shoulder, not elsewhere classified: Secondary | ICD-10-CM | POA: Insufficient documentation

## 2019-07-07 DIAGNOSIS — M542 Cervicalgia: Secondary | ICD-10-CM

## 2019-07-07 DIAGNOSIS — R252 Cramp and spasm: Secondary | ICD-10-CM | POA: Diagnosis present

## 2019-07-07 NOTE — Therapy (Signed)
[facility] [place], [place], [address] Phone: [phone]   Fax:  [phone]  Physical Therapy Evaluation  Patient Details  Name: Veronica Buchanan MRN: [id number] Date of Birth: [birth year] Referring Provider (PT): Eldridge Abrahams   Encounter Date: 07/07/2019  PT End of Session - 07/07/19 1600    Visit Number  1    Date for PT Re-Evaluation  09/04/19    PT Start Time  1518    PT Stop Time  1605    PT Time Calculation (min)  47 min    Activity Tolerance  Patient tolerated treatment well    Behavior During Therapy  [facility] for tasks assessed/performed       Past Medical History:  Diagnosis Date  . Anxiety   . Bipolar disorder ([place])   . 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 Stage I.  . Osteochondroma   . Thyroid disease     Past Surgical History:  Procedure Laterality Date  . BIOPSY N/A 12/29/2018   Procedure: Peritoneal biopsies;  Surgeon: Osborne Oman, MD;  Location: [facility];  Service: Gynecology;  Laterality: N/A;  . LAPAROSCOPY N/A 12/29/2018   Procedure: LAPAROSCOPY DIAGNOSTIC;  Surgeon: Osborne Oman, MD;  Location: [facility];  Service: Gynecology;  Laterality: N/A;    There were no vitals filed for this visit.   Subjective Assessment - 07/07/19 1520    Subjective  Patient about two weeks ago she fell on the right side, she reports that she has had some pain and spasms since then in the right shoulder and the neck area.  X-rays were negative.  She reports diffiuclty dressing and doing hair    Limitations  House hold activities    Patient Stated Goals  have less pain, more motions, easier with ADL's    Currently in Pain?  Yes    Pain Score  6     Pain Location  Shoulder    Pain Orientation  Right    Pain Descriptors / Indicators  Aching;Spasm    Pain Type  Acute pain    Pain Radiating Towards   some numbness in the right hand    Pain Onset  1 to 4 weeks ago    Pain Frequency  Constant    Aggravating Factors   use of the right arm, lying on the right side , doing laundry, lifting the laundery basket pain up to 9/10    Pain Relieving Factors  ibuprefen pain at [place] can be a 3/10, she reports that ice feels better than heat    Effect of Pain on Daily Activities  difficulty dressing and doing hair, difficulty doing laundry, reaching up         [facility] PT Assessment - 07/07/19 0001      Assessment   Medical Diagnosis  right arm and neck pain    Referring Provider (PT)  Eldridge Abrahams    Onset Date/Surgical Date  06/23/19    Hand Dominance  Right    Prior Therapy  no      Precautions   Precautions  None      Home Environment   Additional Comments  has stairs, does the housework, has a dog      Prior Function   Level of Independence  Independent    Vocation  Part time employment    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  Leisure  no exercise      Posture/Postural Control   Posture Comments  fwd head, rounded shoulders      ROM / Strength   AROM / PROM / Strength  AROM;Strength      AROM   Overall AROM Comments  cervical ROM decreased 50% with guarded motions and pain, right shoulder motions are painful , she c/o popping, I did hear and feel some crepitus, she seemed aprrehensive and fearful with the motions    AROM Assessment Site  Shoulder    Right/Left Shoulder  Right    Right Shoulder Flexion  125 Degrees    Right Shoulder ABduction  110 Degrees    Right Shoulder Internal Rotation  20 Degrees    Right Shoulder External Rotation  60 Degrees      Strength   Overall Strength Comments  4-/5 for shoulder with pain and fear      Palpation   Palpation comment  some scapular winging, she is tender and spasms in the upper trap  and into the neck      Special Tests   Other special tests  a lot of apprehension                Objective measurements  completed on examination: See above findings.      [facility] Adult PT Treatment/Exercise - 07/07/19 0001      Modalities   Modalities  Electrical Stimulation;Cryotherapy      Cryotherapy   Number Minutes Cryotherapy  12 Minutes    Cryotherapy Location  Shoulder    Type of Cryotherapy  Ice pack      Electrical Stimulation   Electrical Stimulation Location  right shoulder    Electrical Stimulation Action  IFC    Electrical Stimulation Parameters  sitting    Electrical Stimulation Goals  Pain             PT Education - 07/07/19 1559    Education Details  yellow tband scapular stabilization    Person(s) Educated  Patient    Methods  Explanation;Demonstration;Handout    Comprehension  Verbalized understanding       PT Short Term Goals - 07/07/19 1604      PT SHORT TERM GOAL #1   Title  independent with initial HEP    Time  2    Period  Weeks    Status  New        PT Long Term Goals - 07/07/19 1604      PT LONG TERM GOAL #1   Title  understand posture and body mechanics    Time  8    Period  Weeks    Status  New      PT LONG TERM GOAL #2   Title  decreaes pain 50%    Time  8    Period  Weeks    Status  New      PT LONG TERM GOAL #3   Title  report no difficulty dressing and doing hair    Time  8    Period  Weeks    Status  New      PT LONG TERM GOAL #4   Title  demonstrat IR of the right shoulder that is >= 70 degrees    Time  8    Period  Weeks    Status  New             Plan - 07/07/19 1600    Clinical  Impression Statement  Patient reports that she had a fall a few weeks ago, she reports that sshe has had pain and decreased ROM, she reports feeling a lot of popping, she reports having spasms in the neck.  X-rays were negative, she was very apprehensive to movements like she was worried about subluxation.  She has limited cervical and shoulder ROM with pain.  Pain iwth MMT.  Popping is palpable, weak in the scapular area    Stability/Clinical  Decision Making  Stable/Uncomplicated    Clinical Decision Making  Low    Rehab Potential  Good    PT Frequency  2x / week    PT Duration  8 weeks    PT Treatment/Interventions  ADLs/Self Care Home Management;Cryotherapy;Ultrasound;Moist Heat;Electrical Stimulation;Iontophoresis 4mg /ml Dexamethasone;Therapeutic activities;Therapeutic exercise;Patient/family education;Manual techniques;Dry needling    PT Next Visit Plan  scapular stabilization    Consulted and Agree with Plan of Care  Patient       Patient will benefit from skilled therapeutic intervention in order to improve the following deficits and impairments:  Pain, Improper body mechanics, Postural dysfunction, Increased muscle spasms, Decreased range of motion, Decreased strength, Impaired UE functional use  Visit Diagnosis: Acute pain of right shoulder - Plan: PT plan of care cert/re-cert  Cervicalgia - Plan: PT plan of care cert/re-cert  Stiffness of right shoulder, not elsewhere classified - Plan: PT plan of care cert/re-cert  Cramp and spasm - Plan: PT plan of care cert/re-cert     Problem List Patient Active Problem List   Diagnosis Date Noted  . Chronic female pelvic pain 12/29/2018  . Endometriosis of pelvis 12/29/2018    Sumner Boast., PT 07/07/2019, 4:08 PM  [facility] [place], [place], [address] Phone: [phone]   Fax:  [phone]  Name: Veronica Buchanan MRN: [id number] Date of Birth: [birth year]

## 2019-07-15 ENCOUNTER — Ambulatory Visit: Payer: Medicare Other | Admitting: Physical Therapy

## 2019-07-15 ENCOUNTER — Encounter: Payer: Self-pay | Admitting: Physical Therapy

## 2019-07-15 ENCOUNTER — Other Ambulatory Visit: Payer: Self-pay

## 2019-07-15 DIAGNOSIS — M542 Cervicalgia: Secondary | ICD-10-CM

## 2019-07-15 DIAGNOSIS — R252 Cramp and spasm: Secondary | ICD-10-CM

## 2019-07-15 DIAGNOSIS — M25511 Pain in right shoulder: Secondary | ICD-10-CM | POA: Diagnosis not present

## 2019-07-15 DIAGNOSIS — M25611 Stiffness of right shoulder, not elsewhere classified: Secondary | ICD-10-CM

## 2019-07-15 NOTE — Therapy (Signed)
Veronica Buchanan, [place], [address] Phone: [phone]   Fax:  [phone]  Physical Therapy Treatment  Patient Details  Name: Veronica Buchanan MRN: [id number] Date of Birth: 11-Jul-1995 Referring Provider (PT): Eldridge Abrahams   Encounter Date: 07/15/2019  PT End of Session - 07/15/19 0934    Visit Number  2    Date for PT Re-Evaluation  09/04/19    PT Start Time  0934    PT Stop Time  1028    PT Time Calculation (min)  54 min    Activity Tolerance  Patient tolerated treatment well    Behavior During Therapy  [facility] for tasks assessed/performed       Past Medical History:  Diagnosis Date  . Anxiety   . Bipolar disorder ([place])   . 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 Stage I.  . Osteochondroma   . Thyroid disease     Past Surgical History:  Procedure Laterality Date  . BIOPSY N/A 12/29/2018   Procedure: Peritoneal biopsies;  Surgeon: Osborne Oman, MD;  Location: [place];  Service: Gynecology;  Laterality: N/A;  . LAPAROSCOPY N/A 12/29/2018   Procedure: LAPAROSCOPY DIAGNOSTIC;  Surgeon: Osborne Oman, MD;  Location: [place];  Service: Gynecology;  Laterality: N/A;    There were no vitals filed for this visit.  Subjective Assessment - 07/15/19 0934    Subjective  Rt shoulder keeps popping and hurting. Has been doing her band exercise - has pain with these however is able to do them    Patient Stated Goals  have less pain, more motions, easier with ADL's    Currently in Pain?  Yes    Pain Score  6     Pain Location  Shoulder    Pain Orientation  Right;Anterior    Pain Descriptors / Indicators  Sore                       [facility] Adult PT Treatment/Exercise - 07/15/19 0001      Exercises   Exercises  Neck      Neck Exercises: Machines for Strengthening   UBE (Upper Arm Bike)   L1x4' alt FWD/BWD      Neck Exercises: Seated   Other Seated Exercise  BWD shoulder rolls and shoulder ER elbows at 90      Neck Exercises: Supine   Other Supine Exercise  shoulder presses with thoracic lift      Modalities   Modalities  Electrical Stimulation;Moist Heat      Moist Heat Therapy   Number Minutes Moist Heat  15 Minutes    Moist Heat Location  Cervical      Electrical Stimulation   Electrical Stimulation Location  cervical and Rt ant shoulder    Electrical Stimulation Action  IFC    Electrical Stimulation Parameters  to tolerance supine    Electrical Stimulation Goals  Pain      Manual Therapy   Manual Therapy  Soft tissue mobilization;Joint mobilization;Manual Traction    Joint Mobilization  grade II cervical in supine    Soft tissue mobilization  STM to bilat cervical paraspinals, SCM, upper trap, pecs and deltoid    Manual Traction  cervical       Neck Exercises: Stretches   Chest Stretch  2 reps;20 seconds   in doorway mid and low  PT Short Term Goals - 07/07/19 1604      PT SHORT TERM GOAL #1   Title  independent with initial HEP    Time  2    Period  Weeks    Status  New        PT Long Term Goals - 07/07/19 1604      PT LONG TERM GOAL #1   Title  understand posture and body mechanics    Time  8    Period  Weeks    Status  New      PT LONG TERM GOAL #2   Title  decreaes pain 50%    Time  8    Period  Weeks    Status  New      PT LONG TERM GOAL #3   Title  report no difficulty dressing and doing hair    Time  8    Period  Weeks    Status  New      PT LONG TERM GOAL #4   Title  demonstrat IR of the right shoulder that is >= 70 degrees    Time  8    Period  Weeks    Status  New            Plan - 07/15/19 1108    Clinical Impression Statement  Pt presents with continued soreness in the Rt anterior shoulder and neck.  Having some tingling into her fingers.  She is very tight in her Rt SCM, pecs and has a  large trigger point in the Rt levator near the insertion site.There is palpable edema around the Rt cervical paraspinals. Heat was tried today as she states the ice didn't help as much the last couple of days.  She reported feeling more loose at end of session.    Rehab Potential  Good    PT Frequency  2x / week    PT Duration  8 weeks    PT Treatment/Interventions  ADLs/Self Care Home Management;Cryotherapy;Ultrasound;Moist Heat;Electrical Stimulation;Iontophoresis 4mg /ml Dexamethasone;Therapeutic activities;Therapeutic exercise;Patient/family education;Manual techniques;Dry needling    PT Next Visit Plan  scapular stabilization manual work PRN and modalities    Consulted and Agree with Plan of Care  Patient       Patient will benefit from skilled therapeutic intervention in order to improve the following deficits and impairments:  Pain, Improper body mechanics, Postural dysfunction, Increased muscle spasms, Decreased range of motion, Decreased strength, Impaired UE functional use  Visit Diagnosis: Acute pain of right shoulder  Cervicalgia  Stiffness of right shoulder, not elsewhere classified  Cramp and spasm     Problem List Patient Active Problem List   Diagnosis Date Noted  . Chronic female pelvic pain 12/29/2018  . Endometriosis of pelvis 12/29/2018    Jeral Pinch PT  07/15/2019, 11:46 AM  [place], [place], [address] Phone: [phone]   Fax:  [phone]  Name: Veronica Buchanan MRN: [id number] Date of Birth: Sep 17, 1995

## 2019-07-25 ENCOUNTER — Other Ambulatory Visit: Payer: Self-pay

## 2019-07-25 ENCOUNTER — Encounter: Payer: Self-pay | Admitting: Physical Therapy

## 2019-07-25 ENCOUNTER — Ambulatory Visit: Payer: Medicare Other | Attending: Nurse Practitioner | Admitting: Physical Therapy

## 2019-07-25 DIAGNOSIS — M542 Cervicalgia: Secondary | ICD-10-CM

## 2019-07-25 DIAGNOSIS — R252 Cramp and spasm: Secondary | ICD-10-CM | POA: Diagnosis present

## 2019-07-25 DIAGNOSIS — M25511 Pain in right shoulder: Secondary | ICD-10-CM

## 2019-07-25 DIAGNOSIS — M25611 Stiffness of right shoulder, not elsewhere classified: Secondary | ICD-10-CM | POA: Diagnosis present

## 2019-07-25 NOTE — Therapy (Signed)
[place], [place], [address] Phone: [phone]   Fax:  [phone]  Physical Therapy Treatment  Patient Details  Name: Veronica Buchanan MRN: [id number] Date of Birth: 14-Oct-1995 Referring Provider (PT): Eldridge Abrahams   Encounter Date: 07/25/2019  PT End of Session - 07/25/19 1150    Visit Number  3    Date for PT Re-Evaluation  09/04/19    PT Start Time  1150    PT Stop Time  1243    PT Time Calculation (min)  53 min    Activity Tolerance  Patient tolerated treatment well    Behavior During Therapy  [facility] for tasks assessed/performed       Past Medical History:  Diagnosis Date  . Anxiety   . Bipolar disorder ([place])   . 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 Stage I.  . Osteochondroma   . Thyroid disease     Past Surgical History:  Procedure Laterality Date  . BIOPSY N/A 12/29/2018   Procedure: Peritoneal biopsies;  Surgeon: Osborne Oman, MD;  Location: [place];  Service: Gynecology;  Laterality: N/A;  . LAPAROSCOPY N/A 12/29/2018   Procedure: LAPAROSCOPY DIAGNOSTIC;  Surgeon: Osborne Oman, MD;  Location: [place];  Service: Gynecology;  Laterality: N/A;    There were no vitals filed for this visit.  Subjective Assessment - 07/25/19 1150    Subjective  Pt reports her shoulder isn't hurting as bad, still has the popping.    Currently in Pain?  No/denies                       [facility] Adult PT Treatment/Exercise - 07/25/19 0001      Exercises   Exercises  Neck      Neck Exercises: Machines for Strengthening   UBE (Upper Arm Bike)  L2x6' alt FWD/BWD    Cybex Row  2x10 15#    Lat Pull  2x10, 20#, VC to keep shoulders down - tends to have elevation in Rt shoulder complex       Neck Exercises: Supine   Neck Retraction  20 reps;3 secs      Neck Exercises: Sidelying   Other  Sidelying Exercise  Rt shoulder ER 3x10, towel under arm, 3#, empty can in small ROM with 2#      Neck Exercises: Prone   Other Prone Exercise  10 reps thoracic rotation in quadraped, then thread the needle      Modalities   Modalities  Electrical Stimulation;Moist Heat      Moist Heat Therapy   Number Minutes Moist Heat  15 Minutes    Moist Heat Location  Cervical      Electrical Stimulation   Electrical Stimulation Location  cervical and Rt ant shoulder    Electrical Stimulation Action  IFC    Electrical Stimulation Parameters  to tolerance supine    Electrical Stimulation Goals  Pain;Tone               PT Short Term Goals - 07/07/19 1604      PT SHORT TERM GOAL #1   Title  independent with initial HEP    Time  2    Period  Weeks    Status  New        PT Long Term Goals - 07/07/19 1604      PT LONG  TERM GOAL #1   Title  understand posture and body mechanics    Time  8    Period  Weeks    Status  New      PT LONG TERM GOAL #2   Title  decreaes pain 50%    Time  8    Period  Weeks    Status  New      PT LONG TERM GOAL #3   Title  report no difficulty dressing and doing hair    Time  8    Period  Weeks    Status  New      PT LONG TERM GOAL #4   Title  demonstrat IR of the right shoulder that is >= 70 degrees    Time  8    Period  Weeks    Status  New            Plan - 07/25/19 1229    Clinical Impression Statement  Veronica Buchanan reports her Rt shoulder pain is decreasing, still has the popping sensation.  The trigger point in her Rt levetor scap is less when palpated, still tender.  She has been progressed to red band as yellow is to easy now.  Only her third visit, making good progress.    Stability/Clinical Decision Making  Stable/Uncomplicated    Rehab Potential  Good    PT Frequency  2x / week    PT Duration  8 weeks    PT Treatment/Interventions  ADLs/Self Care Home Management;Cryotherapy;Ultrasound;Moist Heat;Electrical  Stimulation;Iontophoresis 4mg /ml Dexamethasone;Therapeutic activities;Therapeutic exercise;Patient/family education;Manual techniques;Dry needling    PT Next Visit Plan  scapular stabilization manual work PRN and modalities    Consulted and Agree with Plan of Care  Patient       Patient will benefit from skilled therapeutic intervention in order to improve the following deficits and impairments:  Pain, Improper body mechanics, Postural dysfunction, Increased muscle spasms, Decreased range of motion, Decreased strength, Impaired UE functional use  Visit Diagnosis: Acute pain of right shoulder  Cervicalgia  Stiffness of right shoulder, not elsewhere classified  Cramp and spasm     Problem List Patient Active Problem List   Diagnosis Date Noted  . Chronic female pelvic pain 12/29/2018  . Endometriosis of pelvis 12/29/2018    Boneta Lucks rPT  07/25/2019, 12:31 PM  [place], [place], [address] Phone: [phone]   Fax:  [phone]  Name: Veronica Buchanan MRN: [id number] Date of Birth: [birth year]

## 2019-07-28 ENCOUNTER — Ambulatory Visit: Payer: Medicare Other | Admitting: Physical Therapy

## 2019-07-28 ENCOUNTER — Other Ambulatory Visit: Payer: Self-pay

## 2019-07-28 ENCOUNTER — Encounter: Payer: Self-pay | Admitting: Physical Therapy

## 2019-07-28 DIAGNOSIS — M25511 Pain in right shoulder: Secondary | ICD-10-CM | POA: Diagnosis not present

## 2019-07-28 DIAGNOSIS — M25611 Stiffness of right shoulder, not elsewhere classified: Secondary | ICD-10-CM

## 2019-07-28 DIAGNOSIS — M542 Cervicalgia: Secondary | ICD-10-CM

## 2019-07-28 DIAGNOSIS — R252 Cramp and spasm: Secondary | ICD-10-CM

## 2019-07-28 NOTE — Therapy (Signed)
[place], [place], [address] Phone: [phone]   Fax:  [phone]  Physical Therapy Treatment  Patient Details  Name: Veronica Buchanan MRN: [id number] Date of Birth: Oct 08, 1995 Referring Provider (PT): Eldridge Abrahams   Encounter Date: 07/28/2019  PT End of Session - 07/28/19 1238    Visit Number  4    Date for PT Re-Evaluation  09/04/19    PT Start Time  1234    PT Stop Time  1332    PT Time Calculation (min)  58 min    Activity Tolerance  Patient tolerated treatment well       Past Medical History:  Diagnosis Date  . Anxiety   . Bipolar disorder ([place])   . 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 Stage I.  . Osteochondroma   . Thyroid disease     Past Surgical History:  Procedure Laterality Date  . BIOPSY N/A 12/29/2018   Procedure: Peritoneal biopsies;  Surgeon: Osborne Oman, MD;  Location: [place];  Service: Gynecology;  Laterality: N/A;  . LAPAROSCOPY N/A 12/29/2018   Procedure: LAPAROSCOPY DIAGNOSTIC;  Surgeon: Osborne Oman, MD;  Location: [place];  Service: Gynecology;  Laterality: N/A;    There were no vitals filed for this visit.  Subjective Assessment - 07/28/19 1235    Subjective  Pt reports that she saw MD yesterday and had xrays - MD said come back in two weeks for reassessment -xrays show an [facility] joint separation, she is becoming anxious about this.    Patient Stated Goals  have less pain, more motions, easier with ADL's    Currently in Pain?  Yes    Pain Score  8     Pain Location  Shoulder    Pain Orientation  Right;Anterior    Pain Descriptors / Indicators  Sharp    Pain Type  Acute pain                       [facility] Adult PT Treatment/Exercise - 07/28/19 0001      Neck Exercises: Machines for Strengthening   UBE (Upper Arm Bike)  L2x6' alt FWD/BWD      Neck Exercises: Standing   Other Standing Exercises  5x10sec scap retraction around pool noodle.       Neck Exercises: Seated   Other Seated Exercise  CW/CCW circles of Rt UE with arm pressed into blue physioball       Neck Exercises: Supine   Other Supine Exercise  3x10 scapular retraction/protraction with 1# wts, VC for form      Modalities   Modalities  Electrical Stimulation;Moist Heat      Moist Heat Therapy   Number Minutes Moist Heat  15 Minutes    Moist Heat Location  Cervical      Electrical Stimulation   Electrical Stimulation Location  cervical and Rt ant shoulder    Electrical Stimulation Action  IFC in supine    Electrical Stimulation Parameters  to tolerance    Electrical Stimulation Goals  Pain;Tone      Manual Therapy   Manual Therapy  Taping    Kinesiotex  Ligament Correction      Kinesiotix   Ligament Correction  Rt shoulder [place] joint support proximal - star.                PT Short  Term Goals - 07/07/19 1604      PT SHORT TERM GOAL #1   Title  independent with initial HEP    Time  2    Period  Weeks    Status  New        PT Long Term Goals - 07/07/19 1604      PT LONG TERM GOAL #1   Title  understand posture and body mechanics    Time  8    Period  Weeks    Status  New      PT LONG TERM GOAL #2   Title  decreaes pain 50%    Time  8    Period  Weeks    Status  New      PT LONG TERM GOAL #3   Title  report no difficulty dressing and doing hair    Time  8    Period  Weeks    Status  New      PT LONG TERM GOAL #4   Title  demonstrat IR of the right shoulder that is >= 70 degrees    Time  8    Period  Weeks    Status  New            Plan - 07/28/19 1320    Clinical Impression Statement  Pt saw orthopedic MD and had xrays taken.  She has a Rt shoulder AC joint separation.  MD wants her to do a couple more weeks of PT and then return to him to see how things are going.  If not better he may recommend surgery.  K-tape was  applied to the joint for support/ligament correction and her HEP was modified to focus on stabilization around the joint and not stressing it.    Rehab Potential  Good    PT Frequency  2x / week    PT Duration  8 weeks    PT Treatment/Interventions  ADLs/Self Care Home Management;Cryotherapy;Ultrasound;Moist Heat;Electrical Stimulation;Iontophoresis 4mg /ml Dexamethasone;Therapeutic activities;Therapeutic exercise;Patient/family education;Manual techniques;Dry needling    PT Next Visit Plan  assess response to tape, - AC joint stabilization ex    Consulted and Agree with Plan of Care  Patient       Patient will benefit from skilled therapeutic intervention in order to improve the following deficits and impairments:  Pain, Improper body mechanics, Postural dysfunction, Increased muscle spasms, Decreased range of motion, Decreased strength, Impaired UE functional use  Visit Diagnosis: Acute pain of right shoulder  Cervicalgia  Stiffness of right shoulder, not elsewhere classified  Cramp and spasm     Problem List Patient Active Problem List   Diagnosis Date Noted  . Chronic female pelvic pain 12/29/2018  . Endometriosis of pelvis 12/29/2018    Jeral Pinch PT  07/28/2019, 1:23 PM  [place], [place], [address] Phone: [phone]   Fax:  [phone]  Name: Aylah Laurain MRN: [id number] Date of Birth: Apr 15, 1996

## 2019-07-28 NOTE — Patient Instructions (Signed)
Access Code: [email or web address]  URL: [email or web address]  Date: 07/28/2019  Prepared by: Jeral Pinch   Exercises Shoulder Flexion Wall Walk - 10 reps - 1x daily Horizontal Wall Walk with Resistance - 10 reps - 1x daily Supine Scapular Protraction in Flexion with Dumbbells - 10 reps - 3 sets - 1x daily Patient Education [facility] Joint Separation

## 2019-08-02 ENCOUNTER — Ambulatory Visit: Payer: Medicare Other | Admitting: Physical Therapy

## 2019-08-02 ENCOUNTER — Other Ambulatory Visit: Payer: Self-pay

## 2019-08-02 DIAGNOSIS — M25611 Stiffness of right shoulder, not elsewhere classified: Secondary | ICD-10-CM

## 2019-08-02 DIAGNOSIS — R252 Cramp and spasm: Secondary | ICD-10-CM

## 2019-08-02 DIAGNOSIS — M542 Cervicalgia: Secondary | ICD-10-CM

## 2019-08-02 DIAGNOSIS — M25511 Pain in right shoulder: Secondary | ICD-10-CM

## 2019-08-02 NOTE — Therapy (Signed)
[place], [place], [address] Phone: [phone]   Fax:  [phone]  Physical Therapy Treatment  Patient Details  Name: Veronica Buchanan MRN: [id number] Date of Birth: [birth year] Referring Provider (PT): Eldridge Abrahams   Encounter Date: 08/02/2019  PT End of Session - 08/02/19 1255    Visit Number  5    Date for PT Re-Evaluation  09/04/19    PT Start Time  [id number]    PT Stop Time  1323    PT Time Calculation (min)  48 min    Activity Tolerance  Patient limited by pain       Past Medical History:  Diagnosis Date  . Anxiety   . Bipolar disorder ([place])   . 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 Stage I.  . Osteochondroma   . Thyroid disease     Past Surgical History:  Procedure Laterality Date  . BIOPSY N/A 12/29/2018   Procedure: Peritoneal biopsies;  Surgeon: Osborne Oman, MD;  Location: [place];  Service: Gynecology;  Laterality: N/A;  . LAPAROSCOPY N/A 12/29/2018   Procedure: LAPAROSCOPY DIAGNOSTIC;  Surgeon: Osborne Oman, MD;  Location: [place];  Service: Gynecology;  Laterality: N/A;    There were no vitals filed for this visit.  Subjective Assessment - 08/02/19 1239    Subjective  Had to remove the tape d/t itching and skin irritaion.  Had a bad muscle spasm in the Rt neck and shoulder - had numbness and tingling into her Rt hand.  Used her muscle relaxer to help.  Also used some heat.    Patient Stated Goals  have less pain, more motions, easier with ADL's    Currently in Pain?  Yes    Pain Score  7     Pain Location  Shoulder                       [facility] Adult PT Treatment/Exercise - 08/02/19 0001      Neck Exercises: Supine   Neck Retraction  20 reps;3 secs  (Pended)     Neck Retraction Limitations  with chin tuck  (Pended)     Other Supine Exercise  10 reps, 3 sec  holds, shouder presses, thoracic lifts.  (Pended)       Manual Therapy   Manual Therapy  Joint mobilization;Soft tissue mobilization;Manual Traction  (Pended)     Manual therapy comments  neck passive stretching in all directions.   (Pended)     Joint Mobilization  grade II cervical mobs in supine  (Pended)     Soft tissue mobilization  STM to Rt upper trap,, upper trap and SCM - very tight  (Pended)     Manual Traction  gentle cervical traction  (Pended)                PT Short Term Goals - 07/07/19 1604      PT SHORT TERM GOAL #1   Title  independent with initial HEP    Time  2    Period  Weeks    Status  New        PT Long Term Goals - 07/07/19 1604      PT LONG TERM GOAL #1   Title  understand posture and body mechanics    Time  8    Period  Weeks  Status  New      PT LONG TERM GOAL #2   Title  decreaes pain 50%    Time  8    Period  Weeks    Status  New      PT LONG TERM GOAL #3   Title  report no difficulty dressing and doing hair    Time  8    Period  Weeks    Status  New      PT LONG TERM GOAL #4   Title  demonstrat IR of the right shoulder that is >= 70 degrees    Time  8    Period  Weeks    Status  New            Plan - 08/02/19 1318    Clinical Impression Statement  Veronica Buchanan presented today with a lot more pain in the Rt neck and shoulder.  She stated she had a major muscle spasm on saturday and it caused pain and numbness into her Rt hand.  Muscle relaxers helped. She also had to remove the tape d/t skin irritation.  She did think the tape was helping.  She is very tight and has a lot of guarding today in her Rt SCM, proximal deltoid. Pt reported increased spasm with sitting up after heat and stim along with increased numbness in her hand.  This decreased with gentle manual work.  She was advised to take muscle relaxer when she got home and if symptoms returned to call MD.    Rehab Potential  Good    PT Frequency  2x / week    PT Duration   8 weeks    PT Treatment/Interventions  ADLs/Self Care Home Management;Cryotherapy;Ultrasound;Moist Heat;Electrical Stimulation;Iontophoresis 4mg /ml Dexamethasone;Therapeutic activities;Therapeutic exercise;Patient/family education;Manual techniques;Dry needling    PT Next Visit Plan  manual work if still tightn, [facility] joint stabilization       Patient will benefit from skilled therapeutic intervention in order to improve the following deficits and impairments:  Pain, Improper body mechanics, Postural dysfunction, Increased muscle spasms, Decreased range of motion, Decreased strength, Impaired UE functional use  Visit Diagnosis: Acute pain of right shoulder  Cervicalgia  Stiffness of right shoulder, not elsewhere classified  Cramp and spasm     Problem List Patient Active Problem List   Diagnosis Date Noted  . Chronic female pelvic pain 12/29/2018  . Endometriosis of pelvis 12/29/2018    Jeral Pinch PT  08/02/2019, 1:38 PM  [place], [place], [address] Phone: [phone]   Fax:  [phone]  Name: Veronica Buchanan MRN: [id number] Date of Birth: 08-15-1995

## 2019-08-04 ENCOUNTER — Other Ambulatory Visit: Payer: Self-pay

## 2019-08-04 ENCOUNTER — Encounter (HOSPITAL_COMMUNITY): Payer: Self-pay | Admitting: Emergency Medicine

## 2019-08-04 ENCOUNTER — Telehealth: Payer: Self-pay | Admitting: Physical Therapy

## 2019-08-04 ENCOUNTER — Emergency Department (HOSPITAL_COMMUNITY)
Admission: EM | Admit: 2019-08-04 | Discharge: 2019-08-04 | Disposition: A | Discharge status: Home / Self Care | Payer: Medicare Other | Attending: Emergency Medicine | Admitting: Emergency Medicine

## 2019-08-04 ENCOUNTER — Ambulatory Visit: Payer: Medicare Other | Admitting: Physical Therapy

## 2019-08-04 DIAGNOSIS — E079 Disorder of thyroid, unspecified: Secondary | ICD-10-CM | POA: Diagnosis not present

## 2019-08-04 DIAGNOSIS — Z91040 Latex allergy status: Secondary | ICD-10-CM | POA: Diagnosis not present

## 2019-08-04 DIAGNOSIS — M25511 Pain in right shoulder: Secondary | ICD-10-CM | POA: Diagnosis present

## 2019-08-04 DIAGNOSIS — F319 Bipolar disorder, unspecified: Secondary | ICD-10-CM | POA: Insufficient documentation

## 2019-08-04 DIAGNOSIS — Z79899 Other long term (current) drug therapy: Secondary | ICD-10-CM | POA: Insufficient documentation

## 2019-08-04 DIAGNOSIS — M542 Cervicalgia: Secondary | ICD-10-CM | POA: Diagnosis not present

## 2019-08-04 DIAGNOSIS — M792 Neuralgia and neuritis, unspecified: Secondary | ICD-10-CM | POA: Insufficient documentation

## 2019-08-04 MED ORDER — GABAPENTIN 100 MG PO CAPS: 100.0000 mg | ORAL_CAPSULE | Freq: Three times a day (TID) | ORAL | 0 refills | Status: DC

## 2019-08-04 MED ORDER — PREDNISONE 20 MG PO TABS: ORAL_TABLET | ORAL | 0 refills | Status: DC

## 2019-08-04 NOTE — Discharge Instructions (Signed)
Your pain is likely due to a pinch nerve.  Take medications prescribed.  Wear sling for support and follow up closely with your orthopedist next week for further evaluation.  You may benefit from an MRI if your orthopedist deemed appropriate

## 2019-08-04 NOTE — ED Notes (Signed)
Patient verbalizes understanding of discharge instructions. Opportunity for questioning and answers were provided. Armband removed by staff, pt discharged from ED.  

## 2019-08-04 NOTE — Telephone Encounter (Signed)
Pt presented for tx today, she was having a lot of Rt sided neck and chest pain along with whole arm numbness and some numbness in the Rt thigh.  Her muscle relaxer and pain meds are not helping anymore.  Recommend she see an doctor to have her neck assess and r/o anything going on there that may be causing this significant change in symptoms.  Treatment held today, she will call with results.  Jeral Pinch, PT 08/04/19 12:39 PM

## 2019-08-04 NOTE — ED Triage Notes (Signed)
Pt c/o right shoulder pain following a fall x 1 month ago. States her initial xray was normal, had another last week and was told there may be a fracture. Pt states she was not given any form of splint or sling. Also states her pain medication is not working.

## 2019-08-04 NOTE — ED Provider Notes (Signed)
[facility] EMERGENCY DEPARTMENT Provider Note   CSN: [id number] Arrival date & time: 08/04/19  1606     History Chief Complaint  Patient presents with  . Shoulder Pain    Veronica Buchanan is a 24 y.o. female.  The history is provided by the patient and medical records. No language interpreter was used.  Shoulder Pain Associated symptoms: no fever      24 year old female with history of osteochondroma, anxiety, bipolar, presenting for evaluation of right shoulder pain.  Patient report a month ago she was walking down the steps at her sister's house when she lost balance, fell and struck her right shoulder.  Since then she has had pain about her right shoulder, neck with occasional shooting pain and numbness down her right arm.  She was initially evaluated by her PCP who set her up for physical therapy.  She had physical therapy for about 2 weeks without any improvement therefore her PCP schedule her to be seen by an orthopedist.  She saw the orthopedist, had an x-ray of her right shoulder but was unsure if she has any significant signs of injury.  She was recommended to continue physical therapy for 2 weeks and follow-up with orthopedist.  Her last physical therapy was today however patient endorsed continue neck and right shoulder pain with tingling sensation down her arm and thus her physical therapist recommend patient to be evaluation for further specialist.  She is here requesting for MRI of her neck.  She denies dropping objects.  She is right-hand dominant.  She has been using muscle relaxant and anti-inflammatory medication without adequate relief.  No fever chills or rash.  Past Medical History:  Diagnosis Date  . Anxiety   . Bipolar disorder ([place])   . 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 Stage I.  . Osteochondroma   . Thyroid disease     Patient Active Problem List   Diagnosis Date Noted  .  Chronic female pelvic pain 12/29/2018  . Endometriosis of pelvis 12/29/2018    Past Surgical History:  Procedure Laterality Date  . BIOPSY N/A 12/29/2018   Procedure: Peritoneal biopsies;  Surgeon: Osborne Oman, MD;  Location: [facility];  Service: Gynecology;  Laterality: N/A;  . LAPAROSCOPY N/A 12/29/2018   Procedure: LAPAROSCOPY DIAGNOSTIC;  Surgeon: Osborne Oman, MD;  Location: [facility];  Service: Gynecology;  Laterality: N/A;     OB History    Gravida  0   Para  0   Term  0   Preterm  0   AB  0   Living  0     SAB  0   TAB  0   Ectopic  0   Multiple  0   Live Births  0           Family History  Problem Relation Age of Onset  . Asthma Other   . Heart Problems Mother   . Ovarian cancer Mother 61  . Healthy Father   . Heart Problems Maternal Grandfather     Social History   Tobacco Use  . Smoking status: Never Smoker  . Smokeless tobacco: Never Used  Substance Use Topics  . Alcohol use: Yes    Comment: occasional (2 mos ago)  . Drug use: Yes    Types: Marijuana    Comment: one month ago    Home Medications Prior to Admission medications   Medication  Sig Start Date End Date Taking? Authorizing Provider  albuterol (PROVENTIL HFA;VENTOLIN HFA) 108 (90 BASE) MCG/ACT inhaler Inhale 2 puffs into the lungs every 6 (six) hours as needed for wheezing or shortness of breath.     [provider]  beclomethasone (QVAR) 80 MCG/ACT inhaler Inhale 2 puffs into the lungs 2 (two) times daily.    [provider]  cetirizine (ZYRTEC) 10 MG tablet Take 10 mg by mouth daily.    [provider]  doxepin (SINEQUAN) 25 MG capsule Take 25 mg by mouth.    Joline Salt, RN  fluticasone [facility]) 50 MCG/ACT nasal spray Place 1 spray into both nostrils daily.    [provider]  ibuprofen (ADVIL) 800 MG tablet Take 1 tablet (800 mg total) by mouth every 8 (eight) hours as needed. 03/06/19   Sharion Balloon, NP  levonorgestrel (MIRENA) 20 MCG/24HR IUD 1 each by Intrauterine route once. Inserted 2017 per pt    [provider]  levothyroxine (SYNTHROID, LEVOTHROID) 25 MCG tablet Take 25 mcg by mouth daily before breakfast.    [provider]  lisdexamfetamine (VYVANSE) 50 MG capsule Take 50 mg by mouth daily.    [provider]  ondansetron (ZOFRAN ODT) 4 MG disintegrating tablet Take 1 tablet (4 mg total) by mouth every 8 (eight) hours as needed for nausea or vomiting. 06/08/19   Loura Halt A, NP  sertraline (ZOLOFT) 50 MG tablet Take 50 mg by mouth daily.    Joline Salt, RN  ziprasidone (GEODON) 20 MG capsule Take 20 mg by mouth 2 (two) times daily with a meal.     Joline Salt, RN    Allergies    Adhesive [tape], Shellfish allergy, Latex, and Peanut-containing drug products  Review of Systems   Review of Systems  Constitutional: Negative for fever.  Musculoskeletal: Positive for arthralgias.  Skin: Negative for wound.  Neurological: Positive for numbness.    Physical Exam Updated Vital Signs BP 109/61 (BP Location: Right Arm)   Pulse (!) 110   Temp 98.5 F (36.9 C) (Oral)   Resp 18   SpO2 100%   Physical Exam Vitals and nursing note reviewed.  Constitutional:      General: She is not in acute distress.    Appearance: She is well-developed.  HENT:     Head: Atraumatic.  Eyes:     Conjunctiva/sclera: Conjunctivae normal.  Musculoskeletal:        General: Tenderness (No significant midline spine tenderness.  Tenderness along right trapezius, right lateral deltoid, along the right clavicle, and at the right biceps brachialis on palpation without any obvious deformity.  Decreased shoulder raise secondary to pain.  ) present.     Cervical back: Neck supple.  Skin:    Findings: No rash.  Neurological:     Mental Status: She is alert.     Comments: Sensation is slightly diminished on the right arm compared to left with intact radial pulse  and normal grip strength bilaterally.     ED Results / Procedures / Treatments   Labs (all labs ordered are listed, but only abnormal results are displayed) Labs Reviewed - No data to display  EKG None  Radiology No results found.  Procedures Procedures (including critical care time)  Medications Ordered in ED Medications - No data to display  ED Course  I have reviewed the triage vital signs and the nursing notes.  Pertinent labs & imaging results that were available during  my care of the patient were reviewed by me and considered in my medical decision making (see chart for details).    MDM Rules/Calculators/A&P                      BP 109/61 (BP Location: Right Arm)   Pulse (!) 110   Temp 98.5 F (36.9 C) (Oral)   Resp 18   SpO2 100%   Final Clinical Impression(s) / ED Diagnoses Final diagnoses:  Radicular pain in right arm    Rx / DC Orders ED Discharge Orders         Ordered    predniSONE (DELTASONE) 20 MG tablet     08/04/19 1736    gabapentin (NEURONTIN) 100 MG capsule  3 times daily     08/04/19 1736         5:29 PM Patient here with radicular right arm pain after a fall a month ago not adequately managed with physical therapy.  She does have an appointment with with orthopedist in the next few days.  She may benefit from an outpatient MRI but at this time I believe patient would benefit from a course of prednisone and gabapentin for her nerve pain.  I offered a repeat x-ray but patient declined.  Return precaution discussed.   Domenic Moras, PA-C 08/04/19 1743    Lucrezia Starch, MD 08/09/19 [phone]

## 2019-08-15 ENCOUNTER — Emergency Department (HOSPITAL_COMMUNITY)
Admission: EM | Admit: 2019-08-15 | Discharge: 2019-08-15 | Disposition: A | Discharge status: Home / Self Care | Payer: Medicare Other | Attending: Emergency Medicine | Admitting: Emergency Medicine

## 2019-08-15 ENCOUNTER — Encounter (HOSPITAL_COMMUNITY): Payer: Self-pay

## 2019-08-15 ENCOUNTER — Other Ambulatory Visit: Payer: Self-pay

## 2019-08-15 DIAGNOSIS — T7840XA Allergy, unspecified, initial encounter: Secondary | ICD-10-CM

## 2019-08-15 DIAGNOSIS — Z91040 Latex allergy status: Secondary | ICD-10-CM | POA: Insufficient documentation

## 2019-08-15 DIAGNOSIS — Z91010 Allergy to peanuts: Secondary | ICD-10-CM | POA: Insufficient documentation

## 2019-08-15 DIAGNOSIS — Z79899 Other long term (current) drug therapy: Secondary | ICD-10-CM | POA: Diagnosis not present

## 2019-08-15 DIAGNOSIS — R0989 Other specified symptoms and signs involving the circulatory and respiratory systems: Secondary | ICD-10-CM | POA: Diagnosis present

## 2019-08-15 MED ORDER — FAMOTIDINE 20 MG PO TABS: 20.0000 mg | ORAL_TABLET | Freq: Two times a day (BID) | ORAL | 0 refills | Status: DC

## 2019-08-15 MED ORDER — PREDNISONE 20 MG PO TABS: ORAL_TABLET | ORAL | 0 refills | Status: DC

## 2019-08-15 MED ORDER — DIPHENHYDRAMINE HCL 50 MG/ML IJ SOLN
25.0000 mg | Freq: Once | INTRAMUSCULAR | Status: AC
  Administered 2019-08-15: 25 mg via INTRAVENOUS
  Filled 2019-08-15: qty 1

## 2019-08-15 MED ORDER — FAMOTIDINE IN NACL 20-0.9 MG/50ML-% IV SOLN
20.0000 mg | Freq: Once | INTRAVENOUS | Status: AC
  Administered 2019-08-15: 02:00:00 20 mg via INTRAVENOUS
  Filled 2019-08-15: qty 50

## 2019-08-15 MED ORDER — METHYLPREDNISOLONE SODIUM SUCC 125 MG IJ SOLR
125.0000 mg | Freq: Once | INTRAMUSCULAR | Status: AC
  Administered 2019-08-15: 125 mg via INTRAVENOUS
  Filled 2019-08-15: qty 2

## 2019-08-15 NOTE — ED Triage Notes (Signed)
Pt believes she is having an allergic reaction after eating regular M&M's. Pt states that she is unsure if she has an allergy to peanuts, but states that she ate some an hour ago, 15 min later she began feeling tightness in her chest and throat and difficulty swallowing. Pt states she took 25mg  of an unknown allergy pill.

## 2019-08-15 NOTE — Discharge Instructions (Addendum)
Stay cool, drinking hot liquids or getting hot may make your throat feel like it swelling again.  Take the medications as prescribed.  You can take Benadryl or the allergy pill as needed if you feel like your throat is swelling again.  Return to the emergency department if you feel like you are unable to swallow, you are having difficulty breathing or you feel worse instead of better.

## 2019-08-15 NOTE — ED Provider Notes (Signed)
[facility] DEPT Provider Note   CSN: [id number] Arrival date & time: 08/15/19  0029   Time seen 1:16 AM  History Chief Complaint  Patient presents with  . Allergic Reaction    Veronica Buchanan is a 24 y.o. female.  HPI   Patient states about 2 hours ago she felt like her throat felt tight and then her chest felt tight.  She denies itching or rash.  She states she was able to swallow but it hurts to swallow.  She states she has felt lightheaded for the last 30 minutes and had a headache in her left temple that she describes as a shocking pain and achiness.  It started about 30 minutes ago and comes and goes.  She denies fever, cough, rhinorrhea.  She states her throat felt swollen however she took an allergy pill about midnight and it feels better now.  She states she has had something similar before but not as bad that she thought might be a peanut allergy.  She states she avoids peanut butter.  She states tonight she was eating regular M&Ms which she has never eaten before in her life right before this happened.  PCP Berkley Harvey, NP   Past Medical History:  Diagnosis Date  . Anxiety   . Bipolar disorder ([place])   . 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 Stage I.  . Osteochondroma   . Thyroid disease     Patient Active Problem List   Diagnosis Date Noted  . Chronic female pelvic pain 12/29/2018  . Endometriosis of pelvis 12/29/2018    Past Surgical History:  Procedure Laterality Date  . BIOPSY N/A 12/29/2018   Procedure: Peritoneal biopsies;  Surgeon: Osborne Oman, MD;  Location: [facility];  Service: Gynecology;  Laterality: N/A;  . LAPAROSCOPY N/A 12/29/2018   Procedure: LAPAROSCOPY DIAGNOSTIC;  Surgeon: Osborne Oman, MD;  Location: [facility];  Service: Gynecology;  Laterality: N/A;     OB History    Gravida  0   Para  0   Term  0   Preterm  0   AB  0   Living  0     SAB  0   TAB  0   Ectopic  0   Multiple  0   Live Births  0           Family History  Problem Relation Age of Onset  . Asthma Other   . Heart Problems Mother   . Ovarian cancer Mother 67  . Healthy Father   . Heart Problems Maternal Grandfather     Social History   Tobacco Use  . Smoking status: Never Smoker  . Smokeless tobacco: Never Used  Substance Use Topics  . Alcohol use: Yes    Comment: occasional (2 mos ago)  . Drug use: Yes    Types: Marijuana    Comment: one month ago    Home Medications Prior to Admission medications   Medication Sig Start Date End Date Taking? Authorizing Provider  albuterol (PROVENTIL HFA;VENTOLIN HFA) 108 (90 BASE) MCG/ACT inhaler Inhale 2 puffs into the lungs every 6 (six) hours as needed for wheezing or shortness of breath.     [provider]  beclomethasone (QVAR) 80 MCG/ACT inhaler Inhale 2 puffs into the lungs 2 (two) times daily.    [provider]  cetirizine (ZYRTEC) 10 MG tablet Take 10 mg by  mouth daily.    [provider]  doxepin (SINEQUAN) 25 MG capsule Take 25 mg by mouth.    Joline Salt, RN  famotidine (PEPCID) 20 MG tablet Take 1 tablet (20 mg total) by mouth 2 (two) times daily. 08/15/19   Rolland Porter, MD  fluticasone (FLONASE) 50 MCG/ACT nasal spray Place 1 spray into both nostrils daily.    [provider]  gabapentin (NEURONTIN) 100 MG capsule Take 1 capsule (100 mg total) by mouth 3 (three) times daily. 08/04/19   Domenic Moras, PA-C  ibuprofen (ADVIL) 800 MG tablet Take 1 tablet (800 mg total) by mouth every 8 (eight) hours as needed. 03/06/19   Sharion Balloon, NP  levonorgestrel (MIRENA) 20 MCG/24HR IUD 1 each by Intrauterine route once. Inserted 2017 per pt    [provider]  levothyroxine (SYNTHROID, LEVOTHROID) 25 MCG tablet Take 25 mcg by mouth daily before breakfast.    [provider]  lisdexamfetamine (VYVANSE)  50 MG capsule Take 50 mg by mouth daily.    [provider]  ondansetron (ZOFRAN ODT) 4 MG disintegrating tablet Take 1 tablet (4 mg total) by mouth every 8 (eight) hours as needed for nausea or vomiting. 06/08/19   Loura Halt A, NP  predniSONE (DELTASONE) 20 MG tablet Take 3 po QD x 3d , then 2 po QD x 3d then 1 po QD x 3d 08/15/19   Rolland Porter, MD  sertraline (ZOLOFT) 50 MG tablet Take 50 mg by mouth daily.    Joline Salt, RN  ziprasidone (GEODON) 20 MG capsule Take 20 mg by mouth 2 (two) times daily with a meal.     Joline Salt, RN    Allergies    Adhesive [tape], Shellfish allergy, Latex, and Peanut-containing drug products  Review of Systems   Review of Systems  All other systems reviewed and are negative.   Physical Exam Updated Vital Signs BP 106/64 (BP Location: Left Arm)   Pulse 62   Temp 98.3 F (36.8 C)   Resp 16   Ht 5\' 6"  (1.676 m)   Wt 63.5 kg   SpO2 100%   BMI 22.60 kg/m   Physical Exam Vitals and nursing note reviewed.  Constitutional:      Appearance: Normal appearance. She is normal weight.  HENT:     Head: Normocephalic and atraumatic.     Right Ear: External ear normal.     Left Ear: External ear normal.     Nose: Nose normal.     Mouth/Throat:     Mouth: Mucous membranes are moist.     Pharynx: No oropharyngeal exudate or posterior oropharyngeal erythema.     Comments: Uvula is midline, there is no swelling of her tongue or her oropharynx.  The soft palate is nonswollen.  Her voice is normal. Eyes:     Extraocular Movements: Extraocular movements intact.     Conjunctiva/sclera: Conjunctivae normal.     Pupils: Pupils are equal, round, and reactive to light.  Cardiovascular:     Rate and Rhythm: Normal rate and regular rhythm.  Pulmonary:     Effort: Pulmonary effort is normal. No respiratory distress.  Musculoskeletal:        General: Normal range of motion.     Cervical back: Normal range of motion.  Skin:    General: Skin is  warm and dry.  Neurological:     General: No focal deficit present.     Mental Status: She is  alert and oriented to person, place, and time.     Cranial Nerves: No cranial nerve deficit.  Psychiatric:        Mood and Affect: Mood normal.        Behavior: Behavior normal.        Thought Content: Thought content normal.     ED Results / Procedures / Treatments   Labs (all labs ordered are listed, but only abnormal results are displayed) Labs Reviewed - No data to display  EKG None  Radiology No results found.  Procedures Procedures (including critical care time)  Medications Ordered in ED Medications  methylPREDNISolone sodium succinate (SOLU-MEDROL) 125 mg/2 mL injection 125 mg (125 mg Intravenous Given 08/15/19 0139)  famotidine (PEPCID) IVPB 20 mg premix (0 mg Intravenous Stopped 08/15/19 0238)  diphenhydrAMINE (BENADRYL) injection 25 mg (25 mg Intravenous Given 08/15/19 0139)    ED Course  I have reviewed the triage vital signs and the nursing notes.  Pertinent labs & imaging results that were available during my care of the patient were reviewed by me and considered in my medical decision making (see chart for details).    MDM Rules/Calculators/A&P                      Patient was given IV Solu-Medrol, IV Pepcid, and IV Benadryl.  Recheck at 3:45 AM patient states she is feeling much better.  We discussed her going home with oral prednisone and Pepcid and to use Benadryl as needed.  She has already seen an allergist in the past.  We also discussed avoiding M&Ms in the future since that seem to be the trigger tonight.   Final Clinical Impression(s) / ED Diagnoses Final diagnoses:  Allergic reaction, initial encounter    Rx / DC Orders ED Discharge Orders         Ordered    predniSONE (DELTASONE) 20 MG tablet     08/15/19 0353    famotidine (PEPCID) 20 MG tablet  2 times daily     08/15/19 0353          Plan discharge  Rolland Porter, MD, Barbette Or, MD 08/15/19 [phone]

## 2019-08-22 ENCOUNTER — Ambulatory Visit: Payer: Medicare Other | Admitting: Physical Therapy

## 2019-08-22 ENCOUNTER — Other Ambulatory Visit: Payer: Self-pay

## 2019-08-22 ENCOUNTER — Encounter: Payer: Self-pay | Admitting: Physical Therapy

## 2019-08-22 DIAGNOSIS — M25511 Pain in right shoulder: Secondary | ICD-10-CM | POA: Diagnosis not present

## 2019-08-22 DIAGNOSIS — M25611 Stiffness of right shoulder, not elsewhere classified: Secondary | ICD-10-CM

## 2019-08-22 DIAGNOSIS — M542 Cervicalgia: Secondary | ICD-10-CM

## 2019-08-22 DIAGNOSIS — R252 Cramp and spasm: Secondary | ICD-10-CM

## 2019-08-22 NOTE — Therapy (Signed)
[facility] [place], [place], [address] Phone: [phone]   Fax:  [phone]  Physical Therapy Treatment  Patient Details  Name: Veronica Buchanan MRN: [id number] Date of Birth: 1995/09/10 Referring Provider (PT): Eldridge Abrahams   Encounter Date: 08/22/2019  PT End of Session - 08/22/19 1049    Visit Number  6    Date for PT Re-Evaluation  09/04/19    PT Start Time  1005    PT Stop Time  1040    PT Time Calculation (min)  35 min    Activity Tolerance  Patient limited by pain    Behavior During Therapy  [facility] for tasks assessed/performed       Past Medical History:  Diagnosis Date  . Anxiety   . Bipolar disorder ([place])   . 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 Stage I.  . Osteochondroma   . Thyroid disease     Past Surgical History:  Procedure Laterality Date  . BIOPSY N/A 12/29/2018   Procedure: Peritoneal biopsies;  Surgeon: Osborne Oman, MD;  Location: [facility];  Service: Gynecology;  Laterality: N/A;  . LAPAROSCOPY N/A 12/29/2018   Procedure: LAPAROSCOPY DIAGNOSTIC;  Surgeon: Osborne Oman, MD;  Location: [facility];  Service: Gynecology;  Laterality: N/A;    There were no vitals filed for this visit.  Subjective Assessment - 08/22/19 1014    Subjective  Patient comes in and reports that she has had a few MD visits and had to go to the ED due to spasms and neck pain.  The last time she saw [place] we had sent her to the MD due to her pain.  She is to have an MRI in the next few weeks,    Currently in Pain?  Yes    Pain Score  6     Pain Location  Neck    Pain Orientation  Right    Pain Descriptors / Indicators  Tightness;Spasm;Aching    Aggravating Factors   HA with looking down at the phone, at times right clavicle pain         [facility] PT Assessment - 08/22/19 0001      AROM   Overall AROM Comments   cervical ROM decreaed 50% with c/o pain and tightness in the neck and the right upper trap    Right Shoulder Flexion  112 Degrees    Right Shoulder ABduction  106 Degrees    Right Shoulder Internal Rotation  22 Degrees    Right Shoulder External Rotation  62 Degrees      Palpation   Palpation comment  the right Winfield joint is more prominent and raised , she is very tender and tight in the lright pectoral area reports some tignling in the right fingers, tender along the right SCM                   [facility] Adult PT Treatment/Exercise - 08/22/19 0001      Self-Care   Self-Care  Other Self-Care Comments    Other Self-Care Comments   talked with her about the anatomy and the mms that may be causing the issue, advised to take good notes and ask questions about the MRI when she talks iwth the MD tomorrow as it looks like I do not have access to the MRI in Epic      Moist Heat Therapy  Number Minutes Moist Heat  15 Minutes    Moist Heat Location  Cervical      Electrical Stimulation   Electrical Stimulation Location  cervical and Rt ant shoulder    Electrical Stimulation Action  IFC    Electrical Stimulation Parameters  supine    Electrical Stimulation Goals  Pain               PT Short Term Goals - 07/07/19 1604      PT SHORT TERM GOAL #1   Title  independent with initial HEP    Time  2    Period  Weeks    Status  New        PT Long Term Goals - 07/07/19 1604      PT LONG TERM GOAL #1   Title  understand posture and body mechanics    Time  8    Period  Weeks    Status  New      PT LONG TERM GOAL #2   Title  decreaes pain 50%    Time  8    Period  Weeks    Status  New      PT LONG TERM GOAL #3   Title  report no difficulty dressing and doing hair    Time  8    Period  Weeks    Status  New      PT LONG TERM GOAL #4   Title  demonstrat IR of the right shoulder that is >= 70 degrees    Time  8    Period  Weeks    Status  New             Plan - 08/22/19 1049    Clinical Impression Statement  Patient was seen here about 20 days ago, we adised her at that time to go to the MD if her symptoms got worse as she was having increase pain and some numbness iwth spasms.  She reports that she went to the ED and then her MD and was put on a different medicine and this helps some, she also reports that she has had an MRI, but will not get results until tomorrow.  She has lost ROM and now her Winfield joint is raised and more prominent than the left    PT Next Visit Plan  hold until MRI results tomorrow, I asked her to take good notes and ask questions as it appears that I do not have access to the MRI report    Consulted and Agree with Plan of Care  Patient       Patient will benefit from skilled therapeutic intervention in order to improve the following deficits and impairments:  Pain, Improper body mechanics, Postural dysfunction, Increased muscle spasms, Decreased range of motion, Decreased strength, Impaired UE functional use  Visit Diagnosis: Acute pain of right shoulder  Cervicalgia  Stiffness of right shoulder, not elsewhere classified  Cramp and spasm     Problem List Patient Active Problem List   Diagnosis Date Noted  . Chronic female pelvic pain 12/29/2018  . Endometriosis of pelvis 12/29/2018    Sumner Boast., PT 08/22/2019, 10:52 AM  [facility] [place], [place], [address] Phone: [phone]   Fax:  [phone]  Name: Veronica Buchanan MRN: [id number] Date of Birth: [birth year]

## 2019-09-01 ENCOUNTER — Other Ambulatory Visit: Payer: Self-pay

## 2019-09-01 ENCOUNTER — Ambulatory Visit: Payer: Medicare Other | Attending: Nurse Practitioner | Admitting: Physical Therapy

## 2019-09-01 ENCOUNTER — Encounter: Payer: Self-pay | Admitting: Physical Therapy

## 2019-09-01 DIAGNOSIS — R252 Cramp and spasm: Secondary | ICD-10-CM | POA: Diagnosis present

## 2019-09-01 DIAGNOSIS — M25511 Pain in right shoulder: Secondary | ICD-10-CM

## 2019-09-01 DIAGNOSIS — M542 Cervicalgia: Secondary | ICD-10-CM | POA: Insufficient documentation

## 2019-09-01 DIAGNOSIS — M25611 Stiffness of right shoulder, not elsewhere classified: Secondary | ICD-10-CM | POA: Diagnosis present

## 2019-09-01 NOTE — Therapy (Signed)
[place], [place], [address] Phone: [phone]   Fax:  [phone]  Physical Therapy Treatment  Patient Details  Name: Veronica Buchanan MRN: [id number] Date of Birth: Jun 02, 1995 Referring Provider (PT): Eldridge Abrahams   Encounter Date: 09/01/2019  PT End of Session - 09/01/19 1010    Visit Number  7    Date for PT Re-Evaluation  09/04/19    PT Start Time  0930    PT Stop Time  1005    PT Time Calculation (min)  35 min    Activity Tolerance  Patient tolerated treatment well    Behavior During Therapy  [facility] for tasks assessed/performed;Flat affect       Past Medical History:  Diagnosis Date  . Anxiety   . Bipolar disorder ([place])   . 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 Stage I.  . Osteochondroma   . Thyroid disease     Past Surgical History:  Procedure Laterality Date  . BIOPSY N/A 12/29/2018   Procedure: Peritoneal biopsies;  Surgeon: Osborne Oman, MD;  Location: [place];  Service: Gynecology;  Laterality: N/A;  . LAPAROSCOPY N/A 12/29/2018   Procedure: LAPAROSCOPY DIAGNOSTIC;  Surgeon: Osborne Oman, MD;  Location: [place];  Service: Gynecology;  Laterality: N/A;    There were no vitals filed for this visit.  Subjective Assessment - 09/01/19 0935    Subjective  "It has not been too bad, My main problem is when I sleep, or lean back in a chair. " Pt reports that MRI results showed  a lot of inflammation in shoulder and neck    Currently in Pain?  Yes    Pain Score  3     Pain Location  Shoulder    Pain Orientation  Right                       [facility] Adult PT Treatment/Exercise - 09/01/19 0001      Neck Exercises: Standing   Other Standing Exercises  AAROM cane flex to 90, exr, IR up back 2x10       Neck Exercises: Seated   Neck Retraction  5 reps;10 reps;3 secs    Other Seated Exercise  Rows yellow band 2x10    Other Seated Exercise  Shoulder ER yellow 2x10, Biceps curls 2lb 2x10                PT Short Term Goals - 07/07/19 1604      PT SHORT TERM GOAL #1   Title  independent with initial HEP    Time  2    Period  Weeks    Status  New        PT Long Term Goals - 07/07/19 1604      PT LONG TERM GOAL #1   Title  understand posture and body mechanics    Time  8    Period  Weeks    Status  New      PT LONG TERM GOAL #2   Title  decreaes pain 50%    Time  8    Period  Weeks    Status  New      PT LONG TERM GOAL #3   Title  report no difficulty dressing and doing hair    Time  8    Period  Weeks  Status  New      PT LONG TERM GOAL #4   Title  demonstrat IR of the right shoulder that is >= 70 degrees    Time  8    Period  Weeks    Status  New            Plan - 09/01/19 1011    Clinical Impression Statement  Pt stated that the MRI results showerd a lot if inflammation in the neck and shoulder. Added some light exercises to strengthen R shoulder and to get it moving. Initial flexion with cane cause a shock in the R shoulder and shaking per pt reports. While engaging in conversation with pt she was able to complete the other interventions without issues.    Stability/Clinical Decision Making  Stable/Uncomplicated    Rehab Potential  Good    PT Frequency  2x / week    PT Next Visit Plan  slowly progress with exercises       Patient will benefit from skilled therapeutic intervention in order to improve the following deficits and impairments:  Pain, Improper body mechanics, Postural dysfunction, Increased muscle spasms, Decreased range of motion, Decreased strength, Impaired UE functional use  Visit Diagnosis: Cramp and spasm  Stiffness of right shoulder, not elsewhere classified  Acute pain of right shoulder  Cervicalgia     Problem List Patient Active Problem List   Diagnosis Date Noted  . Chronic  female pelvic pain 12/29/2018  . Endometriosis of pelvis 12/29/2018    Veronica Buchanan, PTA 09/01/2019, 10:16 AM  [place], [place], [address] Phone: [phone]   Fax:  [phone]  Name: Veronica Buchanan MRN: [id number] Date of Birth: 01-Apr-1996

## 2019-09-08 ENCOUNTER — Ambulatory Visit: Payer: Medicare Other | Admitting: Physical Therapy

## 2019-09-08 ENCOUNTER — Other Ambulatory Visit: Payer: Self-pay

## 2019-09-08 DIAGNOSIS — R252 Cramp and spasm: Secondary | ICD-10-CM | POA: Diagnosis not present

## 2019-09-08 DIAGNOSIS — M25611 Stiffness of right shoulder, not elsewhere classified: Secondary | ICD-10-CM

## 2019-09-08 DIAGNOSIS — M25511 Pain in right shoulder: Secondary | ICD-10-CM

## 2019-09-08 NOTE — Therapy (Signed)
[facility] [address] [place], [place], [address] Phone: [phone]   Fax:  [phone]  Physical Therapy Treatment  Patient Details  Name: Emmagrace Cicchetti MRN: [id number] Date of Birth: Oct 12, 1995 Referring Provider (PT): Zoe Lan   Encounter Date: 09/08/2019  PT End of Session - 09/08/19 1648    Visit Number  8    PT Start Time  1623    PT Stop Time  1655    PT Time Calculation (min)  32 min       Past Medical History:  Diagnosis Date  . Anxiety   . Bipolar disorder ([facility])   . 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 Stage I.  . Osteochondroma   . Thyroid disease     Past Surgical History:  Procedure Laterality Date  . BIOPSY N/A 12/29/2018   Procedure: Peritoneal biopsies;  Surgeon: Tereso Newcomer, MD;  Location: [facility];  Service: Gynecology;  Laterality: N/A;  . LAPAROSCOPY N/A 12/29/2018   Procedure: LAPAROSCOPY DIAGNOSTIC;  Surgeon: Tereso Newcomer, MD;  Location: [facility];  Service: Gynecology;  Laterality: N/A;    There were no vitals filed for this visit.  Subjective Assessment - 09/08/19 1623    Subjective  8 min late. about the same. pain is just annoying right now. N/T comes and goes. "trying to use it more"    Currently in Pain?  Yes    Pain Score  3     Pain Location  Shoulder    Pain Orientation  Right         [facility] PT Assessment - 09/08/19 0001      AROM   Overall AROM Comments  cerv ROM WFLs    AROM Assessment Site  Shoulder    Right/Left Shoulder  Right    Right Shoulder Flexion  145 Degrees    Right Shoulder ABduction  132 Degrees   pt ver popping   Right Shoulder Internal Rotation  40 Degrees   painful   Right Shoulder External Rotation  85 Degrees                   [facility] Adult PT Treatment/Exercise - 09/08/19 0001      Self-Care   Self-Care  Other Self-Care  Comments    Other Self-Care Comments   reviewed use without over using, educ on keeping traps relaxed and ext to keep muscles relaxed      Neck Exercises: Machines for Strengthening   UBE (Upper Arm Bike)  L 3 2 fwd/2 back      Neck Exercises: Standing   Wall Push Ups  10 reps   15# tricep ext 15x   Other Standing Exercises  yellow tband shld ext and row 12 reps   yellow tband chest press ,flex and ER 12 reps   Other Standing Exercises  2# cane ex 15 reps 5 ways   standing postural wall angles 10x              PT Short Term Goals - 07/07/19 1604      PT SHORT TERM GOAL #1   Title  independent with initial HEP    Time  2    Period  Weeks    Status  New        PT Long Term Goals - 07/07/19 1604      PT LONG TERM GOAL #1  Title  understand posture and body mechanics    Time  8    Period  Weeks    Status  New      PT LONG TERM GOAL #2   Title  decreaes pain 50%    Time  8    Period  Weeks    Status  New      PT LONG TERM GOAL #3   Title  report no difficulty dressing and doing hair    Time  8    Period  Weeks    Status  New      PT LONG TERM GOAL #4   Title  demonstrat IR of the right shoulder that is >= 70 degrees    Time  8    Period  Weeks    Status  New            Plan - 09/08/19 1648    Clinical Impression Statement  pt verb less pain , has improved ROM and tolerated ther ex well with postural cuing and educ. educ on use without UE and safe progression of activity. pt is good spirits today and verb new meds helping with not having so many spasms and she is sick of not using it so doing more. progressing with goals    PT Treatment/Interventions  ADLs/Self Care Home Management;Cryotherapy;Ultrasound;Moist Heat;Electrical Stimulation;Iontophoresis 4mg /ml Dexamethasone;Therapeutic activities;Therapeutic exercise;Patient/family education;Manual techniques;Dry needling    PT Next Visit Plan  assess response to todays session       Patient will  benefit from skilled therapeutic intervention in order to improve the following deficits and impairments:  Pain, Improper body mechanics, Postural dysfunction, Increased muscle spasms, Decreased range of motion, Decreased strength, Impaired UE functional use  Visit Diagnosis: Cramp and spasm  Stiffness of right shoulder, not elsewhere classified  Acute pain of right shoulder     Problem List Patient Active Problem List   Diagnosis Date Noted  . Chronic female pelvic pain 12/29/2018  . Endometriosis of pelvis 12/29/2018    Jobeth Pangilinan,ANGIE PTA 09/08/2019, 4:51 PM  [facility] [address] [place], [place], [address] Phone: [phone]   Fax:  [phone]  Name: Saraelizabeth Bollenbach MRN: [id number] Date of Birth: 02-24-1996

## 2019-09-15 ENCOUNTER — Ambulatory Visit: Payer: Medicare Other | Admitting: Physical Therapy

## 2019-09-15 ENCOUNTER — Encounter: Payer: Self-pay | Admitting: Physical Therapy

## 2019-09-15 ENCOUNTER — Other Ambulatory Visit: Payer: Self-pay

## 2019-09-15 DIAGNOSIS — M25511 Pain in right shoulder: Secondary | ICD-10-CM

## 2019-09-15 DIAGNOSIS — M25611 Stiffness of right shoulder, not elsewhere classified: Secondary | ICD-10-CM

## 2019-09-15 DIAGNOSIS — R252 Cramp and spasm: Secondary | ICD-10-CM | POA: Diagnosis not present

## 2019-09-15 DIAGNOSIS — M542 Cervicalgia: Secondary | ICD-10-CM

## 2019-09-15 NOTE — Therapy (Signed)
[place], [place], [address] Phone: [phone]   Fax:  [phone]  Physical Therapy Treatment  Patient Details  Name: Veronica Buchanan MRN: [id number] Date of Birth: [birth year] Referring Provider (PT): Eldridge Abrahams   Encounter Date: 09/15/2019  PT End of Session - 09/15/19 1139    Visit Number  9    Date for PT Re-Evaluation  09/04/19    PT Start Time  1113    PT Stop Time  1142    PT Time Calculation (min)  29 min    Activity Tolerance  Patient tolerated treatment well    Behavior During Therapy  [facility] for tasks assessed/performed;Flat affect       Past Medical History:  Diagnosis Date  . Anxiety   . Bipolar disorder ([place])   . 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 Stage I.  . Osteochondroma   . Thyroid disease     Past Surgical History:  Procedure Laterality Date  . BIOPSY N/A 12/29/2018   Procedure: Peritoneal biopsies;  Surgeon: Osborne Oman, MD;  Location: [place];  Service: Gynecology;  Laterality: N/A;  . LAPAROSCOPY N/A 12/29/2018   Procedure: LAPAROSCOPY DIAGNOSTIC;  Surgeon: Osborne Oman, MD;  Location: [place];  Service: Gynecology;  Laterality: N/A;    There were no vitals filed for this visit.  Subjective Assessment - 09/15/19 1114    Subjective  "Im ok, tired"    Currently in Pain?  No/denies         [facility] PT Assessment - 09/15/19 0001      AROM   Right Shoulder Flexion  155 Degrees    Right Shoulder ABduction  140 Degrees   pain and popping per report   Right Shoulder Internal Rotation  35 Degrees    Right Shoulder External Rotation  90 Degrees                   [facility] Adult PT Treatment/Exercise - 09/15/19 0001      Neck Exercises: Machines for Strengthening   UBE (Upper Arm Bike)  L 3 2 fwd/2 back      Neck Exercises: Standing   Wall Push  Ups  20 reps    Other Standing Exercises  yellow tband shld ext and row 2x15; Flex & abd 2lb 2x10 each     Other Standing Exercises  ER yelllow 2x10, IR yellow RUE 2x10                PT Short Term Goals - 07/07/19 1604      PT SHORT TERM GOAL #1   Title  independent with initial HEP    Time  2    Period  Weeks    Status  New        PT Long Term Goals - 09/15/19 1121      PT LONG TERM GOAL #1   Title  understand posture and body mechanics    Status  Partially Met      PT LONG TERM GOAL #2   Title  decreaes pain 50%    Status  On-going      PT LONG TERM GOAL #3   Status  Partially Met      PT LONG TERM GOAL #4   Title  demonstrat IR of the right shoulder that is >= 70 degrees  Plan - 09/15/19 1141    Clinical Impression Statement  Pt  14 minutes late today, she reported a twisting stomach pain in her stomach after last session. Since then it has happen about three times. She did really well overall today completing all the interventions. He has progressed increasing her R shoulder AROM but remains limited with internal rotation. She continues to report difficulty doing her hair.    Stability/Clinical Decision Making  Stable/Uncomplicated    Rehab Potential  Good    PT Frequency  2x / week    PT Duration  8 weeks    PT Treatment/Interventions  ADLs/Self Care Home Management;Cryotherapy;Ultrasound;Moist Heat;Electrical Stimulation;Iontophoresis 14m/ml Dexamethasone;Therapeutic activities;Therapeutic exercise;Patient/family education;Manual techniques;Dry needling    PT Next Visit Plan  assess response to today's session. Progress with shoulder staility as tolerated       Patient will benefit from skilled therapeutic intervention in order to improve the following deficits and impairments:  Pain, Improper body mechanics, Postural dysfunction, Increased muscle spasms, Decreased range of motion, Decreased strength, Impaired UE functional use  Visit  Diagnosis: Cramp and spasm  Stiffness of right shoulder, not elsewhere classified  Acute pain of right shoulder  Cervicalgia     Problem List Patient Active Problem List   Diagnosis Date Noted  . Chronic female pelvic pain 12/29/2018  . Endometriosis of pelvis 12/29/2018    RScot Jun PTA 09/15/2019, 11:44 AM  [place] [address]Phone: [phone]  Fax:  [phone] Name: Veronica RamosMRN: [id number] of Birth: [birth year]

## 2019-09-20 ENCOUNTER — Encounter: Payer: Self-pay | Admitting: Obstetrics and Gynecology

## 2019-09-20 ENCOUNTER — Ambulatory Visit (INDEPENDENT_AMBULATORY_CARE_PROVIDER_SITE_OTHER): Payer: Medicare Other | Admitting: Obstetrics and Gynecology

## 2019-09-20 ENCOUNTER — Other Ambulatory Visit: Payer: Self-pay

## 2019-09-20 ENCOUNTER — Other Ambulatory Visit (HOSPITAL_COMMUNITY)
Admission: RE | Admit: 2019-09-20 | Discharge: 2019-09-20 | Disposition: A | Discharge status: Home / Self Care | Payer: Medicare Other | Source: Ambulatory Visit | Attending: Obstetrics and Gynecology | Admitting: Obstetrics and Gynecology

## 2019-09-20 VITALS — BP 107/71 | HR 81 | Temp 98.8°F | Ht 66.0 in | Wt 147.0 lb

## 2019-09-20 DIAGNOSIS — R109 Unspecified abdominal pain: Secondary | ICD-10-CM | POA: Insufficient documentation

## 2019-09-20 MED ORDER — IBUPROFEN 600 MG PO TABS
600.0000 mg | ORAL_TABLET | Freq: Four times a day (QID) | ORAL | 1 refills | Status: DC | PRN
Start: 2019-09-20 — End: 2020-02-16

## 2019-09-20 NOTE — Progress Notes (Signed)
24 yo P0 here for evaluation of abdominal pain. Patient reports onset of the pain a month ago without improvement. She describes the pain as a twisting pain minimally relieved by tylenol. Patient is sexually active without complaints. She is using IUD for contraception and endometriosis control. She reports a monthly period lasting 4 days without dysmenorrhea. She is uncertain if her RLQ pain worsened with the onset of her cycle.  Past Medical History:  Diagnosis Date  . Anxiety   . Bipolar disorder ([place])   . 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 Stage I.  . Osteochondroma   . Thyroid disease    Past Surgical History:  Procedure Laterality Date  . BIOPSY N/A 12/29/2018   Procedure: Peritoneal biopsies;  Surgeon: Osborne Oman, MD;  Location: [facility];  Service: Gynecology;  Laterality: N/A;  . LAPAROSCOPY N/A 12/29/2018   Procedure: LAPAROSCOPY DIAGNOSTIC;  Surgeon: Osborne Oman, MD;  Location: [employer];  Service: Gynecology;  Laterality: N/A;   Family History  Problem Relation Age of Onset  . Asthma Other   . Heart Problems Mother   . Ovarian cancer Mother 64  . Healthy Father   . Heart Problems Maternal Grandfather    Social History   Tobacco Use  . Smoking status: Never Smoker  . Smokeless tobacco: Never Used  Substance Use Topics  . Alcohol use: Yes    Comment: occasional (2 mos ago)  . Drug use: Yes    Types: Marijuana    Comment: one month ago   ROS See pertinent in HPI. All other symptoms reviewed and negative  Blood pressure 107/71, pulse 81, temperature 98.8 F (37.1 C), height 5\' 6"  (1.676 m), weight 147 lb (66.7 kg), last menstrual period 09/05/2019. GENERAL: Well-developed, well-nourished female in no acute distress.  ABDOMEN: Soft, nontender, nondistended. No organomegaly. PELVIC: Normal external female genitalia. Vagina is pink and rugated.  Normal  discharge. Normal appearing cervix. IUD strings not visualized. Uterus is normal in size. Right adnexal fullness without tenderness. EXTREMITIES: No cyanosis, clubbing, or edema, 2+ distal pulses.  A/P 24 yo with RLQ pain - vaginal swab collected - pelvic ultrasound ordered - Patient will be contacted with abnormal results

## 2019-09-20 NOTE — Progress Notes (Signed)
GYN presents for abdominal pain 7-8/10 x 1 month, nausea.  Denies discharge, odor, fever, chills.

## 2019-09-21 LAB — CERVICOVAGINAL ANCILLARY ONLY
Bacterial Vaginitis (gardnerella): NEGATIVE
Candida Glabrata: NEGATIVE
Candida Vaginitis: NEGATIVE
Chlamydia: NEGATIVE
Comment: NEGATIVE
Comment: NEGATIVE
Comment: NEGATIVE
Comment: NEGATIVE
Comment: NEGATIVE
Comment: NORMAL
Neisseria Gonorrhea: NEGATIVE
Trichomonas: NEGATIVE

## 2019-09-22 ENCOUNTER — Ambulatory Visit: Payer: Medicare Other | Admitting: Physical Therapy

## 2019-09-22 ENCOUNTER — Other Ambulatory Visit: Payer: Self-pay

## 2019-09-22 DIAGNOSIS — R252 Cramp and spasm: Secondary | ICD-10-CM

## 2019-09-22 DIAGNOSIS — M25511 Pain in right shoulder: Secondary | ICD-10-CM

## 2019-09-22 DIAGNOSIS — M542 Cervicalgia: Secondary | ICD-10-CM

## 2019-09-22 DIAGNOSIS — M25611 Stiffness of right shoulder, not elsewhere classified: Secondary | ICD-10-CM

## 2019-09-22 NOTE — Therapy (Signed)
[place], [place], [address] Phone: [phone]   Fax:  [phone]  Physical Therapy Treatment  Patient Details  Name: Kennetha Pearman MRN: [id number] Date of Birth: [birth year] Referring Provider (PT): Eldridge Abrahams   Encounter Date: 09/22/2019  PT End of Session - 09/22/19 1621    PT Start Time  3235    PT Stop Time  1618    PT Time Calculation (min)  47 min    Activity Tolerance  Patient tolerated treatment well    Behavior During Therapy  [facility] for tasks assessed/performed;Flat affect       Past Medical History:  Diagnosis Date  . Anxiety   . Bipolar disorder ([place])   . 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 Stage I.  . Osteochondroma   . Thyroid disease     Past Surgical History:  Procedure Laterality Date  . BIOPSY N/A 12/29/2018   Procedure: Peritoneal biopsies;  Surgeon: Osborne Oman, MD;  Location: [facility];  Service: Gynecology;  Laterality: N/A;  . LAPAROSCOPY N/A 12/29/2018   Procedure: LAPAROSCOPY DIAGNOSTIC;  Surgeon: Osborne Oman, MD;  Location: [place];  Service: Gynecology;  Laterality: N/A;    There were no vitals filed for this visit.  Subjective Assessment - 09/22/19 1531    Subjective  says she is just tired, reports that shld ext causesthe most pain    Currently in Pain?  Yes    Pain Score  5     Pain Location  Neck    Pain Orientation  Right                       [facility] Adult PT Treatment/Exercise - 09/22/19 0001      Exercises   Exercises  Shoulder      Neck Exercises: Machines for Strengthening   UBE (Upper Arm Bike)  L2, 3 min fwd, 3 back      Shoulder Exercises: Standing   External Rotation  Strengthening;Right;Theraband    Theraband Level (Shoulder External Rotation)  Level 1 (Yellow)    Internal Rotation  Strengthening;Right;Theraband     Theraband Level (Shoulder Internal Rotation)  Level 1 (Yellow)    Flexion  Strengthening;Both;20 reps;Weights    Shoulder Flexion Weight (lbs)  2#    ABduction  Strengthening;Both;20 reps;Weights    Shoulder ABduction Weight (lbs)  2#    Extension  Both;20 reps;Theraband;Strengthening    Theraband Level (Shoulder Extension)  Level 1 (Yellow)    Row  Both;20 reps;Theraband    Theraband Level (Shoulder Row)  Level 1 (Yellow)    Other Standing Exercises  shld press, 2x10, 3#    Other Standing Exercises  shld flex w/ ball, 2x10      Shoulder Exercises: ROM/Strengthening   Cybex Press  1 plate   serratus punches, 2x10              PT Short Term Goals - 09/22/19 1617      PT SHORT TERM GOAL #1   Title  independent with initial HEP    Status  Achieved        PT Long Term Goals - 09/22/19 1614      PT LONG TERM GOAL #1   Title  understand posture and body mechanics    Status  Partially Met      PT LONG TERM GOAL #2  Title  decreaes pain 50%    Status  Achieved      PT LONG TERM GOAL #3   Title  report no difficulty dressing and doing hair    Status  Partially Met            Plan - 09/22/19 1638    Clinical Impression Statement  Pt completed the exercises well today, she seemed in good spirits, visible discomfort during IR w/ tband, she was able to verbalize and demonstrate the exercises from her HEP, she reported she wasn't in much pain and her R shld felt good during the exercises, she still has difficulty doing her hair.    PT Treatment/Interventions  ADLs/Self Care Home Management;Cryotherapy;Ultrasound;Moist Heat;Electrical Stimulation;Iontophoresis 40m/ml Dexamethasone;Therapeutic activities;Therapeutic exercise;Patient/family education;Manual techniques;Dry needling    PT Next Visit Plan  assess response to today's session. Progress with shoulder stability as tolerated       Patient will benefit from skilled therapeutic intervention in order to improve  the following deficits and impairments:  Pain, Improper body mechanics, Postural dysfunction, Increased muscle spasms, Decreased range of motion, Decreased strength, Impaired UE functional use  Visit Diagnosis: Cramp and spasm  Stiffness of right shoulder, not elsewhere classified  Acute pain of right shoulder  Cervicalgia     Problem List Patient Active Problem List   Diagnosis Date Noted  . Chronic female pelvic pain 12/29/2018  . Endometriosis of pelvis 12/29/2018    BClarene Essex SPTA 09/22/2019, 4:45 PM  [place] [address]Phone: [phone]  Fax:  [phone] Name: JCarolyne WhitselMRN: [id number] of Birth: [birth year]

## 2019-09-29 ENCOUNTER — Ambulatory Visit (HOSPITAL_COMMUNITY)
Admission: RE | Admit: 2019-09-29 | Discharge: 2019-09-29 | Disposition: A | Discharge status: Home / Self Care | Payer: Medicare Other | Source: Ambulatory Visit | Attending: Obstetrics and Gynecology | Admitting: Obstetrics and Gynecology

## 2019-09-29 ENCOUNTER — Other Ambulatory Visit: Payer: Self-pay

## 2019-09-29 DIAGNOSIS — R109 Unspecified abdominal pain: Secondary | ICD-10-CM

## 2019-10-05 ENCOUNTER — Ambulatory Visit: Payer: Medicare Other | Admitting: Physical Therapy

## 2019-10-10 ENCOUNTER — Telehealth: Payer: Self-pay

## 2019-10-10 NOTE — Telephone Encounter (Signed)
Returned call and advised of results 

## 2019-10-12 ENCOUNTER — Encounter: Payer: Self-pay | Admitting: Physical Therapy

## 2019-10-12 ENCOUNTER — Other Ambulatory Visit: Payer: Self-pay

## 2019-10-12 ENCOUNTER — Ambulatory Visit: Payer: Medicare Other | Attending: Nurse Practitioner | Admitting: Physical Therapy

## 2019-10-12 DIAGNOSIS — R252 Cramp and spasm: Secondary | ICD-10-CM | POA: Diagnosis not present

## 2019-10-12 DIAGNOSIS — M542 Cervicalgia: Secondary | ICD-10-CM | POA: Diagnosis present

## 2019-10-12 DIAGNOSIS — M25611 Stiffness of right shoulder, not elsewhere classified: Secondary | ICD-10-CM | POA: Insufficient documentation

## 2019-10-12 DIAGNOSIS — M25511 Pain in right shoulder: Secondary | ICD-10-CM | POA: Diagnosis present

## 2019-10-12 NOTE — Therapy (Signed)
[place], [place], [address] Phone: [phone]   Fax:  [phone] Progress Note Reporting Period 07/06/19 to 10/12/19 for the first 10 visits  See note below for Objective Data and Assessment of Progress/Goals.      Physical Therapy Treatment  Patient Details  Name: Veronica Buchanan MRN: [id number] Date of Birth: 11-27-1995 Referring Provider (PT): Eldridge Abrahams   Encounter Date: 10/12/2019  PT End of Session - 10/12/19 1125    Visit Number  10    Date for PT Re-Evaluation  10/08/19    PT Start Time  1104    PT Stop Time  1125    PT Time Calculation (min)  21 min    Activity Tolerance  Patient tolerated treatment well    Behavior During Therapy  [facility] for tasks assessed/performed;Flat affect       Past Medical History:  Diagnosis Date  . Anxiety   . Bipolar disorder ([place])   . 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 Stage I.  . Osteochondroma   . Thyroid disease     Past Surgical History:  Procedure Laterality Date  . BIOPSY N/A 12/29/2018   Procedure: Peritoneal biopsies;  Surgeon: Osborne Oman, MD;  Location: [place];  Service: Gynecology;  Laterality: N/A;  . LAPAROSCOPY N/A 12/29/2018   Procedure: LAPAROSCOPY DIAGNOSTIC;  Surgeon: Osborne Oman, MD;  Location: [place];  Service: Gynecology;  Laterality: N/A;    There were no vitals filed for this visit.  Subjective Assessment - 10/12/19 1108    Subjective  Pt reports that she has a episode of anger and tore up her apartment. Since then she reports not being able to lift her arm.    Currently in Pain?  Yes    Pain Score  6     Pain Location  --   side of shoulder and back   Pain Orientation  Left         [facility] PT Assessment - 10/12/19 0001      AROM   Right Shoulder Flexion  136 Degrees    Right Shoulder ABduction  78  Degrees    Right Shoulder Internal Rotation  14 Degrees    Right Shoulder External Rotation  93 Degrees                    [facility] Adult PT Treatment/Exercise - 10/12/19 0001      Neck Exercises: Machines for Strengthening   UBE (Upper Arm Bike)  L2, 3 min fwd, 3 back      Neck Exercises: Standing   Other Standing Exercises  yellow tband shld ext and row 2x15; Flex 2lb 2x10     Other Standing Exercises  ER yelllow 2x10, IR yellow RUE 2x10                PT Short Term Goals - 09/22/19 1617      PT SHORT TERM GOAL #1   Title  independent with initial HEP    Status  Achieved        PT Long Term Goals - 09/22/19 1614      PT LONG TERM GOAL #1   Title  understand posture and body mechanics    Status  Partially Met      PT LONG TERM GOAL #2   Title  decreaes pain 50%  Status  Achieved      PT LONG TERM GOAL #3   Title  report no difficulty dressing and doing hair    Status  Partially Met            Plan - 10/12/19 1126    Clinical Impression Statement  Pt ~ 4 minutes late today and suggested a shorten treatment session due to having a MD apt. Pt enters clinic reporting increase pain and decrease RUE ROM. She has lost some motion in RUE AROM. Muscle spasms reported near medial R scapular boarder with rows.    Stability/Clinical Decision Making  Stable/Uncomplicated    Rehab Potential  Good    PT Frequency  2x / week    PT Duration  8 weeks    PT Treatment/Interventions  ADLs/Self Care Home Management;Cryotherapy;Ultrasound;Moist Heat;Electrical Stimulation;Iontophoresis 12m/ml Dexamethasone;Therapeutic activities;Therapeutic exercise;Patient/family education;Manual techniques;Dry needling    PT Next Visit Plan  Get report from MD       Patient will benefit from skilled therapeutic intervention in order to improve the following deficits and impairments:     Visit Diagnosis: Cramp and spasm  Acute pain of right shoulder  Stiffness of right  shoulder, not elsewhere classified  Cervicalgia     Problem List Patient Active Problem List   Diagnosis Date Noted  . Chronic female pelvic pain 12/29/2018  . Endometriosis of pelvis 12/29/2018    RScot Jun PTA 10/12/2019, 11:28 AM  [place] [address]Phone: [phone]  Fax:  [phone] Name: JJayelyn BarnoMRN: [id number] of Birth: 8December 18, 1997

## 2019-10-16 ENCOUNTER — Ambulatory Visit (INDEPENDENT_AMBULATORY_CARE_PROVIDER_SITE_OTHER)
Admission: RE | Admit: 2019-10-16 | Discharge: 2019-10-16 | Disposition: A | Discharge status: Home / Self Care | Payer: Medicare Other | Source: Ambulatory Visit

## 2019-10-16 DIAGNOSIS — M62830 Muscle spasm of back: Secondary | ICD-10-CM

## 2019-10-16 DIAGNOSIS — M542 Cervicalgia: Secondary | ICD-10-CM | POA: Diagnosis not present

## 2019-10-16 DIAGNOSIS — G8929 Other chronic pain: Secondary | ICD-10-CM

## 2019-10-16 DIAGNOSIS — R200 Anesthesia of skin: Secondary | ICD-10-CM

## 2019-10-16 MED ORDER — TIZANIDINE HCL 4 MG PO TABS
4.0000 mg | ORAL_TABLET | Freq: Three times a day (TID) | ORAL | 0 refills | Status: DC | PRN
Start: 2019-10-16 — End: 2019-12-06

## 2019-10-16 MED ORDER — MELOXICAM 15 MG PO TABS
15.0000 mg | ORAL_TABLET | Freq: Every day | ORAL | 0 refills | Status: DC
Start: 2019-10-16 — End: 2019-11-23

## 2019-10-16 NOTE — ED Provider Notes (Signed)
Virtual Visit via Video Note:  Veronica Buchanan  initiated request for Telemedicine visit with [facility] team. I connected with Karina Zimmermann  on 10/16/2019 at 11:58 AM  for a synchronized telemedicine visit using a video enabled HIPPA compliant telemedicine application. I verified that I am speaking with Veronica Buchanan  using two identifiers. Jaynee Eagles, PA-C  was physically located in a [facility]gent [facility] site and Amylynn Janvrin was located at a different location.   The limitations of evaluation and management by telemedicine as well as the availability of in-person appointments were discussed. Patient was informed that she  may incur a bill ( including co-pay) for this virtual visit encounter. Veronica Buchanan  expressed understanding and gave verbal consent to proceed with virtual visit.     History of Present Illness:Veronica Buchanan  is a 24 y.o. female presents with several month history of persistent pains from a fall down the stairs, caused dislocation of her clavicle. Has had persistent neck pain, muscles spasms, back pain. Has been working with an Secondary school teacher and PT, PCP. Has had x-rays, MRI (~1-2 months ago). Reports that the MRI was normal.  Was advised to go to an urgent care or ER this weekend. Has been using gabapentin, cyclobenzaprine, ibuprofen, Icy Hot, APAP. Stopped gabapentin due to medication reaction as advised in the ER.  This was back in March and was given IM Solu-Medrol, oral steroid taper thereafter.  ROS  Current Outpatient Medications  Medication Sig Dispense Refill  . albuterol (PROVENTIL HFA;VENTOLIN HFA) 108 (90 BASE) MCG/ACT inhaler Inhale 2 puffs into the lungs every 6 (six) hours as needed for wheezing or shortness of breath.     . beclomethasone (QVAR) 80 MCG/ACT inhaler Inhale 2 puffs into the lungs 2 (two) times daily.    . cetirizine (ZYRTEC) 10 MG tablet Take 10 mg by mouth daily.    Marland Kitchen doxepin (SINEQUAN) 25 MG capsule Take 25 mg by mouth.    . famotidine  (PEPCID) 20 MG tablet Take 1 tablet (20 mg total) by mouth 2 (two) times daily. 18 tablet 0  . fluticasone (FLONASE) 50 MCG/ACT nasal spray Place 1 spray into both nostrils daily.    Marland Kitchen gabapentin (NEURONTIN) 100 MG capsule Take 1 capsule (100 mg total) by mouth 3 (three) times daily. 30 capsule 0  . ibuprofen (ADVIL) 600 MG tablet Take 1 tablet (600 mg total) by mouth every 6 (six) hours as needed. 30 tablet 1  . ibuprofen (ADVIL) 800 MG tablet Take 1 tablet (800 mg total) by mouth every 8 (eight) hours as needed. 21 tablet 0  . levonorgestrel (MIRENA) 20 MCG/24HR IUD 1 each by Intrauterine route once. Inserted 2017 per pt    . levothyroxine (SYNTHROID, LEVOTHROID) 25 MCG tablet Take 25 mcg by mouth daily before breakfast.    . lisdexamfetamine (VYVANSE) 50 MG capsule Take 50 mg by mouth daily.    . ondansetron (ZOFRAN ODT) 4 MG disintegrating tablet Take 1 tablet (4 mg total) by mouth every 8 (eight) hours as needed for nausea or vomiting. 20 tablet 0  . predniSONE (DELTASONE) 20 MG tablet Take 3 po QD x 3d , then 2 po QD x 3d then 1 po QD x 3d 18 tablet 0  . sertraline (ZOLOFT) 50 MG tablet Take 50 mg by mouth daily.    . ziprasidone (GEODON) 20 MG capsule Take 20 mg by mouth 2 (two) times daily with a meal.      No current facility-administered  medications for this encounter.     Allergies  Allergen Reactions  . Adhesive [Tape] Dermatitis  . Shellfish Allergy Swelling  . Latex Itching    burning  . Peanut-Containing Drug Products     Mouth swelling      Past Medical History:  Diagnosis Date  . Anxiety   . Bipolar disorder ([place])   . 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 Stage I.  . Osteochondroma   . Thyroid disease     Past Surgical History:  Procedure Laterality Date  . BIOPSY N/A 12/29/2018   Procedure: Peritoneal biopsies;  Surgeon: Osborne Oman, MD;  Location: [place];  Service:  Gynecology;  Laterality: N/A;  . LAPAROSCOPY N/A 12/29/2018   Procedure: LAPAROSCOPY DIAGNOSTIC;  Surgeon: Osborne Oman, MD;  Location: [place];  Service: Gynecology;  Laterality: N/A;      Observations/Objective: Physical Exam Had to do visit by phone only, mic not working.  Assessment and Plan:  PDMP not reviewed this encounter.  1. Chronic neck pain   2. Numbness   3. Spasm of back muscles    Recommended patient continue follow-up with her orthopedist and PT.  She does not have alarming symptoms of new onset weakness or trauma and therefore did not recommend coming in unless the symptoms develop.  For now we will hold off on using an oral steroid course given that she was prescribed this in March.  Stop ibuprofen and switch to meloxicam.  Stop cyclobenzaprine and switch to tizanidine. Counseled patient on potential for adverse effects with medications prescribed/recommended today, ER and return-to-clinic precautions discussed, patient verbalized understanding.    Follow Up Instructions:    I discussed the assessment and treatment plan with the patient. The patient was provided an opportunity to ask questions and all were answered. The patient agreed with the plan and demonstrated an understanding of the instructions.   The patient was advised to call back or seek an in-person evaluation if the symptoms worsen or if the condition fails to improve as anticipated.  I provided 15 minutes of non-face-to-face time during this encounter.    Jaynee Eagles, PA-C  10/16/2019 11:58 AM         Jaynee Eagles, PA-C 10/16/19 1213

## 2019-10-18 ENCOUNTER — Encounter (HOSPITAL_COMMUNITY): Payer: Self-pay | Admitting: Emergency Medicine

## 2019-10-18 ENCOUNTER — Emergency Department (HOSPITAL_COMMUNITY)
Admission: EM | Admit: 2019-10-18 | Discharge: 2019-10-18 | Disposition: A | Discharge status: Home / Self Care | Payer: Medicare Other | Attending: Emergency Medicine | Admitting: Emergency Medicine

## 2019-10-18 DIAGNOSIS — E079 Disorder of thyroid, unspecified: Secondary | ICD-10-CM | POA: Diagnosis not present

## 2019-10-18 DIAGNOSIS — M549 Dorsalgia, unspecified: Secondary | ICD-10-CM | POA: Diagnosis not present

## 2019-10-18 DIAGNOSIS — M62838 Other muscle spasm: Secondary | ICD-10-CM | POA: Diagnosis not present

## 2019-10-18 DIAGNOSIS — M542 Cervicalgia: Secondary | ICD-10-CM | POA: Diagnosis not present

## 2019-10-18 LAB — BASIC METABOLIC PANEL
Anion gap: 11 (ref 5–15)
BUN: 17 mg/dL (ref 6–20)
CO2: 22 mmol/L (ref 22–32)
Calcium: 9.5 mg/dL (ref 8.9–10.3)
Chloride: 101 mmol/L (ref 98–111)
Creatinine, Ser: 0.8 mg/dL (ref 0.44–1.00)
GFR calc Af Amer: >60 mL/min (ref >60)
GFR calc non Af Amer: >60 mL/min (ref >60)
Glucose, Bld: 111 mg/dL — ABNORMAL HIGH (ref 70–99)
Potassium: 3.8 mmol/L (ref 3.5–5.1)
Sodium: 134 mmol/L — ABNORMAL LOW (ref 135–145)

## 2019-10-18 MED ORDER — NAPROXEN 500 MG PO TABS: 250.0000 mg | ORAL_TABLET | Freq: Two times a day (BID) | ORAL | 0 refills | Status: DC

## 2019-10-18 MED ORDER — KETOROLAC TROMETHAMINE 60 MG/2ML IM SOLN
60.0000 mg | Freq: Once | INTRAMUSCULAR | Status: AC
  Administered 2019-10-18: 60 mg via INTRAMUSCULAR
  Filled 2019-10-18: qty 2

## 2019-10-18 MED ORDER — METHOCARBAMOL 500 MG PO TABS
500.0000 mg | ORAL_TABLET | Freq: Two times a day (BID) | ORAL | 0 refills | Status: DC
Start: 2019-10-18 — End: 2019-11-23

## 2019-10-18 NOTE — Discharge Instructions (Addendum)
Your blood work today did not show any evidence of electrolyte abnormalities causing your muscle spasms.  We will try a different muscle relaxer called Robaxin.  I have also prescribed a different anti-inflammatory called naproxen.  Please follow-up with your primary care doctor within the week for additional work-up if needed.  Return to the ER if you develop fevers, chills, numbness in your groin, inability to hold your stool, increasing numbness and weakness in your legs.

## 2019-10-18 NOTE — ED Provider Notes (Signed)
[name] DEPT Provider Note   CSN: [id number] Arrival date & time: 10/18/19  1316     History Chief Complaint  Patient presents with  . Spasms    Veronica Buchanan is a 24 y.o. female.  HPI 24 year old female with a history of thyroid disease, bipolar disorder, anxiety, endometriosis presents to the ER for generalized muscle spasms, back pain.  Patient states that she fell approximately 4 months ago and had a dislocated left clavicle.  She since then, she has been on an array of anti-inflammatories and muscle relaxers, has also seen in PT for neck pain, back pain, rib pain and body muscle spasms.  States that sometimes she feels as though the spasms are "in her spine".  She states that these pains come and go, but the last 4 days they have been constant.  She has tried tizanidine and Flexeril with little relief.  Denies any chest pain, shortness of breath, fevers or chills, IVDU, groin numbness, incontinence of bowel and bladder, unintended weight loss, night sweats.  She is asking if her symptoms could be secondary to sickle cell as that is what her mother instructed her to ask.    Past Medical History:  Diagnosis Date  . Anxiety   . Bipolar disorder ([place])   . 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 Stage I.  . Osteochondroma   . Thyroid disease     Patient Active Problem List   Diagnosis Date Noted  . Chronic female pelvic pain 12/29/2018  . Endometriosis of pelvis 12/29/2018    Past Surgical History:  Procedure Laterality Date  . BIOPSY N/A 12/29/2018   Procedure: Peritoneal biopsies;  Surgeon: Osborne Oman, MD;  Location: [place];  Service: Gynecology;  Laterality: N/A;  . LAPAROSCOPY N/A 12/29/2018   Procedure: LAPAROSCOPY DIAGNOSTIC;  Surgeon: Osborne Oman, MD;  Location: [place];  Service: Gynecology;  Laterality: N/A;     OB  History    Gravida  0   Para  0   Term  0   Preterm  0   AB  0   Living  0     SAB  0   TAB  0   Ectopic  0   Multiple  0   Live Births  0           Family History  Problem Relation Age of Onset  . Asthma Other   . Heart Problems Mother   . Ovarian cancer Mother 87  . Healthy Father   . Heart Problems Maternal Grandfather     Social History   Tobacco Use  . Smoking status: Never Smoker  . Smokeless tobacco: Never Used  Substance Use Topics  . Alcohol use: Yes    Comment: occasional (2 mos ago)  . Drug use: Yes    Types: Marijuana    Comment: one month ago    Home Medications Prior to Admission medications   Medication Sig Start Date End Date Taking? Authorizing Provider  albuterol (PROVENTIL HFA;VENTOLIN HFA) 108 (90 BASE) MCG/ACT inhaler Inhale 2 puffs into the lungs every 6 (six) hours as needed for wheezing or shortness of breath.     [provider]  beclomethasone (QVAR) 80 MCG/ACT inhaler Inhale 2 puffs into the lungs 2 (two) times daily.    [provider]  cetirizine (ZYRTEC) 10 MG tablet Take 10 mg by mouth daily.  [provider]  doxepin (SINEQUAN) 25 MG capsule Take 25 mg by mouth.    Joline Salt, RN  famotidine (PEPCID) 20 MG tablet Take 1 tablet (20 mg total) by mouth 2 (two) times daily. 08/15/19   Rolland Porter, MD  fluticasone (FLONASE) 50 MCG/ACT nasal spray Place 1 spray into both nostrils daily.    [provider]  gabapentin (NEURONTIN) 100 MG capsule Take 1 capsule (100 mg total) by mouth 3 (three) times daily. 08/04/19   Domenic Moras, PA-C  ibuprofen (ADVIL) 600 MG tablet Take 1 tablet (600 mg total) by mouth every 6 (six) hours as needed. 09/20/19   Constant, Peggy, MD  ibuprofen (ADVIL) 800 MG tablet Take 1 tablet (800 mg total) by mouth every 8 (eight) hours as needed. 03/06/19   Sharion Balloon, NP  levonorgestrel (MIRENA) 20 MCG/24HR IUD 1 each by Intrauterine route once. Inserted 2017 per pt     [provider]  levothyroxine (SYNTHROID, LEVOTHROID) 25 MCG tablet Take 25 mcg by mouth daily before breakfast.    [provider]  lisdexamfetamine (VYVANSE) 50 MG capsule Take 50 mg by mouth daily.    [provider]  meloxicam (MOBIC) 15 MG tablet Take 1 tablet (15 mg total) by mouth daily. 10/16/19   Jaynee Eagles, PA-C  methocarbamol (ROBAXIN) 500 MG tablet Take 1 tablet (500 mg total) by mouth 2 (two) times daily. 10/18/19   Garald Balding, PA-C  naproxen (NAPROSYN) 500 MG tablet Take 0.5 tablets (250 mg total) by mouth 2 (two) times daily with a meal. 10/18/19   Sharyn Lull A, PA-C  ondansetron (ZOFRAN ODT) 4 MG disintegrating tablet Take 1 tablet (4 mg total) by mouth every 8 (eight) hours as needed for nausea or vomiting. 06/08/19   Loura Halt A, NP  predniSONE (DELTASONE) 20 MG tablet Take 3 po QD x 3d , then 2 po QD x 3d then 1 po QD x 3d 08/15/19   Rolland Porter, MD  sertraline (ZOLOFT) 50 MG tablet Take 50 mg by mouth daily.    Joline Salt, RN  tiZANidine (ZANAFLEX) 4 MG tablet Take 1 tablet (4 mg total) by mouth every 8 (eight) hours as needed for muscle spasms. 10/16/19   Jaynee Eagles, PA-C  ziprasidone (GEODON) 20 MG capsule Take 20 mg by mouth 2 (two) times daily with a meal.     Joline Salt, RN    Allergies    Adhesive [tape], Shellfish allergy, Latex, and Peanut-containing drug products  Review of Systems   Review of Systems  Constitutional: Negative for chills and fever.  HENT: Negative for ear pain and sore throat.   Eyes: Negative for pain and visual disturbance.  Respiratory: Negative for cough and shortness of breath.   Cardiovascular: Negative for chest pain and palpitations.  Gastrointestinal: Negative for abdominal pain and vomiting.  Genitourinary: Negative for dysuria and hematuria.  Musculoskeletal: Positive for arthralgias, back pain and neck pain.  Skin: Negative for color change and rash.  Neurological: Negative for seizures  and syncope.  All other systems reviewed and are negative.   Physical Exam Updated Vital Signs BP 121/84   Pulse 91   Temp 99.1 F (37.3 C) (Oral)   Resp 18   SpO2 99%   Physical Exam Vitals and nursing note reviewed.  Constitutional:      General: She is not in acute distress.    Appearance: Normal appearance. She is well-developed. She is not ill-appearing, toxic-appearing or  diaphoretic.  HENT:     Head: Normocephalic and atraumatic.     Mouth/Throat:     Mouth: Mucous membranes are moist.     Pharynx: Oropharynx is clear.  Eyes:     Conjunctiva/sclera: Conjunctivae normal.  Cardiovascular:     Rate and Rhythm: Normal rate and regular rhythm.     Heart sounds: No murmur.  Pulmonary:     Effort: Pulmonary effort is normal. No respiratory distress.     Breath sounds: Normal breath sounds.  Abdominal:     Palpations: Abdomen is soft.     Tenderness: There is no abdominal tenderness.  Musculoskeletal:        General: No tenderness, deformity or signs of injury. Normal range of motion.     Cervical back: Neck supple.     Right lower leg: No edema.     Left lower leg: No edema.  Skin:    General: Skin is warm and dry.     Capillary Refill: Capillary refill takes less than 2 seconds.     Findings: No erythema or rash.  Neurological:     General: No focal deficit present.     Mental Status: She is alert and oriented to person, place, and time.     Sensory: No sensory deficit.     Motor: No weakness.     Comments: 5/5 strength, sensations intact, full range of motion in upper and lower extremities.  No midline tenderness in C, T, L spine. No stepoffs, crepitus, fluctuance. No contractures   Psychiatric:        Mood and Affect: Mood normal.        Behavior: Behavior normal.     ED Results / Procedures / Treatments   Labs (all labs ordered are listed, but only abnormal results are displayed) Labs Reviewed  BASIC METABOLIC PANEL - Abnormal; Notable for the following  components:      Result Value   Sodium 134 (*)    Glucose, Bld 111 (*)    All other components within normal limits    EKG None  Radiology No results found.  Procedures Procedures (including critical care time)  Medications Ordered in ED Medications  ketorolac (TORADOL) injection 60 mg (60 mg Intramuscular Given 10/18/19 1607)    ED Course  I have reviewed the triage vital signs and the nursing notes.  Pertinent labs & imaging results that were available during my care of the patient were reviewed by me and considered in my medical decision making (see chart for details).    MDM Rules/Calculators/A&P                     24 year old with chronic muscle spasms, neck pain, back pain, body aches. On presentation, the patient is alert and oriented, nontoxic-appearing, with no evidence of muscle spasms.  She has no midline tenderness, no red flags for cauda equina, abscess.  No concern for cancer.  Patient has a longstanding history of of similar symptoms.  No change in intensity, symptoms are just more consistent.  BMP without significant electrolyte abnormalities that would explain her spasms.  I informed the patient that children are screened for sickle cell at birth.  We will try Robaxin and naproxen as the patient is not sure if she has tried these medications.  Patient was given a shot of Toradol in the ER.  After long discussion, will provide rheumatology referral as the patient states that her PCP is not willing to work-up her  symptoms any further.  I still encouraged her to follow-up with her PCP.  She could potentially benefit from a heme work-up to rule out thalassemia, etcbut I will defer this to the PCP.  Return precautions given.  Patient voices understanding and is agreeable to this plan.  At this stage in the ED course, the patient has been medically screened and is stable for discharge.  I discussed the case with Dr. Vanita Panda and he is agreeable to the above plan.  Final  Clinical Impression(s) / ED Diagnoses Final diagnoses:  Muscle spasm    Rx / DC Orders ED Discharge Orders         Ordered    naproxen (NAPROSYN) 500 MG tablet  2 times daily with meals     10/18/19 1600    methocarbamol (ROBAXIN) 500 MG tablet  2 times daily     10/18/19 1600           Lyndel Safe 10/18/19 1625    Carmin Muskrat, MD 10/19/19 1106

## 2019-10-18 NOTE — ED Notes (Signed)
An After Visit Summary was printed and given to the patient. Discharge instructions given and no further questions at this time.  

## 2019-10-18 NOTE — ED Triage Notes (Signed)
Patient here from home with complaints of muscle spasms all over. States that she is unable to sit or stand without pain. Muscle relaxants and OTC meds with no relief. Seen for same "multiple times".

## 2019-11-23 ENCOUNTER — Encounter (HOSPITAL_COMMUNITY): Payer: Self-pay

## 2019-11-23 ENCOUNTER — Other Ambulatory Visit: Payer: Self-pay

## 2019-11-23 ENCOUNTER — Ambulatory Visit (INDEPENDENT_AMBULATORY_CARE_PROVIDER_SITE_OTHER): Payer: Medicare Other

## 2019-11-23 ENCOUNTER — Ambulatory Visit (HOSPITAL_COMMUNITY)
Admission: RE | Admit: 2019-11-23 | Discharge: 2019-11-23 | Disposition: A | Discharge status: Home / Self Care | Payer: Medicare Other | Source: Ambulatory Visit | Attending: Urgent Care | Admitting: Urgent Care

## 2019-11-23 VITALS — BP 122/75 | HR 88 | Temp 98.4°F | Resp 18

## 2019-11-23 DIAGNOSIS — R079 Chest pain, unspecified: Secondary | ICD-10-CM

## 2019-11-23 DIAGNOSIS — M940 Chondrocostal junction syndrome [Tietze]: Secondary | ICD-10-CM

## 2019-11-23 DIAGNOSIS — R0789 Other chest pain: Secondary | ICD-10-CM

## 2019-11-23 MED ORDER — MELOXICAM 15 MG PO TABS
15.0000 mg | ORAL_TABLET | Freq: Every day | ORAL | 0 refills | Status: DC
Start: 2019-11-23 — End: 2019-12-06

## 2019-11-23 MED ORDER — METHOCARBAMOL 500 MG PO TABS
500.0000 mg | ORAL_TABLET | Freq: Three times a day (TID) | ORAL | 0 refills | Status: DC | PRN
Start: 2019-11-23 — End: 2019-12-07

## 2019-11-23 NOTE — ED Provider Notes (Signed)
[name]   MRN: [id number] DOB: 05-Jan-1996  Subjective:   Veronica Buchanan is a 24 y.o. female presenting for 1 month history of recurrent chronic chest pain across her entire chest but worse over the mid to left side.  Patient cannot recall any particular trauma, has not had bruising or swelling.  Patient states it hurts to take deep breath.  Has had multiple visits for this in the past and has undergone steroid course, NSAID use, muscle relaxants.  The medications that have worked the [place] for patient are meloxicam and Robaxin.  She has follow-up with her PCP and a few referrals pending.  No current facility-administered medications for this encounter.  Current Outpatient Medications:  .  albuterol (PROVENTIL HFA;VENTOLIN HFA) 108 (90 BASE) MCG/ACT inhaler, Inhale 2 puffs into the lungs every 6 (six) hours as needed for wheezing or shortness of breath. , Disp: , Rfl:  .  beclomethasone (QVAR) 80 MCG/ACT inhaler, Inhale 2 puffs into the lungs 2 (two) times daily., Disp: , Rfl:  .  cetirizine (ZYRTEC) 10 MG tablet, Take 10 mg by mouth daily., Disp: , Rfl:  .  doxepin (SINEQUAN) 25 MG capsule, Take 25 mg by mouth., Disp: , Rfl:  .  famotidine (PEPCID) 20 MG tablet, Take 1 tablet (20 mg total) by mouth 2 (two) times daily., Disp: 18 tablet, Rfl: 0 .  fluticasone (FLONASE) 50 MCG/ACT nasal spray, Place 1 spray into both nostrils daily., Disp: , Rfl:  .  gabapentin (NEURONTIN) 100 MG capsule, Take 1 capsule (100 mg total) by mouth 3 (three) times daily., Disp: 30 capsule, Rfl: 0 .  ibuprofen (ADVIL) 600 MG tablet, Take 1 tablet (600 mg total) by mouth every 6 (six) hours as needed., Disp: 30 tablet, Rfl: 1 .  ibuprofen (ADVIL) 800 MG tablet, Take 1 tablet (800 mg total) by mouth every 8 (eight) hours as needed., Disp: 21 tablet, Rfl: 0 .  levonorgestrel (MIRENA) 20 MCG/24HR IUD, 1 each by Intrauterine route once. Inserted 2017 per pt, Disp: , Rfl:  .  levothyroxine (SYNTHROID,  LEVOTHROID) 25 MCG tablet, Take 25 mcg by mouth daily before breakfast., Disp: , Rfl:  .  lisdexamfetamine (VYVANSE) 50 MG capsule, Take 50 mg by mouth daily., Disp: , Rfl:  .  meloxicam (MOBIC) 15 MG tablet, Take 1 tablet (15 mg total) by mouth daily., Disp: 30 tablet, Rfl: 0 .  methocarbamol (ROBAXIN) 500 MG tablet, Take 1 tablet (500 mg total) by mouth 2 (two) times daily., Disp: 20 tablet, Rfl: 0 .  naproxen (NAPROSYN) 500 MG tablet, Take 0.5 tablets (250 mg total) by mouth 2 (two) times daily with a meal., Disp: 39 tablet, Rfl: 0 .  ondansetron (ZOFRAN ODT) 4 MG disintegrating tablet, Take 1 tablet (4 mg total) by mouth every 8 (eight) hours as needed for nausea or vomiting., Disp: 20 tablet, Rfl: 0 .  predniSONE (DELTASONE) 20 MG tablet, Take 3 po QD x 3d , then 2 po QD x 3d then 1 po QD x 3d, Disp: 18 tablet, Rfl: 0 .  sertraline (ZOLOFT) 50 MG tablet, Take 50 mg by mouth daily., Disp: , Rfl:  .  tiZANidine (ZANAFLEX) 4 MG tablet, Take 1 tablet (4 mg total) by mouth every 8 (eight) hours as needed for muscle spasms., Disp: 30 tablet, Rfl: 0 .  ziprasidone (GEODON) 20 MG capsule, Take 20 mg by mouth 2 (two) times daily with a meal. , Disp: , Rfl:    Allergies  Allergen Reactions  .  Adhesive [Tape] Dermatitis  . Shellfish Allergy Swelling  . Latex Itching    burning  . Peanut-Containing Drug Products     Mouth swelling     Past Medical History:  Diagnosis Date  . Anxiety   . Bipolar disorder ([place])   . 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 Stage I.  . Osteochondroma   . Thyroid disease      Past Surgical History:  Procedure Laterality Date  . BIOPSY N/A 12/29/2018   Procedure: Peritoneal biopsies;  Surgeon: Osborne Oman, MD;  Location: [place];  Service: Gynecology;  Laterality: N/A;  . LAPAROSCOPY N/A 12/29/2018   Procedure: LAPAROSCOPY DIAGNOSTIC;  Surgeon: Osborne Oman, MD;  Location:  [place];  Service: Gynecology;  Laterality: N/A;    Family History  Problem Relation Age of Onset  . Asthma Other   . Heart Problems Mother   . Ovarian cancer Mother 44  . Healthy Father   . Heart Problems Maternal Grandfather     Social History   Tobacco Use  . Smoking status: Never Smoker  . Smokeless tobacco: Never Used  Vaping Use  . Vaping Use: Never used  Substance Use Topics  . Alcohol use: Yes    Comment: occasional (2 mos ago)  . Drug use: Yes    Types: Marijuana    Comment: one month ago    ROS   Objective:   Vitals: BP 122/75 (BP Location: Left Arm)   Pulse 88   Temp 98.4 F (36.9 C) (Oral)   Resp 18   LMP 11/23/2019   SpO2 100%   Physical Exam Constitutional:      General: She is not in acute distress.    Appearance: Normal appearance. She is well-developed. She is not ill-appearing, toxic-appearing or diaphoretic.  HENT:     Head: Normocephalic and atraumatic.     Nose: Nose normal.     Mouth/Throat:     Mouth: Mucous membranes are moist.  Eyes:     Extraocular Movements: Extraocular movements intact.     Pupils: Pupils are equal, round, and reactive to light.  Cardiovascular:     Rate and Rhythm: Normal rate and regular rhythm.     Pulses: Normal pulses.     Heart sounds: Normal heart sounds. No murmur heard.  No friction rub. No gallop.   Pulmonary:     Effort: Pulmonary effort is normal. No respiratory distress.     Breath sounds: Normal breath sounds. No stridor. No wheezing, rhonchi or rales.  Chest:     Chest wall: Tenderness (over areas outlined) present.    Skin:    General: Skin is warm and dry.     Findings: No rash.  Neurological:     Mental Status: She is alert and oriented to person, place, and time.  Psychiatric:        Mood and Affect: Mood normal.        Behavior: Behavior normal.        Thought Content: Thought content normal.     DG Chest 2 View  Result Date: 11/23/2019 CLINICAL DATA:   Chest pain.  Left-sided chest pain. EXAM: CHEST - 2 VIEW COMPARISON:  June 24, 2019 FINDINGS: The heart size and mediastinal contours are within normal limits. Both lungs are clear. The visualized skeletal structures are unremarkable. IMPRESSION: No active cardiopulmonary disease. Electronically Signed   By: Jamie Kato.D.  On: 11/23/2019 16:17     Assessment and Plan :   PDMP not reviewed this encounter.  1. Costochondritis   2. Atypical chest pain   3. Chest wall pain     Suspect musculoskeletal chest wall pain, costochondritis.  Patient does not perform strenuous physical labor, exercise.  Recommended rest, offered steroid course but she refused.  Refilled her meloxicam and Robaxin as this is worked [place] for her.  Follow-up with PCP ASAP. Counseled patient on potential for adverse effects with medications prescribed/recommended today, ER and return-to-clinic precautions discussed, patient verbalized understanding.    Jaynee Eagles, [place] 11/23/19 1623

## 2019-11-23 NOTE — ED Triage Notes (Signed)
Pt sts intermittent mid sternal CP x 1 month worse with movement and laughing; pt sts seeing PCP for workup of other sx including muscle spasms

## 2019-12-06 ENCOUNTER — Encounter: Payer: Self-pay | Admitting: *Deleted

## 2019-12-07 ENCOUNTER — Encounter: Payer: Self-pay | Admitting: Neurology

## 2019-12-07 ENCOUNTER — Ambulatory Visit (INDEPENDENT_AMBULATORY_CARE_PROVIDER_SITE_OTHER): Payer: Medicare Other | Admitting: Neurology

## 2019-12-07 VITALS — BP 110/66 | HR 90 | Ht 66.0 in | Wt 134.0 lb

## 2019-12-07 DIAGNOSIS — R52 Pain, unspecified: Secondary | ICD-10-CM

## 2019-12-07 DIAGNOSIS — R202 Paresthesia of skin: Secondary | ICD-10-CM

## 2019-12-07 HISTORY — DX: Paresthesia of skin: R20.2

## 2019-12-07 HISTORY — DX: Pain, unspecified: R52

## 2019-12-07 MED ORDER — DULOXETINE HCL 30 MG PO CPEP: 30.0000 mg | ORAL_CAPSULE | Freq: Every day | ORAL | 11 refills | Status: DC

## 2019-12-07 NOTE — Progress Notes (Signed)
HISTORICAL  Veronica Buchanan  seen in request by sports medicine Dr. Delilah Shan, Veronica Buchanan her PCP NP Veronica Buchanan for evaluation of right arm, shoulder pain, alone on December 07 2019.   She has bioplar disorder, on Geodon 20mg  bid for few years ADHD, vyvanse 50mg  daily Chronic migraine, topamax 25mg  bid and imitrex prn.  Since Feb 2021, without trigger, gradual onset of right collar bone area pain, then right neck, radiating pain to right shoulder blade.  Later pain spread to both shoulder, arm, legs, intermittent numbness, tingling.  She denies gait abnormalities, no visual loss.  She was given Flexeril, tramadol, NSAIDs, Robaxin, with out helping her symptoms  MRI of cervical spine from [facility] on August 19, 2019 showed no significant abnormality, mild degenerative changes, no evidence of foraminal or canal stenosis.  MRI of right shoulder from emergent also on August 19, 2019, thin longitudinal interstitial Spaete at today's supraspinatus infraspinatus tendon transition, otherwise no significant abnormality.  REVIEW OF SYSTEMS: Full 14 system review of systems performed and notable only for as above All other review of systems were negative.  ALLERGIES: Allergies  Allergen Reactions  . Adhesive [Tape] Dermatitis  . Shellfish Allergy Swelling  . Latex Itching    burning  . Peanut-Containing Drug Products     Mouth swelling     HOME MEDICATIONS: Current Outpatient Medications  Medication Sig Dispense Refill  . albuterol (PROVENTIL HFA;VENTOLIN HFA) 108 (90 BASE) MCG/ACT inhaler Inhale 2 puffs into the lungs every 6 (six) hours as needed for wheezing or shortness of breath.     . cyclobenzaprine (FLEXERIL) 5 MG tablet Take 5 mg by mouth 3 (three) times daily as needed for muscle spasms.    Marland Kitchen doxepin (SINEQUAN) 25 MG capsule Take 25 mg by mouth.    . fluticasone (FLONASE) 50 MCG/ACT nasal spray Place 1 spray into both nostrils daily.    Marland Kitchen ibuprofen (ADVIL) 600 MG tablet  Take 1 tablet (600 mg total) by mouth every 6 (six) hours as needed. 30 tablet 1  . lisdexamfetamine (VYVANSE) 50 MG capsule Take 50 mg by mouth daily.    . nitrofurantoin (MACRODANTIN) 100 MG capsule Take 100 mg by mouth 4 (four) times daily.    . ondansetron (ZOFRAN ODT) 4 MG disintegrating tablet Take 1 tablet (4 mg total) by mouth every 8 (eight) hours as needed for nausea or vomiting. 20 tablet 0  . pantoprazole (PROTONIX) 20 MG tablet Take 20 mg by mouth daily.    . predniSONE (STERAPRED UNI-PAK 21 TAB) 10 MG (21) TBPK tablet Take by mouth See Admin Instructions for 6 doses. Take as directed on dose pack for 6 days.    Marland Kitchen sertraline (ZOLOFT) 50 MG tablet Take 50 mg by mouth daily.    . SUMAtriptan (IMITREX) 50 MG tablet Take 50 mg by mouth every 2 (two) hours as needed for migraine. May repeat in 2 hours if headache persists or recurs.    . topiramate (TOPAMAX) 25 MG tablet Take 25 mg by mouth 2 (two) times daily.    . ziprasidone (GEODON) 20 MG capsule Take 20 mg by mouth 2 (two) times daily with a meal.      No current facility-administered medications for this visit.    PAST MEDICAL HISTORY: Past Medical History:  Diagnosis Date  . Anxiety   . Asthma   . Bipolar disorder ([place])   . 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 Stage I.  Marland Kitchen  Muscle spasm   . Osteochondroma   . Thyroid disease     PAST SURGICAL HISTORY: Past Surgical History:  Procedure Laterality Date  . BIOPSY N/A 12/29/2018   Procedure: Peritoneal biopsies;  Surgeon: Osborne Oman, MD;  Location: [place];  Service: Gynecology;  Laterality: N/A;  . LAPAROSCOPY N/A 12/29/2018   Procedure: LAPAROSCOPY DIAGNOSTIC;  Surgeon: Osborne Oman, MD;  Location: [place];  Service: Gynecology;  Laterality: N/A;    FAMILY HISTORY: Family History  Problem Relation Age of Onset  . Asthma Other   . Heart Problems Mother   .  Ovarian cancer Mother 44  . Fibromyalgia Mother   . Asthma Father   . Heart Problems Maternal Grandfather     SOCIAL HISTORY: Social History   Socioeconomic History  . Marital status: Significant Other    Spouse name: Not on file  . Number of children: 0  . Years of education: two years of college  . Highest education level: Not on file  Occupational History  . Occupation: Product manager   Tobacco Use  . Smoking status: Never Smoker  . Smokeless tobacco: Never Used  Vaping Use  . Vaping Use: Never used  Substance and Sexual Activity  . Alcohol use: Yes    Comment: occasional   . Drug use: Yes    Types: Marijuana    Comment: 2-3 times per week  . Sexual activity: Yes    Birth control/protection: I.U.D.  Other Topics Concern  . Not on file  Social History Narrative   Lives at home with fiance.   Right-handed.   One cup soda daily.   Social Determinants of Health   Financial Resource Strain:   . Difficulty of Paying Living Expenses:   Food Insecurity:   . Worried About Charity fundraiser in the Last Year:   . Arboriculturist in the Last Year:   Transportation Needs:   . Film/video editor (Medical):   Marland Kitchen Lack of Transportation (Non-Medical):   Physical Activity:   . Days of Exercise per Week:   . Minutes of Exercise per Session:   Stress:   . Feeling of Stress :   Social Connections:   . Frequency of Communication with Friends and Family:   . Frequency of Social Gatherings with Friends and Family:   . Attends Religious Services:   . Active Member of Clubs or Organizations:   . Attends Archivist Meetings:   Marland Kitchen Marital Status:   Intimate Partner Violence:   . Fear of Current or Ex-Partner:   . Emotionally Abused:   Marland Kitchen Physically Abused:   . Sexually Abused:      PHYSICAL EXAM   Vitals:   12/07/19 1041  BP: 110/66  Pulse: 90  Weight: 134 lb (60.8 kg)  Height: 5\' 6"  (1.676 m)   Not recorded     Body mass index is 21.63 kg/m.  PHYSICAL  EXAMNIATION:  Gen: NAD, conversant, well nourised, well groomed                     Cardiovascular: Regular rate rhythm, no peripheral edema, warm, nontender. Eyes: Conjunctivae clear without exudates or hemorrhage Neck: Supple, no carotid bruits. Pulmonary: Clear to auscultation bilaterally  Musculoskeletal: bilateral shoulder and proximal arm, thigh pain upon deep palpitation  NEUROLOGICAL EXAM:  MENTAL STATUS: Speech:    Speech is normal; fluent and spontaneous with normal comprehension.  Cognition:  Orientation to time, place and person     Normal recent and remote memory     Normal Attention span and concentration     Normal Language, naming, repeating,spontaneous speech     Fund of knowledge   CRANIAL NERVES: CN II: Visual fields are full to confrontation. Pupils are round equal and briskly reactive to light. CN III, IV, VI: extraocular movement are normal. No ptosis. CN V: Facial sensation is intact to light touch CN VII: Face is symmetric with normal eye closure  CN VIII: Hearing is normal to causal conversation. CN IX, X: Phonation is normal. CN XI: Head turning and shoulder shrug are intact  MOTOR: There is no pronator drift of out-stretched arms. Muscle bulk and tone are normal. Muscle strength is normal.  REFLEXES: Reflexes are 2+ and symmetric at the biceps, triceps, knees, and ankles. Plantar responses are flexor.  SENSORY: Intact to light touch, pinprick and vibratory sensation are intact in fingers and toes.  COORDINATION: There is no trunk or limb dysmetria noted.  GAIT/STANCE: Posture is normal. Gait is steady with normal steps, base, arm swing, and turning. Heel and toe walking are normal. Tandem gait is normal.  Romberg is absent.   DIAGNOSTIC DATA (LABS, IMAGING, TESTING) - I reviewed patient records, labs, notes, testing and imaging myself where available.   ASSESSMENT AND PLAN  Fantasia Dalesha Stanback is a 24 y.o. female   Diffuse body achy  pain, intermittent paresthesia,  in the setting of mood disorder.  Lab evaluation to rule out inflammatory process, thyroid malfunction  EMG/NCS  Cymbalta 30mg     Marcial Pacas, M.D. Ph.D.  [facility] [address] [place], [place] [address] Ph: [phone] Fax: [phone]  CC:  Pedro Earls, [address] [place],  [place] [address]  Berkley Harvey, NP

## 2019-12-08 LAB — CBC WITH DIFFERENTIAL
Basophils Absolute: 0 10*3/uL (ref 0.0–0.2)
Basos: 1 %
EOS (ABSOLUTE): 0.1 10*3/uL (ref 0.0–0.4)
Eos: 2 %
Hematocrit: 42.4 % (ref 34.0–46.6)
Hemoglobin: 12.9 g/dL (ref 11.1–15.9)
Immature Grans (Abs): 0 10*3/uL (ref 0.0–0.1)
Immature Granulocytes: 0 %
Lymphocytes Absolute: 2.4 10*3/uL (ref 0.7–3.1)
Lymphs: 44 %
MCH: 23 pg — ABNORMAL LOW (ref 26.6–33.0)
MCHC: 30.4 g/dL — ABNORMAL LOW (ref 31.5–35.7)
MCV: 76 fL — ABNORMAL LOW (ref 79–97)
Monocytes Absolute: 0.5 10*3/uL (ref 0.1–0.9)
Monocytes: 9 %
Neutrophils Absolute: 2.4 10*3/uL (ref 1.4–7.0)
Neutrophils: 44 %
RBC: 5.61 x10E6/uL — ABNORMAL HIGH (ref 3.77–5.28)
RDW: 13.5 % (ref 11.7–15.4)
WBC: 5.5 10*3/uL (ref 3.4–10.8)

## 2019-12-08 LAB — COMPREHENSIVE METABOLIC PANEL
ALT: 7 IU/L (ref 0–32)
AST: 10 IU/L (ref 0–40)
Albumin/Globulin Ratio: 1.7 (ref 1.2–2.2)
Albumin: 4.7 g/dL (ref 3.9–5.0)
Alkaline Phosphatase: 79 IU/L (ref 48–121)
BUN/Creatinine Ratio: 9 (ref 9–23)
BUN: 6 mg/dL (ref 6–20)
Bilirubin Total: 0.2 mg/dL (ref 0.0–1.2)
CO2: 23 mmol/L (ref 20–29)
Calcium: 9.6 mg/dL (ref 8.7–10.2)
Chloride: 104 mmol/L (ref 96–106)
Creatinine, Ser: 0.64 mg/dL (ref 0.57–1.00)
GFR calc Af Amer: 145 mL/min/{1.73_m2} (ref >59)
GFR calc non Af Amer: 126 mL/min/{1.73_m2} (ref >59)
Globulin, Total: 2.8 g/dL (ref 1.5–4.5)
Glucose: 90 mg/dL (ref 65–99)
Potassium: 4.5 mmol/L (ref 3.5–5.2)
Sodium: 138 mmol/L (ref 134–144)
Total Protein: 7.5 g/dL (ref 6.0–8.5)

## 2019-12-08 LAB — CK: Total CK: 73 U/L (ref 32–182)

## 2019-12-08 LAB — SEDIMENTATION RATE: Sed Rate: 4 mm/hr (ref 0–32)

## 2019-12-08 LAB — TSH: TSH: 2.3 u[IU]/mL (ref 0.450–4.500)

## 2019-12-08 LAB — FERRITIN: Ferritin: 104 ng/mL (ref 15–150)

## 2019-12-08 LAB — ANA W/REFLEX IF POSITIVE: Anti Nuclear Antibody (ANA): NEGATIVE

## 2019-12-08 LAB — HIV ANTIBODY (ROUTINE TESTING W REFLEX): HIV Screen 4th Generation wRfx: NONREACTIVE

## 2019-12-08 LAB — RPR: RPR Ser Ql: NONREACTIVE

## 2019-12-08 LAB — VITAMIN B12: Vitamin B-12: 647 pg/mL (ref 232–1245)

## 2019-12-08 LAB — C-REACTIVE PROTEIN: CRP: <1 mg/L (ref 0–10)

## 2019-12-08 LAB — IRON AND TIBC
Iron Saturation: 23 % (ref 15–55)
Iron: 71 ug/dL (ref 27–159)
Total Iron Binding Capacity: 313 ug/dL (ref 250–450)
UIBC: 242 ug/dL (ref 131–425)

## 2019-12-28 ENCOUNTER — Encounter: Payer: Self-pay | Admitting: Neurology

## 2020-01-18 ENCOUNTER — Encounter: Payer: Medicare Other | Admitting: Neurology

## 2020-02-02 NOTE — Progress Notes (Signed)
Office Visit Note  Patient: Veronica Buchanan             Date of Birth: 05-03-1996           MRN: [id number]             PCP: Berkley Harvey, NP Referring: Berkley Harvey, NP Visit Date: 02/16/2020 Occupation: @GUAROCC @  Subjective:  Spasms (various places) and Back Pain (sharp pains)   History of Present Illness: Veronica Buchanan is a 24 y.o. female seen in consultation per request of her PCP.  According the patient she has had history of back pain in the past.  She recalls having x-rays in the past.  In February 2021 she started having right-sided shoulder pain which gradually moved to other areas.  At that time she was referred to an orthopedic surgeon who did x-rays and recommended physical therapy.  She states before physical therapy she was having difficulty raising her right arm now she has full range of motion in her right arm.  Although the muscle spasms in her neck got worse.  She states she has been having spasms in her rib cage and upper back.  She has tried muscle relaxers and different medications from urgent care and in the hospital visits without much help.  She continues to have a lot of neck pain.  She has discomfort looking down.  She also describes pain in the thoracic spine.  She has been seeing a neurologist at [facility] for migraines and he is doing further work-up including x-rays of her cervical and thoracic spine which is pending at this time.  She states she has pain in her joints as well which moves around.  She has pain in her knees and sometimes in her ankles.  There is no history of joint swelling.  Her first cousin has lupus and her uncle has MS.  He is gravida 0.  Activities of Daily Living:  Patient reports morning stiffness for 0 minutes.   Patient Reports nocturnal pain.  Difficulty dressing/grooming: Reports Difficulty climbing stairs: Reports Difficulty getting out of chair: Reports Difficulty using hands for taps, buttons, cutlery, and/or writing:  Reports  Review of Systems  Constitutional: Positive for fatigue.  HENT: Positive for mouth sores and mouth dryness. Negative for nose dryness.   Eyes: Positive for photophobia and itching. Negative for pain, visual disturbance and dryness.  Respiratory: Positive for difficulty breathing. Negative for cough and shortness of breath.   Cardiovascular: Positive for chest pain. Negative for palpitations and swelling in legs/feet.  Gastrointestinal: Positive for constipation. Negative for blood in stool and diarrhea.  Endocrine: Positive for increased urination.  Genitourinary: Negative for difficulty urinating and painful urination.  Musculoskeletal: Positive for arthralgias, joint pain, myalgias, muscle weakness, muscle tenderness and myalgias. Negative for joint swelling and morning stiffness.  Skin: Positive for sensitivity to sunlight. Negative for color change, rash, hair loss and redness.  Allergic/Immunologic: Negative for susceptible to infections.  Neurological: Positive for dizziness, numbness, memory loss and weakness. Negative for headaches.  Hematological: Positive for bruising/bleeding tendency.  Psychiatric/Behavioral: Positive for sleep disturbance. Negative for depressed mood and confusion. The patient is nervous/anxious.     PMFS History:  Patient Active Problem List   Diagnosis Date Noted  . History of asthma 02/16/2020  . History of migraine 02/16/2020  . History of bipolar disorder 02/16/2020  . Paresthesia 12/07/2019  . Whole body pain 12/07/2019  . Chronic female pelvic pain 12/29/2018  . Endometriosis of  pelvis 12/29/2018    Past Medical History:  Diagnosis Date  . Anxiety   . Asthma   . Bipolar disorder ([place])   . 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 Stage I.  . Muscle spasm   . Osteochondroma   . Thyroid disease     Family History  Problem Relation Age of Onset  . Asthma Other   . Heart  Problems Mother   . Ovarian cancer Mother 69  . Fibromyalgia Mother   . Asthma Father   . Heart Problems Maternal Grandfather   . Autism Sister   . Multiple sclerosis Paternal Uncle   . Lupus Cousin   . Autism Sister    Past Surgical History:  Procedure Laterality Date  . BIOPSY N/A 12/29/2018   Procedure: Peritoneal biopsies;  Surgeon: Osborne Oman, MD;  Location: [place];  Service: Gynecology;  Laterality: N/A;  . LAPAROSCOPY N/A 12/29/2018   Procedure: LAPAROSCOPY DIAGNOSTIC;  Surgeon: Osborne Oman, MD;  Location: [place];  Service: Gynecology;  Laterality: N/A;   Social History   Social History Narrative   Lives at home with fiance.   Right-handed.   One cup soda daily.   Immunization History  Administered Date(s) Administered  . HPV 9-valent 12/22/2018, 03/15/2019     Objective: Vital Signs: BP 116/75 (BP Location: Right Arm, Patient Position: Sitting, Cuff Size: Small)   Pulse 62   Resp 14   Ht 5' 6.25" (1.683 m)   Wt 128 lb 6.4 oz (58.2 kg)   LMP 02/10/2020 (Exact Date)   BMI 20.57 kg/m    Physical Exam Vitals and nursing note reviewed.  Constitutional:      Appearance: She is well-developed.  HENT:     Head: Normocephalic and atraumatic.  Eyes:     Conjunctiva/sclera: Conjunctivae normal.  Cardiovascular:     Rate and Rhythm: Normal rate and regular rhythm.     Heart sounds: Normal heart sounds.  Pulmonary:     Effort: Pulmonary effort is normal.     Breath sounds: Normal breath sounds.  Abdominal:     General: Bowel sounds are normal.     Palpations: Abdomen is soft.  Musculoskeletal:     Cervical back: Normal range of motion.  Lymphadenopathy:     Cervical: No cervical adenopathy.  Skin:    General: Skin is warm and dry.     Capillary Refill: Capillary refill takes less than 2 seconds.  Neurological:     Mental Status: She is alert and oriented to person, place, and time.  Psychiatric:         Behavior: Behavior normal.      Musculoskeletal Exam: She has a stiffness with range of motion of her cervical spine.  Thoracic and lumbar spine were in good range of motion.  No SI joint tenderness was noted.  Shoulder joints, elbow joints, wrist joints, MCPs PIPs and DIPs with good range of motion with no synovitis.  Hip joints, knee joints, ankles, MTPs and PIPs with good range of motion with no synovitis.  She has some trapezius spasm and tenderness.  CDAI Exam: CDAI Score: -- Patient Global: --; Provider Global: -- Swollen: --; Tender: -- Joint Exam 02/16/2020   No joint exam has been documented for this visit   There is currently no information documented on the homunculus. Go to the Rheumatology activity and complete the homunculus joint exam.  Investigation: No additional findings.  Imaging: No results found.  Recent Labs: Lab Results  Component Value Date   WBC 4.9 02/09/2020   HGB 12.1 02/09/2020   PLT 319 02/09/2020   NA 140 02/09/2020   K 3.8 02/09/2020   CL 108 02/09/2020   CO2 25 02/09/2020   GLUCOSE 81 02/09/2020   BUN 6 02/09/2020   CREATININE 0.74 02/09/2020   BILITOT 0.2 12/07/2019   ALKPHOS 79 12/07/2019   AST 10 12/07/2019   ALT 7 12/07/2019   PROT 7.5 12/07/2019   ALBUMIN 4.7 12/07/2019   CALCIUM 9.5 02/09/2020   GFRAA >60 02/09/2020  April 07, 2017 x-ray of the lumbar spine was unremarkable.,  X-ray of the thoracic spine was unremarkable. March 06, 2019 x-ray of the left foot unremarkable.  November 23, 2019 chest x-ray was normal.  No skeletal structural abnormalities were detected.  Speciality Comments: No specialty comments available.  Procedures:  No procedures performed Allergies: Adhesive [tape], Shellfish allergy, Latex, and Peanut-containing drug products   Assessment / Plan:     Visit Diagnoses: Body aches -patient complains of pain in her neck, upper back, generalized pain.  She had no synovitis on examination.  All her joints were  in good range of motion.  There is positive family history of lupus in her first cousin.  All the autoimmune work-up was negative.  12/07/19: ANA negative, ESR 4, CRP<1, CK 73, Vitamin B12 647, HIV-, RPR-, TSH 2.30  Chronic neck pain-she has stiffness with range of motion of her cervical spine.  She trapezius spasm.  She is seeing her neurologist and getting x-ray of her cervical spine.  Dorsalgia-she complains of thoracic pain.  She states thoracic spine x-rays will be done by her neurologist next week.  I reviewed previous x-rays of her thoracic and lumbar spine which were unremarkable.  Myofascial pain-she appears to have some myofascial pain.  Her CK is normal.  She will benefit from physical therapy.  She had initial response from physical therapy.  I am my opinion she should continue with the physical therapy.  She also benefit from stretching exercises and water aerobics.  Other insomnia-she states she does not sleep very well.  Good sleep hygiene was discussed.  She believes the pain also keeps her up at night.  She has tried several medications in the past none of those were effective.  History of bipolar disorder-she is currently on medications.  History of migraine-she is followed by her neurologist.  History of asthma-she takes albuterol on as needed basis.  Endometriosis of pelvis-she gives history of pelvic pain.   Orders: No orders of the defined types were placed in this encounter.  No orders of the defined types were placed in this encounter.    Follow-Up Instructions: Return for MFP.  I detailed discussion with the patient.  If the neurologist finds the solution for her then there would be no need for her to come back for follow-up visit.  If she wants to return for follow-up visit then she can keep the follow-up appointment otherwise she can cancel appointment.   Bo Merino, MD  Note - This record has been created using Editor, commissioning.  Chart creation errors  have been sought, but may not always  have been located. Such creation errors do not reflect on  the standard of medical care.

## 2020-02-09 ENCOUNTER — Other Ambulatory Visit: Payer: Self-pay

## 2020-02-09 ENCOUNTER — Ambulatory Visit (HOSPITAL_COMMUNITY)
Admission: EM | Admit: 2020-02-09 | Discharge: 2020-02-09 | Disposition: A | Discharge status: Home / Self Care | Payer: Medicare Other | Attending: Family Medicine | Admitting: Family Medicine

## 2020-02-09 ENCOUNTER — Encounter (HOSPITAL_COMMUNITY): Payer: Self-pay | Admitting: Emergency Medicine

## 2020-02-09 DIAGNOSIS — Z3202 Encounter for pregnancy test, result negative: Secondary | ICD-10-CM

## 2020-02-09 DIAGNOSIS — R110 Nausea: Secondary | ICD-10-CM | POA: Diagnosis present

## 2020-02-09 DIAGNOSIS — R55 Syncope and collapse: Secondary | ICD-10-CM | POA: Insufficient documentation

## 2020-02-09 LAB — CBC WITH DIFFERENTIAL/PLATELET
Abs Immature Granulocytes: 0.01 10*3/uL (ref 0.00–0.07)
Basophils Absolute: 0 10*3/uL (ref 0.0–0.1)
Basophils Relative: 1 %
Eosinophils Absolute: 0.1 10*3/uL (ref 0.0–0.5)
Eosinophils Relative: 3 %
HCT: 40.3 % (ref 36.0–46.0)
Hemoglobin: 12.1 g/dL (ref 12.0–15.0)
Immature Granulocytes: 0 %
Lymphocytes Relative: 44 %
Lymphs Abs: 2.2 10*3/uL (ref 0.7–4.0)
MCH: 22.3 pg — ABNORMAL LOW (ref 26.0–34.0)
MCHC: 30 g/dL (ref 30.0–36.0)
MCV: 74.4 fL — ABNORMAL LOW (ref 80.0–100.0)
Monocytes Absolute: 0.6 10*3/uL (ref 0.1–1.0)
Monocytes Relative: 13 %
Neutro Abs: 1.9 10*3/uL (ref 1.7–7.7)
Neutrophils Relative %: 39 %
Platelets: 319 10*3/uL (ref 150–400)
RBC: 5.42 MIL/uL — ABNORMAL HIGH (ref 3.87–5.11)
RDW: 13.8 % (ref 11.5–15.5)
WBC: 4.9 10*3/uL (ref 4.0–10.5)
nRBC: 0 % (ref 0.0–0.2)

## 2020-02-09 LAB — BASIC METABOLIC PANEL
Anion gap: 7 (ref 5–15)
BUN: 6 mg/dL (ref 6–20)
CO2: 25 mmol/L (ref 22–32)
Calcium: 9.5 mg/dL (ref 8.9–10.3)
Chloride: 108 mmol/L (ref 98–111)
Creatinine, Ser: 0.74 mg/dL (ref 0.44–1.00)
GFR calc Af Amer: >60 mL/min (ref >60)
GFR calc non Af Amer: >60 mL/min (ref >60)
Glucose, Bld: 81 mg/dL (ref 70–99)
Potassium: 3.8 mmol/L (ref 3.5–5.1)
Sodium: 140 mmol/L (ref 135–145)

## 2020-02-09 LAB — POCT URINALYSIS DIPSTICK, ED / UC
Bilirubin Urine: NEGATIVE
Glucose, UA: NEGATIVE mg/dL
Hgb urine dipstick: NEGATIVE
Ketones, ur: NEGATIVE mg/dL
Leukocytes,Ua: NEGATIVE
Nitrite: NEGATIVE
Protein, ur: NEGATIVE mg/dL
Specific Gravity, Urine: 1.025 (ref 1.005–1.030)
Urobilinogen, UA: 0.2 mg/dL (ref 0.0–1.0)
pH: 6 (ref 5.0–8.0)

## 2020-02-09 LAB — POC URINE PREG, ED: Preg Test, Ur: NEGATIVE

## 2020-02-09 MED ORDER — ONDANSETRON 4 MG PO TBDP
4.0000 mg | ORAL_TABLET | Freq: Three times a day (TID) | ORAL | 0 refills | Status: DC | PRN
Start: 2020-02-09 — End: 2020-10-02

## 2020-02-09 NOTE — ED Triage Notes (Signed)
Pt c/o nausea x 2 days. She states yesterday she passed out. She states she felt dizzy before. Pt denies vomiting. She is eating during triage.

## 2020-02-09 NOTE — ED Provider Notes (Signed)
[facility]    CSN: [id number] Arrival date & time: 02/09/20  1340      History   Chief Complaint Chief Complaint  Patient presents with  . Nausea  . Loss of Consciousness    HPI Veronica Buchanan is a 24 y.o. female.   Nausea ongoing for months that worsened yesterday, with an episode of passing out briefly yesterday afternoon. States typically her nausea is intermittent but yesterday it became significant and constant, and came with dizziness prior to syncopal episode in her kitchen. She did not hit her head in the fall and is having no subsequent pain or issues. Today has felt more back to her baseline and has had no further syncope. Has not been eating much, only tolerating chips and cereal. Not taking anything for sxs. Has used zofran in the past with temporary relief but ran out months ago. Of note, undergoing a Neurologic workup currently for some vague sxs that have been ongoing and has a nerve conduction studies coming up.      Past Medical History:  Diagnosis Date  . Anxiety   . Asthma   . Bipolar disorder ([place])   . 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 Stage I.  . Muscle spasm   . Osteochondroma   . Thyroid disease     Patient Active Problem List   Diagnosis Date Noted  . Paresthesia 12/07/2019  . Whole body pain 12/07/2019  . Chronic female pelvic pain 12/29/2018  . Endometriosis of pelvis 12/29/2018    Past Surgical History:  Procedure Laterality Date  . BIOPSY N/A 12/29/2018   Procedure: Peritoneal biopsies;  Surgeon: Osborne Oman, MD;  Location: [place];  Service: Gynecology;  Laterality: N/A;  . LAPAROSCOPY N/A 12/29/2018   Procedure: LAPAROSCOPY DIAGNOSTIC;  Surgeon: Osborne Oman, MD;  Location: [place];  Service: Gynecology;  Laterality: N/A;    OB History    Gravida  0   Para  0   Term  0   Preterm  0   AB  0   Living   0     SAB  0   TAB  0   Ectopic  0   Multiple  0   Live Births  0            Home Medications    Prior to Admission medications   Medication Sig Start Date End Date Taking? Authorizing Provider  albuterol (PROVENTIL HFA;VENTOLIN HFA) 108 (90 BASE) MCG/ACT inhaler Inhale 2 puffs into the lungs every 6 (six) hours as needed for wheezing or shortness of breath.    Yes [provider]  DULoxetine (CYMBALTA) 30 MG capsule Take 1 capsule (30 mg total) by mouth daily. 12/07/19  Yes Marcial Pacas, MD  zonisamide (ZONEGRAN) 50 MG capsule Take 50 mg by mouth daily.   Yes [provider]  cyclobenzaprine (FLEXERIL) 5 MG tablet Take 5 mg by mouth 3 (three) times daily as needed for muscle spasms.    [provider]  doxepin (SINEQUAN) 25 MG capsule Take 25 mg by mouth.    Joline Salt, RN  fluticasone [facility]) 50 MCG/ACT nasal spray Place 1 spray into both nostrils daily.    [provider]  ibuprofen (ADVIL) 600 MG tablet Take 1 tablet (600 mg total) by mouth every 6 (six) hours as needed. 09/20/19   Constant, Peggy, MD  lisdexamfetamine (VYVANSE) 50 MG capsule  Take 50 mg by mouth daily.    [provider]  nitrofurantoin (MACRODANTIN) 100 MG capsule Take 100 mg by mouth 4 (four) times daily.    [provider]  ondansetron (ZOFRAN ODT) 4 MG disintegrating tablet Take 1 tablet (4 mg total) by mouth every 8 (eight) hours as needed for nausea or vomiting. 02/09/20   Volney American, PA-C  pantoprazole (PROTONIX) 20 MG tablet Take 20 mg by mouth daily.    [provider]  SUMAtriptan (IMITREX) 50 MG tablet Take 50 mg by mouth every 2 (two) hours as needed for migraine. May repeat in 2 hours if headache persists or recurs.    [provider]  topiramate (TOPAMAX) 25 MG tablet Take 25 mg by mouth 2 (two) times daily.    [provider]  ziprasidone (GEODON) 20 MG capsule Take 20 mg by mouth 2 (two) times daily  with a meal.     Joline Salt, RN    Family History Family History  Problem Relation Age of Onset  . Asthma Other   . Heart Problems Mother   . Ovarian cancer Mother 76  . Fibromyalgia Mother   . Asthma Father   . Heart Problems Maternal Grandfather     Social History Social History   Tobacco Use  . Smoking status: Never Smoker  . Smokeless tobacco: Never Used  Vaping Use  . Vaping Use: Never used  Substance Use Topics  . Alcohol use: Yes    Comment: occasional   . Drug use: Yes    Types: Marijuana    Comment: 2-3 times per week     Allergies   Adhesive [tape], Shellfish allergy, Latex, and Peanut-containing drug products   Review of Systems Review of Systems PER HPI    Physical Exam Triage Vital Signs ED Triage Vitals  Enc Vitals Group     BP 02/09/20 1430 120/77     Pulse Rate 02/09/20 1430 93     Resp 02/09/20 1430 14     Temp 02/09/20 1430 98.9 F (37.2 C)     Temp Source 02/09/20 1430 Oral     SpO2 02/09/20 1430 100 %     Weight --      Height --      Head Circumference --      Peak Flow --      Pain Score 02/09/20 1427 6     Pain Loc --      Pain Edu? --      Excl. in [place]? --    No data found.  Updated Vital Signs BP 120/77 (BP Location: Right Arm)   Pulse 93   Temp 98.9 F (37.2 C) (Oral)   Resp 14   LMP 01/13/2020   SpO2 100%   Visual Acuity Right Eye Distance:   Left Eye Distance:   Bilateral Distance:    Right Eye Near:   Left Eye Near:    Bilateral Near:     Physical Exam Vitals and nursing note reviewed.  Constitutional:      Appearance: Normal appearance. She is not ill-appearing.  HENT:     Head: Atraumatic.     Right Ear: Tympanic membrane normal.     Left Ear: Tympanic membrane normal.     Nose: Nose normal.     Mouth/Throat:     Mouth: Mucous membranes are moist.     Pharynx: No posterior oropharyngeal erythema.  Eyes:     Extraocular Movements: Extraocular movements  intact.     Conjunctiva/sclera:  Conjunctivae normal.  Cardiovascular:     Rate and Rhythm: Normal rate and regular rhythm.     Heart sounds: Normal heart sounds.  Pulmonary:     Effort: Pulmonary effort is normal.     Breath sounds: Normal breath sounds.  Abdominal:     General: Bowel sounds are normal. There is no distension.     Palpations: Abdomen is soft.     Tenderness: There is no abdominal tenderness. There is no right CVA tenderness, left CVA tenderness or guarding.  Musculoskeletal:        General: Normal range of motion.     Cervical back: Normal range of motion and neck supple.  Skin:    General: Skin is warm and dry.  Neurological:     Mental Status: She is alert and oriented to person, place, and time.  Psychiatric:        Mood and Affect: Mood normal.        Thought Content: Thought content normal.        Judgment: Judgment normal.    UC Treatments / Results  Labs (all labs ordered are listed, but only abnormal results are displayed) Labs Reviewed  CBC WITH DIFFERENTIAL/PLATELET  BASIC METABOLIC PANEL  POCT URINALYSIS DIPSTICK, ED / UC  POC URINE PREG, ED    EKG   Radiology No results found.  Procedures Procedures (including critical care time)  Medications Ordered in UC Medications - No data to display  Initial Impression / Assessment and Plan / UC Course  I have reviewed the triage vital signs and the nursing notes.  Pertinent labs & imaging results that were available during my care of the patient were reviewed by me and considered in my medical decision making (see chart for details).     Chronic nausea and one episode of syncope yesterday - possibly from hypoglycemia or dehydration. Labs and vitals today stable. Will run U/A and labs, rx sent for zofran, and continue following with Neurology and PCP for ongoing sxs. Return if continuing to have dizziness or syncope.   Final Clinical Impressions(s) / UC Diagnoses   Final diagnoses:  Nausea  Syncope, unspecified syncope  type   Discharge Instructions   None    ED Prescriptions    Medication Sig Dispense Auth. Provider   ondansetron (ZOFRAN ODT) 4 MG disintegrating tablet Take 1 tablet (4 mg total) by mouth every 8 (eight) hours as needed for nausea or vomiting. 60 tablet Volney American, [place]     PDMP not reviewed this encounter.   Volney American, [place] 02/09/20 1544

## 2020-02-13 ENCOUNTER — Encounter: Payer: Medicare Other | Admitting: Neurology

## 2020-02-13 ENCOUNTER — Encounter: Payer: Self-pay | Admitting: Neurology

## 2020-02-16 ENCOUNTER — Other Ambulatory Visit: Payer: Self-pay

## 2020-02-16 ENCOUNTER — Ambulatory Visit (INDEPENDENT_AMBULATORY_CARE_PROVIDER_SITE_OTHER): Payer: Medicare Other | Admitting: Rheumatology

## 2020-02-16 ENCOUNTER — Encounter: Payer: Self-pay | Admitting: Rheumatology

## 2020-02-16 VITALS — BP 116/75 | HR 62 | Resp 14 | Ht 66.25 in | Wt 128.4 lb

## 2020-02-16 DIAGNOSIS — G4709 Other insomnia: Secondary | ICD-10-CM

## 2020-02-16 DIAGNOSIS — N803 Endometriosis of pelvic peritoneum: Secondary | ICD-10-CM

## 2020-02-16 DIAGNOSIS — M7918 Myalgia, other site: Secondary | ICD-10-CM

## 2020-02-16 DIAGNOSIS — M549 Dorsalgia, unspecified: Secondary | ICD-10-CM | POA: Diagnosis not present

## 2020-02-16 DIAGNOSIS — M542 Cervicalgia: Secondary | ICD-10-CM | POA: Diagnosis not present

## 2020-02-16 DIAGNOSIS — Z8659 Personal history of other mental and behavioral disorders: Secondary | ICD-10-CM

## 2020-02-16 DIAGNOSIS — G8929 Other chronic pain: Secondary | ICD-10-CM

## 2020-02-16 DIAGNOSIS — R52 Pain, unspecified: Secondary | ICD-10-CM

## 2020-02-16 DIAGNOSIS — Z8709 Personal history of other diseases of the respiratory system: Secondary | ICD-10-CM

## 2020-02-16 DIAGNOSIS — IMO0002 Reserved for concepts with insufficient information to code with codable children: Secondary | ICD-10-CM

## 2020-02-16 DIAGNOSIS — Z8669 Personal history of other diseases of the nervous system and sense organs: Secondary | ICD-10-CM

## 2020-02-25 NOTE — Progress Notes (Deleted)
Office Visit Note  Patient: Veronica Buchanan             Date of Birth: June 19, 1995           MRN: [id number]             PCP: Berkley Harvey, NP Referring: Berkley Harvey, NP Visit Date: 03/08/2020 Occupation: @GUAROCC @  Subjective:  No chief complaint on file.   History of Present Illness: Veronica Buchanan is a 24 y.o. female ***   Activities of Daily Living:  Patient reports morning stiffness for *** {minute/hour:19697}.   Patient {ACTIONS;DENIES/REPORTS:21021675::"Denies"} nocturnal pain.  Difficulty dressing/grooming: {ACTIONS;DENIES/REPORTS:21021675::"Denies"} Difficulty climbing stairs: {ACTIONS;DENIES/REPORTS:21021675::"Denies"} Difficulty getting out of chair: {ACTIONS;DENIES/REPORTS:21021675::"Denies"} Difficulty using hands for taps, buttons, cutlery, and/or writing: {ACTIONS;DENIES/REPORTS:21021675::"Denies"}  No Rheumatology ROS completed.   PMFS History:  Patient Active Problem List   Diagnosis Date Noted  . History of asthma 02/16/2020  . History of migraine 02/16/2020  . History of bipolar disorder 02/16/2020  . Paresthesia 12/07/2019  . Whole body pain 12/07/2019  . Chronic female pelvic pain 12/29/2018  . Endometriosis of pelvis 12/29/2018    Past Medical History:  Diagnosis Date  . Anxiety   . Asthma   . Bipolar disorder ([place])   . 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 Stage I.  . Muscle spasm   . Osteochondroma   . Thyroid disease     Family History  Problem Relation Age of Onset  . Asthma Other   . Heart Problems Mother   . Ovarian cancer Mother 57  . Fibromyalgia Mother   . Asthma Father   . Heart Problems Maternal Grandfather   . Autism Sister   . Multiple sclerosis Paternal Uncle   . Lupus Cousin   . Autism Sister    Past Surgical History:  Procedure Laterality Date  . BIOPSY N/A 12/29/2018   Procedure: Peritoneal biopsies;  Surgeon: Osborne Oman, MD;   Location: [place];  Service: Gynecology;  Laterality: N/A;  . LAPAROSCOPY N/A 12/29/2018   Procedure: LAPAROSCOPY DIAGNOSTIC;  Surgeon: Osborne Oman, MD;  Location: [place];  Service: Gynecology;  Laterality: N/A;   Social History   Social History Narrative   Lives at home with fiance.   Right-handed.   One cup soda daily.   Immunization History  Administered Date(s) Administered  . HPV 9-valent 12/22/2018, 03/15/2019     Objective: Vital Signs: LMP 02/10/2020 (Exact Date)    Physical Exam   Musculoskeletal Exam: ***  CDAI Exam: CDAI Score: -- Patient Global: --; Provider Global: -- Swollen: --; Tender: -- Joint Exam 03/08/2020   No joint exam has been documented for this visit   There is currently no information documented on the homunculus. Go to the Rheumatology activity and complete the homunculus joint exam.  Investigation: No additional findings.  Imaging: No results found.  Recent Labs: Lab Results  Component Value Date   WBC 4.9 02/09/2020   HGB 12.1 02/09/2020   PLT 319 02/09/2020   NA 140 02/09/2020   K 3.8 02/09/2020   CL 108 02/09/2020   CO2 25 02/09/2020   GLUCOSE 81 02/09/2020   BUN 6 02/09/2020   CREATININE 0.74 02/09/2020   BILITOT 0.2 12/07/2019   ALKPHOS 79 12/07/2019   AST 10 12/07/2019   ALT 7 12/07/2019   PROT 7.5 12/07/2019   ALBUMIN 4.7 12/07/2019   CALCIUM 9.5 02/09/2020   GFRAA >60  02/09/2020    Speciality Comments: No specialty comments available.  Procedures:  No procedures performed Allergies: Adhesive [tape], Shellfish allergy, Latex, and Peanut-containing drug products   Assessment / Plan:     Visit Diagnoses: No diagnosis found.  Orders: No orders of the defined types were placed in this encounter.  No orders of the defined types were placed in this encounter.   Face-to-face time spent with patient was *** minutes. Greater than 50% of time was spent in counseling and  coordination of care.  Follow-Up Instructions: No follow-ups on file.   Bo Merino, MD  Note - This record has been created using Editor, commissioning.  Chart creation errors have been sought, but may not always  have been located. Such creation errors do not reflect on  the standard of medical care.

## 2020-03-08 ENCOUNTER — Ambulatory Visit: Payer: Medicare Other | Admitting: Rheumatology

## 2020-04-11 ENCOUNTER — Other Ambulatory Visit: Payer: Self-pay

## 2020-04-11 ENCOUNTER — Emergency Department (HOSPITAL_COMMUNITY): Payer: Medicare Other

## 2020-04-11 ENCOUNTER — Emergency Department (HOSPITAL_COMMUNITY)
Admission: EM | Admit: 2020-04-11 | Discharge: 2020-04-11 | Disposition: A | Discharge status: Home / Self Care | Payer: Medicare Other | Attending: Emergency Medicine | Admitting: Emergency Medicine

## 2020-04-11 ENCOUNTER — Encounter (HOSPITAL_COMMUNITY): Payer: Self-pay | Admitting: *Deleted

## 2020-04-11 DIAGNOSIS — J45909 Unspecified asthma, uncomplicated: Secondary | ICD-10-CM | POA: Diagnosis not present

## 2020-04-11 DIAGNOSIS — R0602 Shortness of breath: Secondary | ICD-10-CM | POA: Diagnosis not present

## 2020-04-11 DIAGNOSIS — Z91010 Allergy to peanuts: Secondary | ICD-10-CM | POA: Insufficient documentation

## 2020-04-11 DIAGNOSIS — Z91040 Latex allergy status: Secondary | ICD-10-CM | POA: Insufficient documentation

## 2020-04-11 DIAGNOSIS — R079 Chest pain, unspecified: Secondary | ICD-10-CM

## 2020-04-11 DIAGNOSIS — U071 COVID-19: Secondary | ICD-10-CM | POA: Diagnosis not present

## 2020-04-11 DIAGNOSIS — R519 Headache, unspecified: Secondary | ICD-10-CM | POA: Diagnosis present

## 2020-04-11 LAB — CBC WITH DIFFERENTIAL/PLATELET
Abs Immature Granulocytes: 0.01 10*3/uL (ref 0.00–0.07)
Basophils Absolute: 0 10*3/uL (ref 0.0–0.1)
Basophils Relative: 0 %
Eosinophils Absolute: 0 10*3/uL (ref 0.0–0.5)
Eosinophils Relative: 0 %
HCT: 43.2 % (ref 36.0–46.0)
Hemoglobin: 13.1 g/dL (ref 12.0–15.0)
Immature Granulocytes: 0 %
Lymphocytes Relative: 36 %
Lymphs Abs: 1.9 10*3/uL (ref 0.7–4.0)
MCH: 23 pg — ABNORMAL LOW (ref 26.0–34.0)
MCHC: 30.3 g/dL (ref 30.0–36.0)
MCV: 75.8 fL — ABNORMAL LOW (ref 80.0–100.0)
Monocytes Absolute: 0.7 10*3/uL (ref 0.1–1.0)
Monocytes Relative: 13 %
Neutro Abs: 2.7 10*3/uL (ref 1.7–7.7)
Neutrophils Relative %: 51 %
Platelets: 321 10*3/uL (ref 150–400)
RBC: 5.7 MIL/uL — ABNORMAL HIGH (ref 3.87–5.11)
RDW: 13.9 % (ref 11.5–15.5)
WBC: 5.3 10*3/uL (ref 4.0–10.5)
nRBC: 0 % (ref 0.0–0.2)

## 2020-04-11 LAB — URINALYSIS, ROUTINE W REFLEX MICROSCOPIC
Bacteria, UA: NONE SEEN
Bilirubin Urine: NEGATIVE
Glucose, UA: NEGATIVE mg/dL
Ketones, ur: NEGATIVE mg/dL
Leukocytes,Ua: NEGATIVE
Nitrite: NEGATIVE
Protein, ur: NEGATIVE mg/dL
Specific Gravity, Urine: 1.009 (ref 1.005–1.030)
pH: 6 (ref 5.0–8.0)

## 2020-04-11 LAB — HEPATIC FUNCTION PANEL
ALT: 25 U/L (ref 0–44)
AST: 17 U/L (ref 15–41)
Albumin: 4.7 g/dL (ref 3.5–5.0)
Alkaline Phosphatase: 63 U/L (ref 38–126)
Bilirubin, Direct: 0.1 mg/dL (ref 0.0–0.2)
Indirect Bilirubin: 0.3 mg/dL (ref 0.3–0.9)
Total Bilirubin: 0.4 mg/dL (ref 0.3–1.2)
Total Protein: 7.7 g/dL (ref 6.5–8.1)

## 2020-04-11 LAB — BASIC METABOLIC PANEL
Anion gap: 8 (ref 5–15)
BUN: 7 mg/dL (ref 6–20)
CO2: 26 mmol/L (ref 22–32)
Calcium: 9.2 mg/dL (ref 8.9–10.3)
Chloride: 102 mmol/L (ref 98–111)
Creatinine, Ser: 0.61 mg/dL (ref 0.44–1.00)
GFR, Estimated: >60 mL/min (ref >60)
Glucose, Bld: 97 mg/dL (ref 70–99)
Potassium: 3.9 mmol/L (ref 3.5–5.1)
Sodium: 136 mmol/L (ref 135–145)

## 2020-04-11 LAB — I-STAT BETA HCG BLOOD, ED (MC, WL, AP ONLY): I-stat hCG, quantitative: <5 m[IU]/mL (ref <5)

## 2020-04-11 LAB — TROPONIN I (HIGH SENSITIVITY): Troponin I (High Sensitivity): 2 ng/L (ref <18)

## 2020-04-11 LAB — LIPASE, BLOOD: Lipase: 53 U/L — ABNORMAL HIGH (ref 11–51)

## 2020-04-11 MED ORDER — ACETAMINOPHEN 500 MG PO TABS
1000.0000 mg | ORAL_TABLET | Freq: Once | ORAL | Status: AC
  Administered 2020-04-11: 1000 mg via ORAL
  Filled 2020-04-11: qty 2

## 2020-04-11 MED ORDER — KETOROLAC TROMETHAMINE 15 MG/ML IJ SOLN: 15.0000 mg | Freq: Once | INTRAMUSCULAR | Status: DC

## 2020-04-11 MED ORDER — ONDANSETRON HCL 4 MG/2ML IJ SOLN
4.0000 mg | Freq: Once | INTRAMUSCULAR | Status: AC
  Administered 2020-04-11: 4 mg via INTRAVENOUS
  Filled 2020-04-11: qty 2

## 2020-04-11 MED ORDER — LACTATED RINGERS IV BOLUS
1000.0000 mL | Freq: Once | INTRAVENOUS | Status: AC
  Administered 2020-04-11: 1000 mL via INTRAVENOUS

## 2020-04-11 NOTE — Discharge Instructions (Addendum)
You can take 600 mg of ibuprofen every 6 hours, you can take 1000 mg of Tylenol every 6 hours, you can alternate these every 3 or you can take them together.  

## 2020-04-11 NOTE — ED Triage Notes (Signed)
Pt complains of headache, difficulty breathing for the past week. She tested positive for COVID yesterday. History of asthma.

## 2020-04-11 NOTE — ED Provider Notes (Signed)
[facility] DEPT Provider Note   CSN: [id number] Arrival date & time: 04/11/20  1420     History Chief Complaint  Patient presents with  . Headache  . Shortness of Breath    Veronica Buchanan is a 24 y.o. female.   Headache Pain location:  Generalized Radiates to:  Does not radiate Onset quality:  Gradual Timing:  Intermittent Progression:  Waxing and waning Context comment:  Covid Relieved by:  Nothing Worsened by:  Nothing Associated symptoms: nausea   Associated symptoms: no abdominal pain, no back pain, no congestion, no cough, no diarrhea, no fever, no focal weakness, no numbness, no visual change and no vomiting   Shortness of Breath Associated symptoms: chest pain and headaches   Associated symptoms: no abdominal pain, no cough, no fever, no rash and no vomiting        Past Medical History:  Diagnosis Date  . Anxiety   . Asthma   . Bipolar disorder ([employer])   . 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 Stage I.  . Muscle spasm   . Osteochondroma   . Thyroid disease     Patient Active Problem List   Diagnosis Date Noted  . History of asthma 02/16/2020  . History of migraine 02/16/2020  . History of bipolar disorder 02/16/2020  . Paresthesia 12/07/2019  . Whole body pain 12/07/2019  . Chronic female pelvic pain 12/29/2018  . Endometriosis of pelvis 12/29/2018    Past Surgical History:  Procedure Laterality Date  . BIOPSY N/A 12/29/2018   Procedure: Peritoneal biopsies;  Surgeon: Osborne Oman, MD;  Location: [place];  Service: Gynecology;  Laterality: N/A;  . LAPAROSCOPY N/A 12/29/2018   Procedure: LAPAROSCOPY DIAGNOSTIC;  Surgeon: Osborne Oman, MD;  Location: [place];  Service: Gynecology;  Laterality: N/A;     OB History    Gravida  0   Para  0   Term  0   Preterm  0   AB  0   Living  0     SAB  0    TAB  0   Ectopic  0   Multiple  0   Live Births  0           Family History  Problem Relation Age of Onset  . Asthma Other   . Heart Problems Mother   . Ovarian cancer Mother 33  . Fibromyalgia Mother   . Asthma Father   . Heart Problems Maternal Grandfather   . Autism Sister   . Multiple sclerosis Paternal Uncle   . Lupus Cousin   . Autism Sister     Social History   Tobacco Use  . Smoking status: Never Smoker  . Smokeless tobacco: Never Used  Vaping Use  . Vaping Use: Never used  Substance Use Topics  . Alcohol use: Yes    Comment: occasional   . Drug use: Yes    Types: Marijuana    Comment: 2-3 times per week    Home Medications Prior to Admission medications   Medication Sig Start Date End Date Taking? Authorizing Provider  albuterol (PROVENTIL HFA;VENTOLIN HFA) 108 (90 BASE) MCG/ACT inhaler Inhale 2 puffs into the lungs every 6 (six) hours as needed for wheezing or shortness of breath.    Yes [provider]  DULoxetine (CYMBALTA) 30 MG capsule Take 1 capsule (30 mg total) by mouth daily. 12/07/19  Yes Krista Blue,  Aliene Beams, MD  levonorgestrel (MIRENA) 20 MCG/24HR IUD 1 each by Intrauterine route once. Placed 2017   Yes [provider]  lisdexamfetamine (VYVANSE) 50 MG capsule Take 50 mg by mouth daily.    Yes [provider]  ondansetron (ZOFRAN ODT) 4 MG disintegrating tablet Take 1 tablet (4 mg total) by mouth every 8 (eight) hours as needed for nausea or vomiting. 02/09/20  Yes Volney American, PA-C  ziprasidone (GEODON) 20 MG capsule Take 20 mg by mouth at bedtime.    Yes Joline Salt, RN  zonisamide (ZONEGRAN) 50 MG capsule Take 50 mg by mouth daily.   Yes [provider]    Allergies    Adhesive [tape], Shellfish allergy, Latex, and Peanut-containing drug products  Review of Systems   Review of Systems  Constitutional: Negative for chills and fever.  HENT: Negative for congestion and rhinorrhea.   Respiratory:  Positive for shortness of breath. Negative for cough.   Cardiovascular: Positive for chest pain. Negative for palpitations.  Gastrointestinal: Positive for nausea. Negative for abdominal pain, diarrhea and vomiting.  Genitourinary: Positive for frequency. Negative for difficulty urinating and dysuria.  Musculoskeletal: Negative for arthralgias and back pain.  Skin: Negative for rash and wound.  Neurological: Positive for headaches. Negative for focal weakness, light-headedness and numbness.    Physical Exam Updated Vital Signs BP 116/75   Pulse (!) 51   Temp 98.5 F (36.9 C)   Resp 19   LMP 03/05/2020   SpO2 100%   Physical Exam Vitals reviewed. Exam conducted with a chaperone present.  Constitutional:      General: She is not in acute distress.    Appearance: Normal appearance.  HENT:     Head: Normocephalic and atraumatic.     Nose: No rhinorrhea.     Mouth/Throat:     Mouth: Mucous membranes are moist.     Pharynx: Oropharynx is clear.  Eyes:     General:        Right eye: No discharge.        Left eye: No discharge.     Conjunctiva/sclera: Conjunctivae normal.  Cardiovascular:     Rate and Rhythm: Normal rate and regular rhythm.     Heart sounds: No murmur heard.  No friction rub. No gallop.   Pulmonary:     Effort: Pulmonary effort is normal. No respiratory distress.     Breath sounds: No stridor. No wheezing.  Abdominal:     General: Abdomen is flat. There is no distension.     Palpations: Abdomen is soft.     Tenderness: There is no abdominal tenderness.  Musculoskeletal:        General: No tenderness or signs of injury.     Cervical back: Normal range of motion and neck supple.  Skin:    General: Skin is warm and dry.     Capillary Refill: Capillary refill takes less than 2 seconds.  Neurological:     General: No focal deficit present.     Mental Status: She is alert. Mental status is at baseline.     Motor: No weakness.  Psychiatric:        Mood and  Affect: Mood normal.        Behavior: Behavior normal.     ED Results / Procedures / Treatments   Labs (all labs ordered are listed, but only abnormal results are displayed) Labs Reviewed  CBC WITH DIFFERENTIAL/PLATELET - Abnormal; Notable for the following components:  Result Value   RBC 5.70 (*)    MCV 75.8 (*)    MCH 23.0 (*)    All other components within normal limits  URINALYSIS, ROUTINE W REFLEX MICROSCOPIC - Abnormal; Notable for the following components:   Hgb urine dipstick MODERATE (*)    All other components within normal limits  LIPASE, BLOOD - Abnormal; Notable for the following components:   Lipase 53 (*)    All other components within normal limits  BASIC METABOLIC PANEL  HEPATIC FUNCTION PANEL  I-STAT BETA HCG BLOOD, ED ([facility], [facility], [facility] ONLY)  TROPONIN I (HIGH SENSITIVITY)    EKG EKG Interpretation  Date/Time:  Wednesday April 11 2020 17:51:22 EST Ventricular Rate:  57 PR Interval:    QRS Duration: 83 QT Interval:  419 QTC Calculation: 408 R Axis:   62 Text Interpretation: Sinus rhythm RSR' in V1 or V2, probably normal variant ST elev, probable normal early repol pattern Confirmed by Dewaine Conger [phone]) on 04/11/2020 6:10:16 PM   Radiology DG Chest Portable 1 View  Result Date: 04/11/2020 CLINICAL DATA:  Shortness of breath and dyspnea. COVID-19 virus infection. EXAM: PORTABLE CHEST 1 VIEW COMPARISON:  11/23/2019 FINDINGS: The heart size and mediastinal contours are within normal limits. Both lungs are clear. The visualized skeletal structures are unremarkable. IMPRESSION: No active disease. Electronically Signed   By: Marlaine Hind M.D.   On: 04/11/2020 18:47    Procedures Ultrasound ED Echo  Date/Time: 04/11/2020 6:51 PM Performed by: Breck Coons, MD Authorized by: Breck Coons, MD   Procedure details:    Indications: chest pain     Views: subxiphoid, parasternal long axis view, parasternal short axis view, apical 4 chamber view and IVC view      Images: archived   Findings:    Pericardium: no pericardial effusion     LV Function: normal (>50% EF)     RV Diameter: normal     IVC: normal   Impression:    Impression: normal     (including critical care time)  Medications Ordered in ED Medications  ondansetron (ZOFRAN) injection 4 mg (4 mg Intravenous Given 04/11/20 1806)  lactated ringers bolus 1,000 mL (0 mLs Intravenous Stopped 04/11/20 2021)  acetaminophen (TYLENOL) tablet 1,000 mg (1,000 mg Oral Given 04/11/20 1805)    ED Course  I have reviewed the triage vital signs and the nursing notes.  Pertinent labs & imaging results that were available during my care of the patient were reviewed by me and considered in my medical decision making (see chart for details).    MDM Rules/Calculators/A&P                          Covid positive, URI type symptoms.  Chest pain will get evaluation with EKG troponin chest x-ray, PERC negative so no further work-up for PE needed.  Clear lungs on auscultation.  Will check for pregnancy test will check urine as she is having urinary frequency.  Toradol given Zofran given IV fluids given.  Lab studies reviewed by myself are unremarkable.  Chest x-ray reviewed by me and radiology shows no acute cardiopulmonary pathology.  EKG shows sinus rhythm without acute ischemic change interval abnormality or arrhythmia.  Bedside ultrasound shows no effusion, unlikely to be any acute life-threatening pathology.  She is safe for discharge home strict return precautions.  Likely related to Covid.  Final Clinical Impression(s) / ED Diagnoses Final diagnoses:  COVID  SOB (shortness of  breath)  Chest pain, unspecified type    Rx / DC Orders ED Discharge Orders    None       Breck Coons, MD 04/11/20 2028

## 2020-04-12 ENCOUNTER — Ambulatory Visit (HOSPITAL_COMMUNITY): Payer: Self-pay

## 2020-04-24 ENCOUNTER — Other Ambulatory Visit: Payer: Self-pay

## 2020-04-24 ENCOUNTER — Encounter: Payer: Self-pay | Admitting: Allergy & Immunology

## 2020-04-24 ENCOUNTER — Ambulatory Visit (INDEPENDENT_AMBULATORY_CARE_PROVIDER_SITE_OTHER): Payer: Medicare Other | Admitting: Allergy & Immunology

## 2020-04-24 VITALS — BP 100/70 | HR 66 | Temp 98.0°F | Resp 18 | Ht 66.0 in | Wt 133.4 lb

## 2020-04-24 DIAGNOSIS — J302 Other seasonal allergic rhinitis: Secondary | ICD-10-CM

## 2020-04-24 DIAGNOSIS — T7800XD Anaphylactic reaction due to unspecified food, subsequent encounter: Secondary | ICD-10-CM | POA: Diagnosis not present

## 2020-04-24 DIAGNOSIS — J4520 Mild intermittent asthma, uncomplicated: Secondary | ICD-10-CM | POA: Diagnosis not present

## 2020-04-24 DIAGNOSIS — J3089 Other allergic rhinitis: Secondary | ICD-10-CM

## 2020-04-24 MED ORDER — EPINEPHRINE 0.3 MG/0.3ML IJ SOAJ: 0.3000 mg | Freq: Once | INTRAMUSCULAR | 1 refills | Status: AC

## 2020-04-24 NOTE — Progress Notes (Signed)
NEW PATIENT  Date of Service/Encounter:  04/24/20  Referring provider: Berkley Harvey, NP   Assessment:   Seasonal and perennial allergic rhinitis  Mild intermittent asthma, uncomplicated  Anaphylactic shock due to food  Disabled status due to bipolar disorder  Plan/Recommendations:   1. Seasonal and perennial allergic rhinitis - Testing today showed: grasses, weeds, indoor molds and outdoor molds - Copy of test results provided.  - Avoidance measures provided. - Continue with: Zyrtec (cetirizine) 10mg  tablet once daily  - You can use an extra dose of the antihistamine, if needed, for breakthrough symptoms.  - Consider nasal saline rinses 1-2 times daily to remove allergens from the nasal cavities as well as help with mucous clearance (this is especially helpful to do before the nasal sprays are given)  2. Mild intermittent asthma, uncomplicated - Lung testing deferred today. - We do not need a controller medication at this time. - We will continue with albuterol as needed.  3. Anaphylactic shock due to food - Testing was positive only to pecan. - Avoid all tree nuts to be on the safe side. - Re-introduce peanuts into your diet. - Anaphylaxis management plan provided. - EpiPen training provided.   4. Return in about 6 months (around 10/22/2020).    Subjective:   Veronica Buchanan is a 24 y.o. female presenting today for evaluation of  Chief Complaint  Patient presents with  . Food Intolerance    seafood, m&m's ? pound cake/nuts   . Allergic Rhinitis     dogs, cats, pollens     Veronica Buchanan has a history of the following: Patient Active Problem List   Diagnosis Date Noted  . History of asthma 02/16/2020  . History of migraine 02/16/2020  . History of bipolar disorder 02/16/2020  . Paresthesia 12/07/2019  . Whole body pain 12/07/2019  . Chronic female pelvic pain 12/29/2018  . 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  Veronica Buchanan was referred by Berkley Harvey, NP.     Veronica Buchanan is a 24 y.o. female presenting for an evaluation of allergies and asthma. She was previously followed by Dr. Ishmael Holter, but it has been several years since her last visit.  Her last visit was in December 2016.  At that time, she was continued on Zyrtec 10 mg daily as well as Flonase 1 to 2 sprays per nostril daily.  She was also continued on ProAir as needed with Qvar 80 mcg 2 puffs twice daily uses her controller.   She has been in the [place] around for 6 years now.  Prior to this, she lived in [place], [place].    Asthma/Respiratory Symptom History: She does have a history of asthma. When she overdose things, symptoms worsen. She does not take a daily medication for her asthma. It is rarely an issue. This is mostly related to physical activity.   Allergic Rhinitis Symptom History: She has been tested in the past at age 41. She was positive to roaches as well as molds. She does have sneezing. She takes cetirizine for her symptoms as needed. She does not need antibiotics frequently.  Food Allergy Symptom History: She has a history of food allergies. She had regular M&Ms once and she ate around half a bag of snack size. She had trouble breathing and went to the hospital. She had two slices of a pound cake that she bought at the store. She had some throat tightness  and she experienced some difficulty breathing. She took some Benadrtyl. This was two months ago. This was after the M&M episdoe. She does not have an EpiPen at all.   She has a history of bipolar disorder.  She has been through multiple medication changes and finally has a regimen that is working.  Otherwise, there is no history of other atopic diseases, including drug allergies, stinging insect allergies, eczema, urticaria or contact dermatitis. There is no significant infectious history. Vaccinations are up to date.    Past Medical  History: Patient Active Problem List   Diagnosis Date Noted  . History of asthma 02/16/2020  . History of migraine 02/16/2020  . History of bipolar disorder 02/16/2020  . Paresthesia 12/07/2019  . Whole body pain 12/07/2019  . Chronic female pelvic pain 12/29/2018  . 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 If you have any questions, ask your nurse or doctor.        albuterol 108 (90 Base) MCG/ACT inhaler Commonly known as: VENTOLIN HFA Inhale 2 puffs into the lungs every 6 (six) hours as needed for wheezing or shortness of breath.   DULoxetine 30 MG capsule Commonly known as: Cymbalta Take 1 capsule (30 mg total) by mouth daily.   EPINEPHrine 0.3 mg/0.3 mL Soaj injection Commonly known as: EpiPen 2-Pak Inject 0.3 mg into the muscle once for 1 dose. Started by: Veronica Shaggy, Veronica Buchanan   levonorgestrel 20 MCG/24HR IUD Commonly known as: MIRENA 1 each by Intrauterine route once. Placed 2017   lisdexamfetamine 50 MG capsule Commonly known as: VYVANSE Take 50 mg by mouth daily.   ondansetron 4 MG disintegrating tablet Commonly known as: Zofran ODT Take 1 tablet (4 mg total) by mouth every 8 (eight) hours as needed for nausea or vomiting.   sertraline 50 MG tablet Commonly known as: ZOLOFT Take 50 mg by mouth daily.   ziprasidone 20 MG capsule Commonly known as: GEODON Take 20 mg by mouth at bedtime.   zonisamide 50 MG capsule Commonly known as: ZONEGRAN Take 50 mg by mouth daily.       Birth History: non-contributory  Developmental History: non-contributory  Past Surgical History: Past Surgical History:  Procedure Laterality Date  . BIOPSY N/A 12/29/2018   Procedure: Peritoneal  biopsies;  Surgeon: Osborne Oman, Veronica Buchanan;  Location: [facility];  Service: Gynecology;  Laterality: N/A;  . LAPAROSCOPY N/A 12/29/2018   Procedure: LAPAROSCOPY DIAGNOSTIC;  Surgeon: Osborne Oman, Veronica Buchanan;  Location: [facility];  Service: Gynecology;  Laterality: N/A;     Family History: Family History  Problem Relation Age of Onset  . Asthma Other   . Heart Problems Mother   . Ovarian cancer Mother 46  . Fibromyalgia Mother   . Asthma Father   . Heart Problems Maternal Grandfather   . Autism Sister   . Multiple sclerosis Paternal Uncle   . Lupus Cousin   . Autism Sister      Social History: Annalei lives at home with her family.  She lives in an apartment with carpeting throughout the apartment.  There is some mildew damage.  She has electric  heating and central cooling.  She does not have pets of brown, but she does have friends ever that have animals.  There are no dust mite covers on the bedding.  She is a smoker inside of the home, as she vapes.  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  [place]   Review of Systems  Constitutional: Negative.  Negative for chills, fever, malaise/fatigue and weight loss.  HENT: Positive for congestion. Negative for ear discharge, ear pain, sinus pain and sore throat.   Eyes: Negative for pain, discharge and redness.  Respiratory: Positive for cough. Negative for sputum production, shortness of breath and wheezing.   Cardiovascular: Negative.  Negative for chest pain and palpitations.  Gastrointestinal: Negative for abdominal pain, constipation, diarrhea, heartburn, nausea and vomiting.  Skin: Negative.  Negative for itching and rash.  Neurological: Negative for dizziness and headaches.  Endo/Heme/Allergies: Positive for environmental allergies. Does not bruise/bleed easily.  Psychiatric/Behavioral: The patient  is nervous/anxious.        Objective:   Blood pressure 100/70, pulse 66, temperature 98 F (36.7 C), temperature source Temporal, resp. rate 18, height 5\' 6"  (1.676 m), weight 133 lb 6.4 oz (60.5 kg), SpO2 98 %. Body mass index is 21.53 kg/m.   Physical Exam:   Physical Exam Constitutional:      Appearance: She is well-developed.     Comments: Very friendly.  HENT:     Head: Normocephalic and atraumatic.     Right Ear: Tympanic membrane, ear canal and external ear normal. No drainage, swelling or tenderness. Tympanic membrane is not injected, scarred, erythematous, retracted or bulging.     Left Ear: Tympanic membrane, ear canal and external ear normal. No drainage, swelling or tenderness. Tympanic membrane is not injected, scarred, erythematous, retracted or bulging.     Nose: No nasal deformity, septal deviation, mucosal edema or rhinorrhea.     Right Turbinates: Enlarged and swollen.     Left Turbinates: Enlarged and swollen.     Right Sinus: No maxillary sinus tenderness or frontal sinus tenderness.     Left Sinus: No maxillary sinus tenderness or frontal sinus tenderness.     Mouth/Throat:     Mouth: Mucous membranes are not pale and not dry.     Pharynx: Uvula midline.  Eyes:     General:        Right eye: No discharge.        Left eye: No discharge.     Conjunctiva/sclera: Conjunctivae normal.     Right eye: Right conjunctiva is not injected. No chemosis.    Left eye: Left conjunctiva is not injected. No chemosis.    Pupils: Pupils are equal, round, and reactive to light.  Cardiovascular:     Rate and Rhythm: Normal rate and regular rhythm.     Heart sounds: Normal heart sounds.  Pulmonary:     Effort: Pulmonary effort is normal. No tachypnea, accessory muscle usage or respiratory distress.     Breath sounds: Normal breath sounds. No wheezing, rhonchi or rales.     Comments: Moving air well in all lung fields.  No increased work of breathing. Chest:     Chest  wall: No tenderness.  Abdominal:     Tenderness: There is no abdominal tenderness. There is no guarding or rebound.  Lymphadenopathy:     Head:     Right side  of head: No submandibular, tonsillar or occipital adenopathy.     Left side of head: No submandibular, tonsillar or occipital adenopathy.     Cervical: No cervical adenopathy.  Skin:    General: Skin is warm.     Capillary Refill: Capillary refill takes less than 2 seconds.     Coloration: Skin is not pale.     Findings: No abrasion, erythema, petechiae or rash. Rash is not papular, urticarial or vesicular.     Comments: No eczematous or urticarial lesions noted.  Neurological:     Mental Status: She is alert.  Psychiatric:        Behavior: Behavior is cooperative.      Diagnostic studies:   Allergy Studies:     Airborne Adult Perc - 04/24/20 1443    Time Antigen Placed 1443    Allergen Manufacturer Lavella Hammock    Location Back    Number of Test 59    1. Control-Buffer 50% Glycerol Negative    2. Control-Histamine 1 mg/ml 2+    3. Albumin saline Negative    4. Inverness Negative    5. [place] Negative    6. Johnson Negative    7. Wilton Blue Negative    8. Meadow Fescue Negative    9. Perennial Rye Negative    10. Sweet Vernal Negative    11. Timothy Negative    12. Cocklebur Negative    13. Burweed Marshelder Negative    14. Ragweed, short Negative    15. Ragweed, Giant Negative    16. Plantain,  English Negative    17. Lamb's Quarters Negative    18. Sheep Sorrell Negative    19. Rough Pigweed Negative    20. Marsh Elder, Rough Negative    21. Mugwort, Common Negative    22. Ash mix Negative    23. Birch mix Negative    24. Beech American Negative    25. Box, Elder Negative    26. Cedar, red Negative    27. Cottonwood, [place] Negative    28. Elm mix Negative    29. Hickory Negative    30. Maple mix Negative    31. Oak, [place] mix Negative    32. Pecan Pollen Negative    33. Pine mix Negative    34.  Sycamore Eastern Negative    35. Pine Hills, Black Pollen Negative    36. Alternaria alternata Negative    37. Cladosporium Herbarum Negative    38. Aspergillus mix Negative    39. Penicillium mix Negative    40. Bipolaris sorokiniana (Helminthosporium) Negative    41. Drechslera spicifera (Curvularia) Negative    42. Mucor plumbeus Negative    43. Fusarium moniliforme Negative    44. Aureobasidium pullulans (pullulara) Negative    45. Rhizopus oryzae Negative    46. Botrytis cinera Negative    47. Epicoccum nigrum Negative    48. Phoma betae Negative    49. Candida Albicans Negative    50. Trichophyton mentagrophytes Negative    51. Mite, D Farinae  5,000 AU/ml Negative    52. Mite, D Pteronyssinus  5,000 AU/ml Negative    53. Cat Hair 10,000 BAU/ml Negative    54.  Dog Epithelia Negative    55. Mixed Feathers Negative    56. Horse Epithelia Negative    57. Cockroach, German Negative    58. Mouse Negative    59. Tobacco Leaf Negative          Intradermal - 04/24/20 1507  Time Antigen Placed 1507    Allergen Manufacturer Greer    Location Arm    Number of Test 13    Control Negative    Guatemala Negative    Johnson 1+    7 Grass 1+    Ragweed mix Negative    Weed mix 1+    Tree mix Negative    Mold 1 1+    Mold 2 1+    Mold 3 Negative    Mold 4 Negative    Cat Negative    Dog Negative    Cockroach Negative    Mite mix Negative          Food Adult Perc - 04/24/20 1400    Time Antigen Placed 1444    Allergen Manufacturer Greer    Location Back    Number of allergen test 18    Control-Histamine 1 mg/ml 2+    1. Peanut Negative    2. Soybean Negative    3. Wheat Negative    4. Sesame Negative    5. Milk, cow Negative    6. Egg White, Chicken Negative    7. Casein Negative    8. Shellfish Mix Negative    9. Fish Mix Negative    10. Cashew Negative    11. [place] Negative    13. Almond Negative    14. Hazelnut Negative    15.  Bolivia nut Negative    16. Coconut Negative    17. Pistachio Negative           Allergy testing results were read and interpreted by myself, documented by clinical staff.         Salvatore Marvel, Veronica Buchanan [facility]

## 2020-04-24 NOTE — Patient Instructions (Addendum)
1. Seasonal and perennial allergic rhinitis - Testing today showed: grasses, weeds, indoor molds and outdoor molds - Copy of test results provided.  - Avoidance measures provided. - Continue with: Zyrtec (cetirizine) 10mg  tablet once daily  - You can use an extra dose of the antihistamine, if needed, for breakthrough symptoms.  - Consider nasal saline rinses 1-2 times daily to remove allergens from the nasal cavities as well as help with mucous clearance (this is especially helpful to do before the nasal sprays are given)  2. Mild intermittent asthma, uncomplicated - Lung testing deferred today. - We do not need a controller medication at this time. - We will continue with albuterol as needed.  3. Anaphylactic shock due to food - Testing was positive only to pecan. - Avoid all tree nuts to be on the safe side. - Re-introduce peanuts into your diet. - Anaphylaxis management plan provided. - EpiPen training provided.   4. Return in about 6 months (around 10/22/2020).    Please inform us of any Emergency Department visits, hospitalizations, or changes in symptoms. Call us before going to the ED for breathing or allergy symptoms since we might be able to fit you in for a sick visit. Feel free to contact us anytime with any questions, problems, or concerns.  It was a pleasure to meet you today!  Websites that have reliable patient information: 1. American Academy of Asthma, Allergy, and Immunology: www.aaaai.org 2. Food Allergy Research and Education (FARE): foodallergy.org 3. Mothers of Asthmatics: http://www.asthmacommunitynetwork.org 4. American College of Allergy, Asthma, and Immunology: www.acaai.org   COVID-19 Vaccine Information can be found at: ShippingScam.co.uk For questions related to vaccine distribution or appointments, please email [email or web address] or call [phone].     Like [place] on [place] and  Instagram for our latest updates!     HAPPY FALL!     Make sure you are registered to vote! If you have moved or changed any of your contact information, you will need to get this updated before voting!  In some cases, you MAY be able to register to vote online: [email or web address]    Reducing Pollen Exposure  The American Academy of Allergy, Asthma and Immunology suggests the following steps to reduce your exposure to pollen during allergy seasons.    1. Do not hang sheets or clothing out to dry; pollen may collect on these items. 2. Do not mow lawns or spend time around freshly cut grass; mowing stirs up pollen. 3. Keep windows closed at night.  Keep car windows closed while driving. 4. Minimize morning activities outdoors, a time when pollen counts are usually at their highest. 5. Stay indoors as much as possible when pollen counts or humidity is high and on windy days when pollen tends to remain in the air longer. 6. Use air conditioning when possible.  Many air conditioners have filters that trap the pollen spores. 7. Use a HEPA room air filter to remove pollen form the indoor air you breathe.  Control of Mold Allergen   Mold and fungi can grow on a variety of surfaces provided certain temperature and moisture conditions exist.  Outdoor molds grow on plants, decaying vegetation and soil.  The major outdoor mold, Alternaria and Cladosporium, are found in very high numbers during hot and dry conditions.  Generally, a late Summer - Fall peak is seen for common outdoor fungal spores.  Rain will temporarily lower outdoor mold spore count, but counts rise rapidly when the rainy period ends.  The most important indoor  molds are Aspergillus and Penicillium.  Dark, humid and poorly ventilated basements are ideal sites for mold growth.  The next most common sites of mold growth are the bathroom and the kitchen.  Outdoor (Seasonal) Mold Control  Positive outdoor  molds via skin testing: Alternaria and Cladosporium  1. Use air conditioning and keep windows closed 2. Avoid exposure to decaying vegetation. 3. Avoid leaf raking. 4. Avoid grain handling. 5. Consider wearing a face mask if working in moldy areas.  6.   Indoor (Perennial) Mold Control   Positive indoor molds via skin testing: Aspergillus and Penicillium  1. Maintain humidity below 50%. 2. Clean washable surfaces with 5% bleach solution. 3. Remove sources e.g. contaminated carpets.

## 2020-04-26 ENCOUNTER — Encounter: Payer: Self-pay | Admitting: Allergy & Immunology

## 2020-05-01 ENCOUNTER — Other Ambulatory Visit: Payer: Self-pay

## 2020-05-01 ENCOUNTER — Ambulatory Visit (INDEPENDENT_AMBULATORY_CARE_PROVIDER_SITE_OTHER): Payer: Medicare Other | Admitting: Obstetrics and Gynecology

## 2020-05-01 ENCOUNTER — Encounter: Payer: Self-pay | Admitting: Obstetrics and Gynecology

## 2020-05-01 VITALS — BP 109/75 | HR 87 | Wt 129.0 lb

## 2020-05-01 DIAGNOSIS — T8332XA Displacement of intrauterine contraceptive device, initial encounter: Secondary | ICD-10-CM

## 2020-05-01 DIAGNOSIS — R102 Pelvic and perineal pain: Secondary | ICD-10-CM

## 2020-05-01 LAB — POCT URINALYSIS DIPSTICK
Bilirubin, UA: NEGATIVE
Glucose, UA: NEGATIVE
Ketones, UA: NEGATIVE
Leukocytes, UA: NEGATIVE
Nitrite, UA: NEGATIVE
Protein, UA: POSITIVE — AB
Spec Grav, UA: 1.01 (ref 1.010–1.025)
Urobilinogen, UA: 0.2 E.U./dL
pH, UA: 7.5 (ref 5.0–8.0)

## 2020-05-01 NOTE — Patient Instructions (Signed)
Pelvic Pain, Female Pelvic pain is pain in your lower belly (abdomen), below your belly button and between your hips. The pain may start suddenly (be acute), keep coming back (be recurring), or last a long time (become chronic). Pelvic pain that lasts longer than 6 months is called chronic pelvic pain. There are many causes of pelvic pain. Sometimes the cause of pelvic pain is not known. Follow these instructions at home:   Take over-the-counter and prescription medicines only as told by your doctor.  Rest as told by your doctor.  Do not have sex if it hurts.  Keep a journal of your pelvic pain. Write down: ? When the pain started. ? Where the pain is located. ? What seems to make the pain better or worse, such as food or your period (menstrual cycle). ? Any symptoms you have along with the pain.  Keep all follow-up visits as told by your doctor. This is important. Contact a doctor if:  Medicine does not help your pain.  Your pain comes back.  You have new symptoms.  You have unusual discharge or bleeding from your vagina.  You have a fever or chills.  You are having trouble pooping (constipation).  You have blood in your pee (urine) or poop (stool).  Your pee smells bad.  You feel weak or light-headed. Get help right away if:  You have sudden pain that is very bad.  Your pain keeps getting worse.  You have very bad pain and also have any of these symptoms: ? A fever. ? Feeling sick to your stomach (nausea). ? Throwing up (vomiting). ? Being very sweaty.  You pass out (lose consciousness). Summary  Pelvic pain is pain in your lower belly (abdomen), below your belly button and between your hips.  There are many possible causes of pelvic pain.  Keep a journal of your pelvic pain. This information is not intended to replace advice given to you by your health care provider. Make sure you discuss any questions you have with your health care provider. Document  Revised: 10/28/2017 Document Reviewed: 10/28/2017 Elsevier Patient Education  2020 Elsevier Inc.  

## 2020-05-01 NOTE — Progress Notes (Signed)
   Subjective:    Patient ID: Veronica Buchanan, female    DOB: 19-Dec-1995, 24 y.o.   MRN: [id number]  Patient presents c/o acute onset of pelvic pain.  She states that the pain has been occurring for about 1 month.  She describes the pain and diffuse, intermittent, and radiating to the back.  The patient rates the pain a 4-8/10.  She reports some nausea and vomiting.  The patient denies any pain with coitus or vaginal discharge.  She's been with her current partner x 4-5 years.  Her form of contraception is an IUD which was placed 32-4 years ago at [facility].  The patient was recently seen by her PCP and they were not able to visualize the IUD strings.       Review of Systems  Constitutional: Positive for fatigue. Negative for activity change, appetite change, chills and fever.  Respiratory: Negative for shortness of breath.   Cardiovascular: Negative for chest pain.  Gastrointestinal: Negative for constipation, diarrhea and rectal pain.  Genitourinary: Negative for dysuria, enuresis, frequency, genital sores, hematuria, urgency and vaginal pain.  Musculoskeletal: Negative for joint swelling.  Skin: Negative for rash.  Neurological: Negative for headaches.  Hematological: Negative for adenopathy. Does not bruise/bleed easily.       Objective:   Physical Exam Vitals reviewed. Exam conducted with a chaperone present.  Constitutional:      General: She is not in acute distress.    Appearance: Normal appearance.  HENT:     Head: Normocephalic and atraumatic.  Abdominal:     General: Abdomen is flat. Bowel sounds are normal. There is no distension.     Palpations: Abdomen is soft. There is no mass.     Tenderness: There is abdominal tenderness. There is no guarding or rebound.  Genitourinary:    General: Normal vulva.     Pubic Area: No rash.      Labia:        Right: No rash, tenderness or lesion.        Left: No rash, tenderness or lesion.      Urethra: No prolapse,  urethral swelling or urethral lesion.     Vagina: Bleeding present. No tenderness.     Cervix: No cervical motion tenderness or friability.     Uterus: Normal. Not enlarged and not tender.      Adnexa:        Right: Tenderness present. No fullness.         Left: Tenderness present. No fullness.       Comments: IUD string not seen . Lymphadenopathy:     Lower Body: No right inguinal adenopathy. No left inguinal adenopathy.  Neurological:     Mental Status: She is alert.           Assessment & Plan:   Encounter Diagnoses  Name Primary?  . Pelvic pain in female Yes  . Intrauterine contraceptive device threads lost, initial encounter    Patient for pelvic ultrasound to confirm proper IUD placement. Pain may be related to new onset menses.  Will re-evaluate at next appointment.  Pain precautions given.

## 2020-05-01 NOTE — Progress Notes (Signed)
RGYN pt presents for problem visit  CC: Pelvic pain Cramps x 3 days ago and spotting onset today. Nausea and Back pain also green stools. Pt states she has 2-4 BM a day.  Pt states PCP could not find IUD strings  (Mirena) at her visit a few days ago.  UPT was Negative at PCP.

## 2020-05-08 ENCOUNTER — Ambulatory Visit: Payer: Medicare Other | Admitting: Obstetrics

## 2020-05-14 ENCOUNTER — Ambulatory Visit
Admission: RE | Admit: 2020-05-14 | Discharge: 2020-05-14 | Disposition: A | Discharge status: Home / Self Care | Payer: Medicare Other | Source: Ambulatory Visit | Attending: Obstetrics and Gynecology | Admitting: Obstetrics and Gynecology

## 2020-05-14 ENCOUNTER — Other Ambulatory Visit: Payer: Self-pay

## 2020-05-14 DIAGNOSIS — R102 Pelvic and perineal pain: Secondary | ICD-10-CM | POA: Diagnosis not present

## 2020-05-14 DIAGNOSIS — T8332XA Displacement of intrauterine contraceptive device, initial encounter: Secondary | ICD-10-CM | POA: Diagnosis present

## 2020-05-15 ENCOUNTER — Encounter: Payer: Self-pay | Admitting: Obstetrics

## 2020-05-15 ENCOUNTER — Ambulatory Visit (INDEPENDENT_AMBULATORY_CARE_PROVIDER_SITE_OTHER): Payer: Medicare Other | Admitting: Obstetrics and Gynecology

## 2020-05-15 VITALS — BP 112/64 | HR 80 | Ht 66.0 in | Wt 135.0 lb

## 2020-05-15 DIAGNOSIS — R102 Pelvic and perineal pain: Secondary | ICD-10-CM | POA: Diagnosis not present

## 2020-05-15 NOTE — Progress Notes (Signed)
GYN presents for [place] results and pelvic pain 8-10/10 x 3+ months, nausea.  Denies bleeding, discharge, chills or fever.Marland Kitchen

## 2020-05-15 NOTE — Addendum Note (Signed)
Addended by: Courtney Heys on: 05/15/2020 11:32 AM   Modules accepted: Orders

## 2020-05-15 NOTE — Progress Notes (Signed)
   Subjective:    Patient ID: Veronica Buchanan, female    DOB: [birth year], 24 y.o.   MRN: [id number]  Patient presents for fu of right lower quadrant pain.  The patient states that the pain is still an 8/10.  She reports some nausea without vomiting.  The patient states that she really wants to know if her history of endometriosis will keep her from getting pregnant.  She would like to attempt pregnancy very soon.  Review of her recent ultrasound shows an IUD with proper placement.  There is a 15x14x51mm possible right ovarian endometrioma.  Also, a 2.2cm corpus luteum cyst of the left ovary is noted.  The patient also saw her primary care doctor last week for the right sided pain, which she then stated started in her ankle.       Review of Systems  Constitutional: Negative for chills, fatigue and fever.  Respiratory: Negative for chest tightness and shortness of breath.   Cardiovascular: Negative for chest pain, palpitations and leg swelling.  Gastrointestinal: Negative for abdominal distention, anal bleeding, blood in stool, constipation, diarrhea and rectal pain.  Genitourinary: Negative for difficulty urinating, dyspareunia, dysuria, frequency, genital sores and hematuria.  Neurological: Negative for dizziness, weakness, numbness and headaches.  Hematological: Negative for adenopathy. Does not bruise/bleed easily.  Psychiatric/Behavioral: Positive for sleep disturbance. Negative for confusion, decreased concentration, dysphoric mood, self-injury and suicidal ideas. The patient is not nervous/anxious.        Objective:   Physical Exam Vitals reviewed.  Constitutional:      General: She is not in acute distress.    Appearance: Normal appearance.  HENT:     Head: Normocephalic and atraumatic.  Abdominal:     General: Abdomen is flat. Bowel sounds are normal. There is no distension.     Palpations: Abdomen is soft. There is no mass.     Tenderness: There is no abdominal tenderness.  There is no guarding or rebound.  Genitourinary:    Comments: Patient declined pelvic exam Musculoskeletal:        General: No swelling, tenderness or deformity.  Skin:    General: Skin is warm and dry.  Neurological:     Mental Status: She is alert and oriented to person, place, and time.  Psychiatric:        Mood and Affect: Mood normal.           Assessment & Plan:   Encounter Diagnosis  Name Primary?  . Pelvic pain in female Yes   - Recommend evaluation for non-gynecologic causes of pelvic pain. - Will fu on STD screen. - Will repeat ultrasound in 3 months to re-evaluate the right ovary.

## 2020-05-15 NOTE — Addendum Note (Signed)
Addended by: Courtney Heys on: 05/15/2020 11:41 AM   Modules accepted: Orders

## 2020-06-26 ENCOUNTER — Encounter: Payer: Self-pay | Admitting: Physician Assistant

## 2020-07-10 ENCOUNTER — Ambulatory Visit (INDEPENDENT_AMBULATORY_CARE_PROVIDER_SITE_OTHER): Payer: Medicare Other | Admitting: Physician Assistant

## 2020-07-10 ENCOUNTER — Encounter: Payer: Self-pay | Admitting: Physician Assistant

## 2020-07-10 ENCOUNTER — Other Ambulatory Visit (INDEPENDENT_AMBULATORY_CARE_PROVIDER_SITE_OTHER): Payer: Medicare Other

## 2020-07-10 VITALS — BP 112/64 | HR 83 | Ht 66.0 in | Wt 131.0 lb

## 2020-07-10 DIAGNOSIS — R1031 Right lower quadrant pain: Secondary | ICD-10-CM

## 2020-07-10 DIAGNOSIS — R1013 Epigastric pain: Secondary | ICD-10-CM

## 2020-07-10 DIAGNOSIS — R195 Other fecal abnormalities: Secondary | ICD-10-CM

## 2020-07-10 LAB — BASIC METABOLIC PANEL
BUN: 7 mg/dL (ref 6–23)
CO2: 29 mEq/L (ref 19–32)
Calcium: 10 mg/dL (ref 8.4–10.5)
Chloride: 103 mEq/L (ref 96–112)
Creatinine, Ser: 0.71 mg/dL (ref 0.40–1.20)
GFR: 118.92 mL/min (ref >60.00)
Glucose, Bld: 93 mg/dL (ref 70–99)
Potassium: 3.5 mEq/L (ref 3.5–5.1)
Sodium: 139 mEq/L (ref 135–145)

## 2020-07-10 LAB — SEDIMENTATION RATE: Sed Rate: 20 mm/hr (ref 0–20)

## 2020-07-10 LAB — C-REACTIVE PROTEIN: CRP: <1 mg/dL (ref 0.5–20.0)

## 2020-07-10 MED ORDER — PANTOPRAZOLE SODIUM 40 MG PO TBEC: 40.0000 mg | DELAYED_RELEASE_TABLET | Freq: Every morning | ORAL | 3 refills | Status: DC

## 2020-07-10 NOTE — Progress Notes (Signed)
Subjective:    Patient ID: Veronica Buchanan, female    DOB: May 19, 1996, 25 y.o.   MRN: [id number]  HPI Veronica Buchanan is a pleasant 25 year old African-American female, new to GI today referred by Zoe Lan, NP for evaluation of abdominal pain. Patient has not had any prior GI evaluation. She does have history of endometriosis, and is status post laparoscopy summer 2021.  Also with history of bipolar disorder, asthma and migraines. Patient says that she has been having problems with epigastric pain over the past year which gradually worsened over the past few months.  She is now describing epigastric and right lower quadrant pain.  She says these pains are present most every day and seem to come and go throughout the day.  She has increase in the right lower quadrant pain with bowel movements or gassiness and describes it as a crampy type of sensation.  She has also been having looser bowel movements over the past 3 months and some urgency for bowel movements.  She is usually having 3-4 bowel movements per day.  Has not noticed any melena or hematochezia.  She has tried coming off of dairy for the most part and says that does help with the looser stools.  She is also been having some intermittent nausea.  She has lost quite a bit of weight over the past year to year and a half.  She reports that she used to weigh 175 pounds and is now at 131.  She feels she has been stable over the past few months. She had previously been taking ibuprofen fairly regularly but stopped that within the past month or so. She does have a prescription for Bentyl and has been using that on a as needed basis but not regularly. There is no family history of IBD or celiac disease. She did have a pelvic ultrasound done in December 2021 which showed a probable small endometrioma 15 mm in the right ovary and IUD in place.  She is not had any other abdominal imaging. Most recent labs November 2021 with normal CBC lipase and  LFTs.  Review of Systems Pertinent positive and negative review of systems were noted in the above HPI section.  All other review of systems was otherwise negative.  Outpatient Encounter Medications as of 07/10/2020  Medication Sig  . albuterol (PROVENTIL HFA;VENTOLIN HFA) 108 (90 BASE) MCG/ACT inhaler Inhale 2 puffs into the lungs every 6 (six) hours as needed for wheezing or shortness of breath.   . dicyclomine (BENTYL) 10 MG capsule Take 10 mg by mouth 2 (two) times daily.  Marland Kitchen levonorgestrel (MIRENA) 20 MCG/24HR IUD 1 each by Intrauterine route once. Placed 2017  . lisdexamfetamine (VYVANSE) 50 MG capsule Take 50 mg by mouth daily.  . methocarbamol (ROBAXIN) 500 MG tablet Take 500 mg by mouth 3 (three) times daily.  . METHYLPREDNISOLONE, PAK, PO Take by mouth. Tapering dose  . ondansetron (ZOFRAN ODT) 4 MG disintegrating tablet Take 1 tablet (4 mg total) by mouth every 8 (eight) hours as needed for nausea or vomiting.  . pantoprazole (PROTONIX) 40 MG tablet Take 1 tablet (40 mg total) by mouth in the morning.  . sertraline (ZOLOFT) 50 MG tablet Take 50 mg by mouth daily.  . ziprasidone (GEODON) 20 MG capsule Take 20 mg by mouth at bedtime.  . [DISCONTINUED] DULoxetine (CYMBALTA) 30 MG capsule Take 1 capsule (30 mg total) by mouth daily. (Patient not taking: Reported on 07/10/2020)  . [DISCONTINUED] zonisamide (ZONEGRAN) 50 MG capsule  Take 50 mg by mouth daily. (Patient not taking: Reported on 07/10/2020)   No facility-administered encounter medications on file as of 07/10/2020.   Allergies  Allergen Reactions  . Adhesive [Tape] Dermatitis  . Shellfish Allergy Swelling  . Justicia Adhatoda (Malabar Nut Tree) [Justicia Adhatoda]     Mainly pecans.  . Latex Itching    burning  . Peanut-Containing Drug Products     Mouth swelling    Patient Active Problem List   Diagnosis Date Noted  . History of asthma 02/16/2020  . History of migraine 02/16/2020  . History of bipolar disorder  02/16/2020  . Paresthesia 12/07/2019  . Whole body pain 12/07/2019  . Chronic female pelvic pain 12/29/2018  . Endometriosis of pelvis 12/29/2018   Social History   Socioeconomic History  . Marital status: Single    Spouse name: Not on file  . Number of children: 0  . Years of education: two years of college  . Highest education level: Not on file  Occupational History  . Occupation: Dance movement psychotherapist   Tobacco Use  . Smoking status: Never Smoker  . Smokeless tobacco: Never Used  Vaping Use  . Vaping Use: Never used  Substance and Sexual Activity  . Alcohol use: Yes    Comment: occasional   . Drug use: Yes    Types: Marijuana    Comment: 2-3 times per week  . Sexual activity: Yes    Birth control/protection: I.U.D.  Other Topics Concern  . Not on file  Social History Narrative   Lives at home with fiance.   Right-handed.   One cup soda daily.   Social Determinants of Health   Financial Resource Strain: Not on file  Food Insecurity: Not on file  Transportation Needs: Not on file  Physical Activity: Not on file  Stress: Not on file  Social Connections: Not on file  Intimate Partner Violence: Not on file    Veronica Buchanan's family history includes Asthma in her father and another family member; Autism in her sister and sister; Colon cancer in her maternal uncle; Fibromyalgia in her mother; Heart Problems in her maternal grandfather and mother; Lupus in her cousin; Multiple sclerosis in her paternal uncle; Ovarian cancer (age of onset: 63) in her mother.      Objective:    Vitals:   07/10/20 0944  BP: 112/64  Pulse: 83  SpO2: 100%    Physical Exam Well-developed well-nourished  AA female in no acute distress.  Height, Weight 131 , BMI 21.1  HEENT; nontraumatic normocephalic, EOMI, PE R LA, sclera anicteric. Oropharynx; not examined today Neck; supple, no JVD Cardiovascular; regular rate and rhythm with S1-S2, no murmur rub or gallop Pulmonary; Clear bilaterally Abdomen;  soft, there is mild tenderness in the epigastrium and is also tender in the right lower quadrant, nondistended, no palpable mass or hepatosplenomegaly, bowel sounds are active Rectal; not done today Skin; benign exam, no jaundice rash or appreciable lesions Extremities; no clubbing cyanosis or edema skin warm and dry Neuro/Psych; alert and oriented x4, grossly nonfocal mood and affect appropriate       Assessment & Plan:   #28 25 year old African-American female with history of endometriosis with 1 year history of both epigastric and right lower quadrant pain, worse over the past few months.  She has also developed frequent loose stools over the past 3 months with some urgency, and intermittent nausea.  Etiology of her symptoms is not clear, she may have a symptoms secondary to endometriosis though  says she did have recent visit with her gynecologist who felt that she is stable from that perspective. Pelvic ultrasound December 2021 with 15 mm probable endometrioma in the right ovary question symptomatic  Will rule out gastropathy, peptic ulcer disease, IBD, other intra-abdominal inflammatory process  #2 bipolar disorder 3.  History of migraines 4.  Asthma  Plan; patient will be scheduled for CT scan of the abdomen and pelvis with contrast Start Protonix 40 mg 1 p.o. every morning AC breakfast Advised she try Bentyl on a more regular basis, 10 mg 1 p.o. every morning and then second dose if needed later in the day. Sed rate, CRP TTG and IgA. H. pylori stool antigen Patient will be established with Dr. Orvan Falconer.  If above work-up unrevealing and symptoms are persisting she may need colonoscopy and EGD which were discussed today.   Veronica Buchanan Oswald Hillock PA-C 07/10/2020   Cc: Iona Hansen, NP

## 2020-07-10 NOTE — Patient Instructions (Signed)
If you are age 25 or older, your body mass index should be between 23-30. Your Body mass index is 21.14 kg/m. If this is out of the aforementioned range listed, please consider follow up with your Primary Care Provider.  If you are age 17 or younger, your body mass index should be between 19-25. Your Body mass index is 21.14 kg/m. If this is out of the aformentioned range listed, please consider follow up with your Primary Care Provider.   Your provider has requested that you go to the basement level for lab work before leaving today. Press "B" on the elevator. The lab is located at the first door on the left as you exit the elevator.  You have been scheduled for a CT scan of the abdomen and pelvis at [facility], 1st floor Radiology. You are scheduled on 07/19/2020  at 9:30 am. You should arrive 15 minutes prior to your appointment time for registration.  Please pick up 2 bottles of contrast from [place] at least 3 days prior to your scan. The solution may taste better if refrigerated, but do NOT add ice or any other liquid to this solution. Shake well before drinking.   Check in at Admitting Please follow the written instructions below on the day of your exam:   1) Do not eat anything after 5:30 am (4 hours prior to your test)   2) Drink 1 bottle of contrast @ 7:30 am (2 hours prior to your exam)  Remember to shake well before drinking and do NOT pour over ice.     Drink 1 bottle of contrast @ 8:30 am (1 hour prior to your exam)   You may take any medications as prescribed with a small amount of water, if necessary. If you take any of the following medications: METFORMIN, GLUCOPHAGE, GLUCOVANCE, AVANDAMET, RIOMET, FORTAMET, [place] MET, JANUMET, GLUMETZA or METAGLIP, you MAY be asked to HOLD this medication 48 hours AFTER the exam.   The purpose of you drinking the oral contrast is to aid in the visualization of your intestinal tract. The contrast solution may cause some diarrhea.  Depending on your individual set of symptoms, you may also receive an intravenous injection of x-ray contrast/dye. Plan on being at [facility] for 45 minutes or longer, depending on the type of exam you are having performed.   If you have any questions regarding your exam or if you need to reschedule, you may call [facility] at [phone] between the hours of 8:00 am and 5:00 pm, Monday-Friday.   START Pantoprazole 40 mg 1 tablet every morning (after you have completed the H. Pylori lab)  Continue Bentyl 10 mg 1 capsule every morning for pain and cramping.  Avoid dairy products.  Follow up pending the results of your labs and CT.  Thank you for entrusting me with your care and choosing [facility].  Amy Esterwood, PA-C

## 2020-07-11 LAB — IGA: Immunoglobulin A: 91 mg/dL (ref 47–310)

## 2020-07-11 LAB — TISSUE TRANSGLUTAMINASE, IGA: (tTG) Ab, IgA: <1 U/mL

## 2020-07-18 ENCOUNTER — Telehealth: Payer: Self-pay | Admitting: Physician Assistant

## 2020-07-18 ENCOUNTER — Ambulatory Visit: Payer: Medicare Other | Admitting: Women's Health

## 2020-07-18 NOTE — Telephone Encounter (Signed)
Called patient and let her know I have put 2 more bottles of oral contrast at our front desk for her to pick-up. States she will get them today.

## 2020-07-18 NOTE — Telephone Encounter (Signed)
Pt called to inform that she lost the contrast for her ct scan tomorrow. Pls call her.

## 2020-07-19 ENCOUNTER — Ambulatory Visit (HOSPITAL_COMMUNITY)
Admission: RE | Admit: 2020-07-19 | Discharge: 2020-07-19 | Disposition: A | Discharge status: Home / Self Care | Payer: Medicare Other | Source: Ambulatory Visit | Attending: Physician Assistant | Admitting: Physician Assistant

## 2020-07-19 ENCOUNTER — Other Ambulatory Visit: Payer: Self-pay

## 2020-07-19 DIAGNOSIS — R1013 Epigastric pain: Secondary | ICD-10-CM

## 2020-07-19 DIAGNOSIS — R195 Other fecal abnormalities: Secondary | ICD-10-CM | POA: Diagnosis present

## 2020-07-19 DIAGNOSIS — R1031 Right lower quadrant pain: Secondary | ICD-10-CM | POA: Insufficient documentation

## 2020-07-19 MED ORDER — IOHEXOL 300 MG/ML  SOLN
100.0000 mL | Freq: Once | INTRAMUSCULAR | Status: AC | PRN
  Administered 2020-07-19: 100 mL via INTRAVENOUS

## 2020-07-20 NOTE — Progress Notes (Signed)
Reviewed.  Djon Tith L. Bryla Burek, MD, MPH  

## 2020-07-27 ENCOUNTER — Other Ambulatory Visit: Payer: Medicare Other

## 2020-07-27 DIAGNOSIS — R195 Other fecal abnormalities: Secondary | ICD-10-CM

## 2020-07-27 DIAGNOSIS — R1031 Right lower quadrant pain: Secondary | ICD-10-CM

## 2020-07-27 DIAGNOSIS — R1013 Epigastric pain: Secondary | ICD-10-CM

## 2020-07-29 LAB — H. PYLORI ANTIGEN, STOOL: H pylori Ag, Stl: NEGATIVE

## 2020-08-14 ENCOUNTER — Other Ambulatory Visit: Payer: Self-pay

## 2020-08-14 ENCOUNTER — Ambulatory Visit
Admission: RE | Admit: 2020-08-14 | Discharge: 2020-08-14 | Disposition: A | Discharge status: Home / Self Care | Payer: Medicare Other | Source: Ambulatory Visit | Attending: Obstetrics and Gynecology | Admitting: Obstetrics and Gynecology

## 2020-08-14 DIAGNOSIS — R102 Pelvic and perineal pain: Secondary | ICD-10-CM | POA: Diagnosis present

## 2020-08-20 ENCOUNTER — Other Ambulatory Visit: Payer: Self-pay

## 2020-08-20 ENCOUNTER — Encounter: Payer: Self-pay | Admitting: Obstetrics and Gynecology

## 2020-08-20 ENCOUNTER — Ambulatory Visit (INDEPENDENT_AMBULATORY_CARE_PROVIDER_SITE_OTHER): Payer: Medicare Other | Admitting: Obstetrics and Gynecology

## 2020-08-20 VITALS — BP 116/75 | HR 91 | Ht 66.0 in | Wt 127.4 lb

## 2020-08-20 DIAGNOSIS — N803 Endometriosis of pelvic peritoneum: Secondary | ICD-10-CM

## 2020-08-20 DIAGNOSIS — IMO0002 Reserved for concepts with insufficient information to code with codable children: Secondary | ICD-10-CM

## 2020-08-20 MED ORDER — ORILISSA 150 MG PO TABS: 150.0000 mg | ORAL_TABLET | Freq: Every day | ORAL | 6 refills | Status: DC

## 2020-08-20 NOTE — Progress Notes (Signed)
25 yo P0 presenting to discuss results of recent ultrasound. Patient reports doing well with unchanged abdominal pain.Her abdominal pain is generalized. She reports amenorrhea secondary to IUD. Patient is without any other complaints. Her main concerns is that she desires to conceive in the near future and is uncertain how her fertility will be affected  Past Medical History:  Diagnosis Date  . Anxiety   . Asthma   . Bipolar disorder ([place])   . 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 Stage I.  . Muscle spasm   . Osteochondroma   . Thyroid disease    Past Surgical History:  Procedure Laterality Date  . BIOPSY N/A 12/29/2018   Procedure: Peritoneal biopsies;  Surgeon: Osborne Oman, MD;  Location: [facility];  Service: Gynecology;  Laterality: N/A;  . LAPAROSCOPY N/A 12/29/2018   Procedure: LAPAROSCOPY DIAGNOSTIC;  Surgeon: Osborne Oman, MD;  Location: [facility];  Service: Gynecology;  Laterality: N/A;   Family History  Problem Relation Age of Onset  . Asthma Other   . Heart Problems Mother   . Ovarian cancer Mother 62  . Fibromyalgia Mother   . Asthma Father   . Heart Problems Maternal Grandfather   . Autism Sister   . Multiple sclerosis Paternal Uncle   . Lupus Cousin   . Autism Sister   . Colon cancer Maternal Uncle    Social History   Tobacco Use  . Smoking status: Never Smoker  . Smokeless tobacco: Never Used  Vaping Use  . Vaping Use: Never used  Substance Use Topics  . Alcohol use: Yes    Comment: occasional   . Drug use: Yes    Types: Marijuana    Comment: 2-3 times per week   ROS See pertinent in HPI. All other system reviewed  Blood pressure 116/75, pulse 91, height 5\' 6"  (1.676 m), weight 127 lb 6.4 oz (57.8 kg), last menstrual period 08/14/2020. GENERAL: Well-developed, well-nourished female in no acute distress.  NEURO: alert and oriented x 3  US PELVIS  TRANSVAGINAL NON-OB (TV ONLY)  Result Date: 08/14/2020 CLINICAL DATA:  Chronic RIGHT pelvic pain, RIGHT ovarian cyst, follow-up; LMP 08/14/2020. Laparoscopic cholecystectomy in 2020 with diagnosis of endometriosis. G0 EXAM: ULTRASOUND PELVIS TRANSVAGINAL TECHNIQUE: Transvaginal ultrasound examination of the pelvis was performed including evaluation of the uterus, ovaries, adnexal regions, and pelvic cul-de-sac. COMPARISON:  05/14/2020 Correlation: CT abdomen and pelvis 07/19/2020 FINDINGS: Uterus Measurements: 6.4 x 4.3 x 4.5 cm = volume: 65 mL. Anteverted. Slightly heterogeneous myometrium. No focal mass. Endometrium Thickness: 2 mm. IUD identified within endometrial canal, with suboptimal visualization of orientation due to uterine position. No endometrial fluid. Right ovary Measurements: 4.3 x 3.3 x 4.0 cm = volume: 29.2 mL. Simple cyst within RIGHT ovary 3.2 x 2.3 x 3.1 cm; No follow up imaging recommended. Note: This recommendation does not apply to premenarchal patients or to those with increased risk (genetic, family history, elevated tumor markers or other high-risk factors) of ovarian cancer. Reference: Radiology 2019 Nov; 293(2):359-371. Additional small hypoechoic nodule 13 x 13 x 10 mm, previously 15 x 14 x 11 mm, again containing hypoechoic homogeneous echogenicity throughout, question small endometrioma, not significantly changed. Left ovary Measurements: 2.8 x 1.5 x 1.9 cm = volume: 4.1 mL. Normal morphology without mass Other findings:  No free pelvic fluid.  No adnexal masses. IMPRESSION: Simple cyst RIGHT ovary 3.2 cm diameter; no follow-up imaging recommended as above. Persistent visualization  of a homogeneous hypoechoic nodule within RIGHT ovary 13 mm diameter, favor endometrioma; if not resected, annual follow-up ultrasound recommended. Electronically Signed   By: Lavonia Dana M.D.   On: 08/14/2020 14:32    A/P 25 yo P0 with persistent endometrioma and abdominal pain - Reviewed ultrasound  findings with the patient - Would not recommend surgical evaluation at this time given small size of endometrioma - Discussed benefits of [place] with endometriosis pain - Patient scheduled for endoscopy and colonoscopy in the next few weeks as abdominal pain work up - RTC in 3 months if no improvement with Freida Busman

## 2020-08-20 NOTE — Progress Notes (Signed)
Pt here to go over results to ultrasound from 08/14/20. LMP: 08/14/20. Results in chart.

## 2020-08-22 ENCOUNTER — Telehealth: Payer: Self-pay

## 2020-08-22 NOTE — Telephone Encounter (Signed)
Prior Authorization has been approved for patient Veronica Buchanan 150mg   Reference # [id number]

## 2020-09-17 ENCOUNTER — Emergency Department (HOSPITAL_BASED_OUTPATIENT_CLINIC_OR_DEPARTMENT_OTHER)
Admission: EM | Admit: 2020-09-17 | Discharge: 2020-09-17 | Disposition: A | Discharge status: Home / Self Care | Payer: Medicare Other | Attending: Emergency Medicine | Admitting: Emergency Medicine

## 2020-09-17 ENCOUNTER — Emergency Department (HOSPITAL_BASED_OUTPATIENT_CLINIC_OR_DEPARTMENT_OTHER): Payer: Medicare Other | Admitting: Radiology

## 2020-09-17 ENCOUNTER — Other Ambulatory Visit: Payer: Self-pay

## 2020-09-17 ENCOUNTER — Encounter (HOSPITAL_BASED_OUTPATIENT_CLINIC_OR_DEPARTMENT_OTHER): Payer: Self-pay | Admitting: Emergency Medicine

## 2020-09-17 DIAGNOSIS — M25561 Pain in right knee: Secondary | ICD-10-CM | POA: Diagnosis present

## 2020-09-17 DIAGNOSIS — Y92410 Unspecified street and highway as the place of occurrence of the external cause: Secondary | ICD-10-CM | POA: Diagnosis not present

## 2020-09-17 DIAGNOSIS — Z5321 Procedure and treatment not carried out due to patient leaving prior to being seen by health care provider: Secondary | ICD-10-CM | POA: Insufficient documentation
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
## 2020-10-01 ENCOUNTER — Other Ambulatory Visit: Payer: Self-pay

## 2020-10-01 ENCOUNTER — Ambulatory Visit (AMBULATORY_SURGERY_CENTER): Payer: Self-pay | Admitting: *Deleted

## 2020-10-01 VITALS — Ht 66.5 in | Wt 123.0 lb

## 2020-10-01 DIAGNOSIS — R1013 Epigastric pain: Secondary | ICD-10-CM

## 2020-10-01 DIAGNOSIS — R1031 Right lower quadrant pain: Secondary | ICD-10-CM

## 2020-10-01 DIAGNOSIS — R195 Other fecal abnormalities: Secondary | ICD-10-CM

## 2020-10-01 MED ORDER — ONDANSETRON HCL 4 MG PO TABS: 4.0000 mg | ORAL_TABLET | ORAL | 0 refills | Status: DC

## 2020-10-01 MED ORDER — SUPREP BOWEL PREP KIT 17.5-3.13-1.6 GM/177ML PO SOLN: 1.0000 | Freq: Once | ORAL | 0 refills | Status: AC

## 2020-10-01 NOTE — Progress Notes (Signed)
No egg or soy allergy known to patient  No issues with past sedation with any surgeries or procedures Patient denies ever being told they had issues or difficulty with intubation  No FH of Malignant Hyperthermia No diet pills per patient No home 02 use per patient  No blood thinners per patient  Pt states  issues with constipation 2-3 x a week - usually soft - she can randomly have a hard BM  No A fib or A flutter  EMMI video to pt or via [place] 19 guidelines implemented in [facility] today with Pt and RN  Pt is fully vaccinated  for Covid  Zofran to Pharmacy as pt is having nausea issues already - she thinks the zofran will help her complete the prep  Pt denies dentures. No bonded teeth, no loose or missing teeth  . Due to the COVID-19 pandemic we are asking patients to follow certain guidelines.  Pt aware of COVID protocols and [facility] guidelines

## 2020-10-02 ENCOUNTER — Other Ambulatory Visit: Payer: Self-pay

## 2020-10-02 ENCOUNTER — Telehealth: Payer: Self-pay

## 2020-10-02 NOTE — Telephone Encounter (Signed)
Spoke with [employer] review re: PA denial for Ondansetron. Pt plan will only cover if pt is taking for chemotherapeutic purposes. Pt will likely need to purchase out of pocket.

## 2020-10-02 NOTE — Telephone Encounter (Signed)
DENIAL  Medication: Ondansetron Insurance Company: Medicare/[employer] PA response: DENIED Rationale: UNKNOWN Misc. Notes: Veronica Buchanan (Key: [id number])  This request has been denied. Please note any additional information provided by [employer] at the bottom of your screen. You will also receive a faxed copy of the determination with reasons for the denial.  Veronica Buchanan Key: [facility] - PA Case ID: [id number] - Rx #: [id number] Need help? Call us at [phone] Outcome Deniedtoday PA Case: [id number], Status: Denied. Notification: Completed. Drug Ondansetron HCl 4MG  tablets Form [employer] Medicare 4-Part Electronic PA Form (2017 NCPDP) Original Claim Info [phone] Prior Authorization Required ANDA;T2;B VS D DETERMINATION REQUIRED;Prior Authorization Required;;CALL [phone];OR LOG ON TO [email or web address] TO INITIATE EXCEPTION REQUEST;PROVIDE NOTICE-MEDICARE PRESCRIPTION DRUG

## 2020-10-02 NOTE — Telephone Encounter (Signed)
Unfortunately, PA will be required for any Ondansetron order as below. According to her plan, Phenergan (promethazine) is covered and does not require PA. Out of pocket expense appears to be $1.00 for 30 day supply and $3.00 for 90 day

## 2020-10-02 NOTE — Telephone Encounter (Signed)
As this was recommended by previsit for her colonoscopy prep, a 30 day supply should be more than enough. Hopefully she can afford that option. Thanks.

## 2020-10-02 NOTE — Telephone Encounter (Signed)
PRIOR AUTHORIZATION  PA initiation date: 10/02/20  Medication: Ondansetron Insurance Company: Medicare/Medicaid/[employer] Submission completed electronically through [employer] My Meds: Yes  Will await insurance response re: approval/denial.  Veronica Buchanan (Key: [id number])  This request is still being processed. You may close this dialog, return to your dashboard, and perform other tasks. To check for an update later, open this request again from your dashboard. If you have any questions, please contact [employer] at [phone].  Veronica Buchanan (Key: [id number])  [employer] has received your information, and the request will be reviewed. You may close this dialog, return to your dashboard, and perform other tasks.  You will receive an electronic determination in CoverMyMeds. You can see the latest determination by locating this request on your dashboard or by reopening this request. You will also receive a faxed copy of the determination. If you have any questions please contact [employer] at [phone].  If you need assistance, please chat with CoverMyMeds or call us at [phone].  Veronica Buchanan KeyLovie Macadamia - PA Case ID: [id number] - Rx #: [id number] Need help? Call us at [phone] Status Sent to [employer] Drug Ondansetron HCl 4MG  tablets Form [employer] Medicare 4-Part Electronic PA Form (2017 NCPDP) Original Claim Info [phone] Prior Authorization Required ANDA;T2;B VS D DETERMINATION REQUIRED;Prior Authorization Required;;CALL [phone];OR LOG ON TO [email or web address] TO INITIATE EXCEPTION REQUEST;PROVIDE NOTICE-MEDICARE PRESCRIPTION DRUG

## 2020-10-02 NOTE — Telephone Encounter (Signed)
Please discuss with pt to determine if she is able to afford Ondansetron. PA denied

## 2020-10-03 NOTE — Telephone Encounter (Signed)
Pt states the Zofran was only 10$ she paid that and has the Zofran

## 2020-10-15 ENCOUNTER — Telehealth: Payer: Self-pay | Admitting: Gastroenterology

## 2020-10-15 NOTE — Telephone Encounter (Signed)
Pt called and stated that she has several questions regarding her procedure. Please give her a call. Thank you

## 2020-10-15 NOTE — Telephone Encounter (Signed)
Spoke with the patient and answered her questions.

## 2020-10-17 ENCOUNTER — Other Ambulatory Visit: Payer: Self-pay

## 2020-10-17 ENCOUNTER — Encounter: Payer: Self-pay | Admitting: Gastroenterology

## 2020-10-17 ENCOUNTER — Ambulatory Visit (AMBULATORY_SURGERY_CENTER): Payer: Medicare Other | Admitting: Gastroenterology

## 2020-10-17 VITALS — BP 104/68 | HR 64 | Temp 98.4°F | Resp 12 | Ht 66.0 in | Wt 123.0 lb

## 2020-10-17 DIAGNOSIS — R1031 Right lower quadrant pain: Secondary | ICD-10-CM | POA: Diagnosis not present

## 2020-10-17 DIAGNOSIS — K319 Disease of stomach and duodenum, unspecified: Secondary | ICD-10-CM | POA: Diagnosis not present

## 2020-10-17 DIAGNOSIS — K2970 Gastritis, unspecified, without bleeding: Secondary | ICD-10-CM | POA: Diagnosis not present

## 2020-10-17 DIAGNOSIS — K5730 Diverticulosis of large intestine without perforation or abscess without bleeding: Secondary | ICD-10-CM | POA: Diagnosis not present

## 2020-10-17 DIAGNOSIS — R195 Other fecal abnormalities: Secondary | ICD-10-CM

## 2020-10-17 MED ORDER — SODIUM CHLORIDE 0.9 % IV SOLN: 500.0000 mL | Freq: Once | INTRAVENOUS | Status: DC

## 2020-10-17 NOTE — Op Note (Signed)
[facility] Patient Name: Veronica Buchanan Procedure Date: 10/17/2020 1:28 PM MRN: [id number] Endoscopist: Thornton Park MD, MD Age: 25 Referring MD:  Date of Birth: [birth year] Gender: Female Account #: [id number] Procedure:                Colonoscopy Indications:              Abdominal pain in the right lower quadrant Medicines:                Monitored Anesthesia Care Procedure:                Pre-Anesthesia Assessment:                           - Prior to the procedure, a History and Physical                            was performed, and patient medications and                            allergies were reviewed. The patient's tolerance of                            previous anesthesia was also reviewed. The risks                            and benefits of the procedure and the sedation                            options and risks were discussed with the patient.                            All questions were answered, and informed consent                            was obtained. Prior Anticoagulants: The patient has                            taken no previous anticoagulant or antiplatelet                            agents. ASA Grade Assessment: II - A patient with                            mild systemic disease. After reviewing the risks                            and benefits, the patient was deemed in                            satisfactory condition to undergo the procedure.                           After obtaining informed consent, the colonoscope  was passed under direct vision. Throughout the                            procedure, the patient's blood pressure, pulse, and                            oxygen saturations were monitored continuously. The                            Olympus PCF-H190DL (#[id number]) Colonoscope was                            introduced through the anus and advanced to the 5                            cm into the ileum.  The colonoscopy was performed                            without difficulty. The patient tolerated the                            procedure well. The quality of the bowel                            preparation was good. The terminal ileum, ileocecal                            valve, appendiceal orifice, and rectum were                            photographed. Scope In: 1:42:37 PM Scope Out: 1:55:32 PM Scope Withdrawal Time: 0 hours 9 minutes 38 seconds  Total Procedure Duration: 0 hours 12 minutes 55 seconds  Findings:                 The perianal and digital rectal examinations were                            normal.                           The entire examined colon appeared normal.                           A few small-mouthed diverticula were found in the                            sigmoid colon.                           The terminal ileum appeared normal. Complications:            No immediate complications. Estimated Blood Loss:     Estimated blood loss: none. Impression:               - The entire examined colon and distal terminal  ileum is normal.                           - Mild sigmoid diverticulosis. Recommendation:           - Patient has a contact number available for                            emergencies. The signs and symptoms of potential                            delayed complications were discussed with the                            patient. Return to normal activities tomorrow.                            Written discharge instructions were provided to the                            patient.                           - Resume previous diet. High fiber diet recommended.                           - Continue present medications.                           - Follow-up in the office to review these results. Thornton Park MD, MD 10/17/2020 2:05:00 PM This report has been signed electronically.

## 2020-10-17 NOTE — Patient Instructions (Signed)
YOU HAD AN ENDOSCOPIC PROCEDURE TODAY AT [facility]:   Refer to the procedure report that was given to you for any specific questions about what was found during the examination.  If the procedure report does not answer your questions, please call your gastroenterologist to clarify.  If you requested that your care partner not be given the details of your procedure findings, then the procedure report has been included in a sealed envelope for you to review at your convenience later.  YOU SHOULD EXPECT: Some feelings of bloating in the abdomen. Passage of more gas than usual.  Walking can help get rid of the air that was put into your GI tract during the procedure and reduce the bloating. If you had a lower endoscopy (such as a colonoscopy or flexible sigmoidoscopy) you may notice spotting of blood in your stool or on the toilet paper. If you underwent a bowel prep for your procedure, you may not have a normal bowel movement for a few days.  Please Note:  You might notice some irritation and congestion in your nose or some drainage.  This is from the oxygen used during your procedure.  There is no need for concern and it should clear up in a day or so.  SYMPTOMS TO REPORT IMMEDIATELY:   Following lower endoscopy (colonoscopy or flexible sigmoidoscopy):  Excessive amounts of blood in the stool  Significant tenderness or worsening of abdominal pains  Swelling of the abdomen that is new, acute  Fever of 100F or higher   Following upper endoscopy (EGD)  Vomiting of blood or coffee ground material  New chest pain or pain under the shoulder blades  Painful or persistently difficult swallowing  New shortness of breath  Fever of 100F or higher  Black, tarry-looking stools  For urgent or emergent issues, a gastroenterologist can be reached at any hour by calling [phone]. Do not use MyChart messaging for urgent concerns.    DIET:  We do recommend a small meal at first, but  then you may proceed to your regular diet.  Drink plenty of fluids but you should avoid alcoholic beverages for 24 hours. Follow a High Fiber Diet (see handout given to you by your recovery nurse).  MEDICATIONS: Continue present medications.  Please see handouts given to you by your recovery nurse.  FOLLOW UP: Follow up with Dr. Tarri Glenn in her office for an appointment on November 28, 2020 at 0850.  Thank you for allowing [place] to provide for your healthcare needs today.  ACTIVITY:  You should plan to take it easy for the rest of today and you should NOT DRIVE or use heavy machinery until tomorrow (because of the sedation medicines used during the test).    FOLLOW UP: Our staff will call the number listed on your records 48-72 hours following your procedure to check on you and address any questions or concerns that you may have regarding the information given to you following your procedure. If we do not reach you, we will leave a message.  We will attempt to reach you two times.  During this call, we will ask if you have developed any symptoms of COVID 19. If you develop any symptoms (ie: fever, flu-like symptoms, shortness of breath, cough etc.) before then, please call [phone].  If you test positive for Covid 19 in the 2 weeks post procedure, please call and report this information to [place].    If any biopsies were taken you will be  contacted by phone or by letter within the next 1-3 weeks.  Please call us at [phone] if you have not heard about the biopsies in 3 weeks.    SIGNATURES/CONFIDENTIALITY: You and/or your care partner have signed paperwork which will be entered into your electronic medical record.  These signatures attest to the fact that that the information above on your After Visit Summary has been reviewed and is understood.  Full responsibility of the confidentiality of this discharge information lies with you and/or your care-partner.

## 2020-10-17 NOTE — Op Note (Signed)
[place] [facility] Patient Name: Veronica Buchanan Procedure Date: 10/17/2020 1:29 PM MRN: [id number] Endoscopist: Thornton Park MD, MD Age: 25 Referring MD:  Date of Birth: [birth year] Gender: Female Account #: [id number] Procedure:                Upper GI endoscopy Indications:              Abdominal pain in the right lower quadrant Medicines:                Monitored Anesthesia Care Procedure:                Pre-Anesthesia Assessment:                           - Prior to the procedure, a History and Physical                            was performed, and patient medications and                            allergies were reviewed. The patient's tolerance of                            previous anesthesia was also reviewed. The risks                            and benefits of the procedure and the sedation                            options and risks were discussed with the patient.                            All questions were answered, and informed consent                            was obtained. Prior Anticoagulants: The patient has                            taken no previous anticoagulant or antiplatelet                            agents. ASA Grade Assessment: II - A patient with                            mild systemic disease. After reviewing the risks                            and benefits, the patient was deemed in                            satisfactory condition to undergo the procedure.                           After obtaining informed consent, the endoscope was  passed under direct vision. Throughout the                            procedure, the patient's blood pressure, pulse, and                            oxygen saturations were monitored continuously. The                            Endoscope was introduced through the mouth, and                            advanced to the third part of duodenum. The upper                            GI endoscopy  was accomplished without difficulty.                            The patient tolerated the procedure well. Scope In: Scope Out: Findings:                 The examined esophagus was normal. Biopsies were                            obtained from the proximal and distal esophagus                            with cold forceps for histology of suspected                            eosinophilic esophagitis.                           The entire examined stomach was normal. Biopsies                            were taken with a cold forceps for histology.                            Estimated blood loss was minimal.                           The examined duodenum was normal. Biopsies were                            taken with a cold forceps for histology. Estimated                            blood loss was minimal.                           The exam was otherwise without abnormality. Complications:            No immediate complications. Estimated blood loss:  Minimal. Estimated Blood Loss:     Estimated blood loss was minimal. Impression:               - Normal esophagus. Biopsied.                           - Normal stomach. Biopsied.                           - Normal examined duodenum. Biopsied.                           - The examination was otherwise normal. Recommendation:           - Patient has a contact number available for                            emergencies. The signs and symptoms of potential                            delayed complications were discussed with the                            patient. Return to normal activities tomorrow.                            Written discharge instructions were provided to the                            patient.                           - Resume previous diet.                           - Continue present medications.                           - Await pathology results.                           - Proceed with colonoscopy  as previously planned. Thornton Park MD, MD 10/17/2020 2:02:04 PM This report has been signed electronically.

## 2020-10-17 NOTE — Progress Notes (Signed)
Called to room to assist during endoscopic procedure.  Patient ID and intended procedure confirmed with present staff. Received instructions for my participation in the procedure from the performing physician.  

## 2020-10-17 NOTE — Progress Notes (Signed)
PT taken to PACU. Monitors in place. VSS. Report given to RN. 

## 2020-10-17 NOTE — Progress Notes (Signed)
VS by CW  I have reviewed the patient's medical history in detail and updated the computerized patient record.  

## 2020-10-19 ENCOUNTER — Telehealth: Payer: Self-pay | Admitting: *Deleted

## 2020-10-19 NOTE — Telephone Encounter (Signed)
  Follow up Call-  Call back number 10/17/2020  Post procedure Call Back phone  # [phone]  Permission to leave phone message Yes  Some recent data might be hidden     Patient questions:  Do you have a fever, pain , or abdominal swelling? Yes.   Pain Score  8 *  Have you tolerated food without any problems? Yes.    Have you been able to return to your normal activities? Yes.    Do you have any questions about your discharge instructions: Diet   No. Medications  No. Follow up visit  No.  Do you have questions or concerns about your Care? Yes.    Actions: * If pain score is 4 or above: No action needed, pain <4.   Patient complains of right and left side abdominal pain level of 8.  Able to to eat and drink ok. Has had two small stools since procedure and stated she wasn't able to pass gas yet stated she was "tooting" every time she peed so I explained that she is in fact passing gas.  She had called the on-call number regarding her pain and they advised her to take tylenol and gas-X.  She says that does help but the pain returns.   Will forward to Dr. Tarri Glenn.

## 2020-10-19 NOTE — Telephone Encounter (Signed)
Awaiting pathology results. Please schedule office visit with next available APP. Thanks.

## 2020-10-19 NOTE — Telephone Encounter (Signed)
Patient contacted and scheduled for Veronica Buchanan, [place] on 10/24/20 1:30

## 2020-10-19 NOTE — Telephone Encounter (Signed)
First attempt, left VM.  

## 2020-10-23 ENCOUNTER — Ambulatory Visit: Payer: Medicare Other | Admitting: Allergy & Immunology

## 2020-10-24 ENCOUNTER — Encounter: Payer: Self-pay | Admitting: Gastroenterology

## 2020-10-24 ENCOUNTER — Ambulatory Visit (INDEPENDENT_AMBULATORY_CARE_PROVIDER_SITE_OTHER): Payer: Medicare Other | Admitting: Gastroenterology

## 2020-10-24 VITALS — BP 94/60 | HR 88 | Ht 66.0 in | Wt 127.0 lb

## 2020-10-24 DIAGNOSIS — R110 Nausea: Secondary | ICD-10-CM | POA: Diagnosis not present

## 2020-10-24 DIAGNOSIS — R1013 Epigastric pain: Secondary | ICD-10-CM | POA: Insufficient documentation

## 2020-10-24 DIAGNOSIS — R1011 Right upper quadrant pain: Secondary | ICD-10-CM | POA: Diagnosis not present

## 2020-10-24 HISTORY — DX: Right upper quadrant pain: R10.11

## 2020-10-24 NOTE — Patient Instructions (Addendum)
FDgard use as directed per box instructions.   You will be contacted by [facility] (Your caller ID will indicate phone # [phone]) within the next business 7-10 business days to schedule your HIDA scan. If you have not heard from them within 7-10 business days, please call [facility] Scheduling at [phone] to follow up on the status of your appointment.    _______________________________________________________________ hepatobiliary (HIDA) scan is an imaging procedure used to diagnose problems in the liver, gallbladder and bile ducts. In the HIDA scan, a radioactive chemical or tracer is injected into a vein in your arm. The tracer is handled by the liver like bile. Bile is a fluid produced and excreted by your liver that helps your digestive system break down fats in the foods you eat. Bile is stored in your gallbladder and the gallbladder releases the bile when you eat a meal. A special nuclear medicine scanner (gamma camera) tracks the flow of the tracer from your liver into your gallbladder and small intestine.  During your HIDA scan  You'll be asked to change into a hospital gown before your HIDA scan begins. Your health care team will position you on a table, usually on your back. The radioactive tracer is then injected into a vein in your arm.The tracer travels through your bloodstream to your liver, where it's taken up by the bile-producing cells. The radioactive tracer travels with the bile from your liver into your gallbladder and through your bile ducts to your small intestine.You may feel some pressure while the radioactive tracer is injected into your vein. As you lie on the table, a special gamma camera is positioned over your abdomen taking pictures of the tracer as it moves through your body. The gamma camera takes pictures continually for about an hour. You'll need to keep still during the HIDA scan. This can become uncomfortable, but you may find that  you can lessen the discomfort by taking deep breaths and thinking about other things. Tell your health care team if you're uncomfortable. The radiologist will watch on a computer the progress of the radioactive tracer through your body. The HIDA scan may be stopped when the radioactive tracer is seen in the gallbladder and enters your small intestine. This typically takes about an hour. In some cases extra imaging will be performed if original images aren't satisfactory, if morphine is given to help visualize the gallbladder or if the medication CCK is given to look at the contraction of the gallbladder. This test typically takes 2 hours to complete. _______________________________________________________________  .
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
## 2020-11-05 NOTE — Progress Notes (Signed)
Reviewed and agree with management plans. ? ?Nikitta Sobiech L. Eular Panek, MD, MPH  ?

## 2020-11-08 ENCOUNTER — Encounter (HOSPITAL_COMMUNITY): Admission: RE | Admit: 2020-11-08 | Payer: Medicare Other | Source: Ambulatory Visit

## 2020-11-28 ENCOUNTER — Encounter: Payer: Self-pay | Admitting: Gastroenterology

## 2020-11-28 ENCOUNTER — Ambulatory Visit (INDEPENDENT_AMBULATORY_CARE_PROVIDER_SITE_OTHER): Payer: 59 | Admitting: Gastroenterology

## 2020-11-28 VITALS — BP 92/68 | HR 84 | Ht 66.54 in | Wt 133.4 lb

## 2020-11-28 DIAGNOSIS — R1013 Epigastric pain: Secondary | ICD-10-CM | POA: Diagnosis not present

## 2020-11-28 DIAGNOSIS — R1011 Right upper quadrant pain: Secondary | ICD-10-CM | POA: Diagnosis not present

## 2020-11-28 DIAGNOSIS — R110 Nausea: Secondary | ICD-10-CM

## 2020-11-28 NOTE — Progress Notes (Signed)
Following message sent to Rhys Martini and April Pait:  [facility] Phone: [phone] Fax: [phone]   Imaging Ordered: HIDA scan  Diagnosis: Abdominal pain  Ordering Provider: Dr. Tarri Glenn  Is a Prior Authorization needed? We are in the process of obtaining it now  Is the patient Diabetic? No  Does the patient have Hypertension? No  Does the patient have any implanted devices or hardware? No  Date of last BUN/Creat, if needed? N/A  Patient Weight? 133#  Is the patient able to get on the table? Yes  Has the patient been diagnosed with COVID? No  Is the patient waiting on COVID testing results? No  Thank you for your assistance! [facility] Team
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
## 2020-11-28 NOTE — Patient Instructions (Signed)
It was my pleasure to provide care to you you today. Based on our discussion, I am providing you with my recommendations below:  RECOMMENDATION(S):   Trial of a gluten free diet for 1 week. Please refer to educational material below  Please notify me by MyChart if the gluten free diet did not help to resolve your symptoms. I will make additional recommendations to try a either FODMAP diet and/or Xifaxan  IMAGING:  You will be contacted by [facility] (Your caller ID will indicate phone # [phone]) within the next business 7-10 business days to schedule your HIDA SCAN. If you have not heard from them within 7-10 business days, please call [facility] at [phone] to follow up on the status of your appointment.    FOLLOW UP:  I would like for you to follow up with me if your symptoms do not improve. Please call the office at [phone] to schedule your appointment if needed.  BMI:  If you are age 29 or younger, your body mass index should be between 19-25. Your There is no height or weight on file to calculate BMI. If this is out of the aformentioned range listed, please consider follow up with your Primary Care Provider.   MY CHART:  The [facility] providers would like to encourage you to use [facility] to communicate with providers for non-urgent requests or questions.  Due to long hold times on the telephone, sending your provider a message by [facility] may be a faster and more efficient way to get a response.  Please allow 48 business hours for a response.  Please remember that this is for non-urgent requests.   Thank you for trusting me with your gastrointestinal care!    Thornton Park, MD, MPH  Gluten-Free Diet for Celiac Disease, Adult  The gluten-free diet includes all foods that do not contain gluten. Gluten is a protein that is found in wheat, rye, barley, and some other grains. Following the gluten-free diet is the only treatment  for people with celiac disease. It helps to prevent damage to the intestines andimproves or eliminates the symptoms of celiac disease. Following the gluten-free diet requires some planning. It can be challenging at first, but it gets easier with time and practice. There are more gluten-free options available today than ever before. If you need help finding gluten-free foods or if you have questions, talk with your dietitian or your health careprovider. What are tips for following this plan? Reading food labels Read all food labels. Gluten is often added to foods. Always check the ingredient list and look for warnings, such as "may contain gluten." Foods that list any of these key words on the label usually contain gluten: Wheat, flour, enriched flour, bromated flour, white flour, durum flour, graham flour, phosphated flour, self-rising flour, semolina, farina, barley (malt), rye, and oats. Starch, dextrin, modified food starch, or cereal. Thickening, fillers, or emulsifiers. Malt flavoring, malt extract, or malt syrup. Hydrolyzed vegetable protein. In the [place] packaged foods that are gluten-free are required to be labeled "GF." These foods should be easy to identify and are safe to eat. In the [place] food companies are also required to list common food allergens, includingwheat, on their labels. Shopping When grocery shopping, start by shopping in the produce, meat, and dairy sections. These sections are more likely to contain gluten-free foods. Thenmove to the aisles that contain packaged foods if you need to. Meal planning All fruits, vegetables, and meats are  safe to eat and do not contain gluten. Talk with your dietitian or health care provider before taking a gluten-free multivitamin or mineral supplement. Be aware of gluten-free foods having contact with foods that contain gluten (cross-contamination). This can happen at home and with any processed foods. Talk with your health care provider or  dietitian about how to reduce the risk of cross-contamination in your home. If you have questions about how a food is processed, ask the manufacturer. What foods can I eat? Fruits All plain fresh, frozen, canned, and dried fruits, and 100% fruit juices. Vegetables All plain fresh, frozen, and canned vegetables. Grains Amaranth, bean flours, 100% buckwheat flour, corn, millet, nut flours or nut meals, GF oats, quinoa, rice, sorghum, teff, rice wafers, pure cornmealtortillas, popcorn, and hot cereals made from cornmeal.  Hominy, rice, wild rice. Some Asian rice noodles or bean noodles.  Arrowroot starch, corn bran, corn flour, corn germ, cornmeal, corn starch, potato flour, potato starch flour, and rice bran. Plain, brown, and sweet riceflours. Rice polish, soy flour, and tapioca starch. Meats and other protein foods All fresh beef, pork, poultry, fish, seafood, and eggs. Fish canned in water,oil, brine, or vegetable broth. Plain nuts and seeds, peanut butter.  Some precooked or cured meat, such as sausages or meat loaves. Somefrankfurters. Dried beans, dried peas, and lentils. Dairy Fresh plain, dry, evaporated, or condensed milk. Cream, butter, sour cream, whipping cream, and most yogurts. Unprocessed cheese, most processed cheeses,some cottage cheeses, some cream cheeses. Beverages Coffee, tea, most herbal teas. Carbonated beverages and some root beers. Wine,sake, and distilled spirits, such as gin, vodka, and whiskey. Most hard ciders. Fats and oils Butter, margarine, vegetable oil, hydrogenated butter, olive oil, shortening,lard, cream, and some mayonnaise. Some commercial salad dressings. Olives. Sweets and desserts Sugar, honey, some syrups, molasses, jelly, and jam. Plain hard candy,marshmallows, and gumdrops.  Pure cocoa powder. Plain chocolate. Custard and some pudding mixes. Gelatindesserts, sorbets, frozen ice pops, and sherbet.  Cake, cookies, and other desserts prepared with  allowed flours. Some commercialice creams. Cornstarch, tapioca, and rice puddings. Seasoning and other foods Some canned or frozen soups. Monosodium glutamate (MSG). Cider, rice, and winevinegar. Baking soda and baking powder.  Cream of tartar. Baking and nutritional yeast. Certain soy sauces made withoutwheat (ask your dietitian about specific brands that are allowed).  Nuts, coconut, and chocolate. Salt, pepper, herbs, spices, flavoring extracts,imitation or artificial flavorings, natural flavorings, and food colorings.  Some medicines and supplements. Rice syrups. The items listed above may not be a complete list of foods and beverages you can eat. Contact a dietitian for more information. What foods should I avoid? Fruits Thickened or prepared fruits and some pie fillings. Some fruit snacks and fruitroll-ups. Vegetables Most creamed vegetables and most vegetables canned in sauces. Some commerciallyprepared vegetables and salads. Vegetables in a soy sauce marinade or dressing. Grains Barley, bran, bulgur, couscous, cracked wheat, [place], farro, graham, malt, matzo, semolina, wheat germ, and all wheat and rye cereals including spelt andkamut.  Cereals containing malt as a flavoring, such as rice cereal. Noodles, spaghetti, macaroni, most packaged rice mixes, and all mixes containing wheat,rye, barley, or triticale. Meats and other protein foods Any meat or meat alternative containing wheat, rye, barley, or gluten stabilizers. These are often marinated or packaged meats, and precooked orcured meat, such as sausages or meat loaves. Bread-containing products, such as Swiss steak, croquettes, meatballs, and meatloaf. Most tuna canned in vegetable broth and [place] with hydrolyzedvegetable protein (HVP) injected as part of the basting.  Seitan. Imitation fish. Eggs in sauces made from ingredients to avoid. Dairy Commercial chocolate milk drinks and malted milk. Some non-dairy creamers. Anycheese  product containing ingredients to avoid. Beverages Certain cereal beverages. Beer, ale, malted milk, and some root beers. Some hard ciders. Some instant flavored coffees. Some herbal teas made with barleyor with barley malt added. Fats and oils Some commercial salad dressings. Sour cream containing modified food starch. Sweets and desserts Some toffees. Chocolate-coated nuts (may be rolled in wheat flour) and somecommercial candies and candy bars.  Most cakes, cookies, donuts, pastries, and other baked goods. Some commercialice cream. Ice cream cones.  Commercially prepared mixes for cakes, cookies, and other desserts. Breadpudding and other puddings thickened with flour.  Products containing brown rice syrup made with barley malt enzyme. Desserts andsweets made with malt flavoring. Seasoning and other foods Some curry powders, some dry seasoning mixes, some gravy extracts, some meatsauces, some ketchups, some prepared mustards, and horseradish.  Certain soy sauces. Malt vinegar. Bouillon and bouillon cubes that contain HVP.Some chip dips, and some chewing gum.  Yeast extract. Brewer's yeast. Caramel color.  Some medicines and supplements. The items listed above may not be a complete list of foods and beverages you should avoid. Contact a dietitian for more information. Summary Gluten is a protein that is found in wheat, rye, barley, and some other grains. The gluten-free diet includes all foods that do not contain gluten. If you need help finding gluten-free foods or if you have questions, talk with your dietitian or your health care provider. Read all food labels. Gluten is often added to foods. Always check the ingredient list and look for warnings, such as "may contain gluten." This information is not intended to replace advice given to you by your health care provider. Make sure you discuss any questions you have with your healthcare provider. Document Revised: 05/16/2019 Document Reviewed:  05/16/2019 Elsevier Patient Education  2022 [employer].

## 2020-12-10 ENCOUNTER — Other Ambulatory Visit: Payer: Self-pay

## 2020-12-10 DIAGNOSIS — R1011 Right upper quadrant pain: Secondary | ICD-10-CM

## 2020-12-10 DIAGNOSIS — R110 Nausea: Secondary | ICD-10-CM

## 2020-12-10 DIAGNOSIS — R1013 Epigastric pain: Secondary | ICD-10-CM

## 2020-12-18 ENCOUNTER — Telehealth: Payer: Self-pay | Admitting: Gastroenterology

## 2020-12-18 NOTE — Telephone Encounter (Signed)
Pt states she is in [place] right now having issues with her leg, waiting to see her ortho doc. She is requesting her HIDA scan be set up at [facility]. Please adivse.

## 2020-12-18 NOTE — Telephone Encounter (Signed)
If we have abilities to set up the HIDA in [place] please do. However, she may wish to establish care with a GI in the [place] area (where she lives). Thanks.

## 2020-12-18 NOTE — Telephone Encounter (Signed)
Patient called requesting a referral to get HIDA scan

## 2020-12-19 NOTE — Telephone Encounter (Signed)
Left message for [facility] radiology to call back. [phone].  7/28-left message for radiology to call back.

## 2020-12-24 HISTORY — PX: KNEE SURGERY: SHX244

## 2020-12-31 NOTE — Telephone Encounter (Signed)
Called and left 2 messages for [facility] to call back and never got a call back. Let pt know if she can provide [place] with a better contact/fax number we can order the scan or she may want to see a GI doc in the [place] area. Pt will try to decide the [facility] option for her. Requested she let us know what she decides.

## 2021-03-01 NOTE — Telephone Encounter (Signed)
Patient is calling wanting to proceed with HIDA in [facility]

## 2021-03-01 NOTE — Telephone Encounter (Signed)
Returned pt call. Orders were previously placed for HIDA and pt provided contact info during OV (noted on AVS). Provided her with [facility] [phone] and advised she is welcome call to self schedule. Verbalized acceptance and understanding.

## 2021-03-20 ENCOUNTER — Emergency Department (HOSPITAL_BASED_OUTPATIENT_CLINIC_OR_DEPARTMENT_OTHER): Payer: 59 | Admitting: Radiology

## 2021-03-20 ENCOUNTER — Emergency Department (HOSPITAL_BASED_OUTPATIENT_CLINIC_OR_DEPARTMENT_OTHER)
Admission: EM | Admit: 2021-03-20 | Discharge: 2021-03-20 | Disposition: A | Discharge status: Home / Self Care | Payer: 59 | Attending: Emergency Medicine | Admitting: Emergency Medicine

## 2021-03-20 ENCOUNTER — Other Ambulatory Visit: Payer: Self-pay

## 2021-03-20 ENCOUNTER — Encounter (HOSPITAL_BASED_OUTPATIENT_CLINIC_OR_DEPARTMENT_OTHER): Payer: Self-pay | Admitting: Obstetrics and Gynecology

## 2021-03-20 DIAGNOSIS — Z5321 Procedure and treatment not carried out due to patient leaving prior to being seen by health care provider: Secondary | ICD-10-CM | POA: Insufficient documentation

## 2021-03-20 DIAGNOSIS — R079 Chest pain, unspecified: Secondary | ICD-10-CM | POA: Insufficient documentation

## 2021-03-20 DIAGNOSIS — R1110 Vomiting, unspecified: Secondary | ICD-10-CM | POA: Diagnosis not present

## 2021-03-20 DIAGNOSIS — R531 Weakness: Secondary | ICD-10-CM | POA: Diagnosis not present

## 2021-03-20 DIAGNOSIS — G479 Sleep disorder, unspecified: Secondary | ICD-10-CM | POA: Diagnosis not present

## 2021-03-20 LAB — CBC
HCT: 37.5 % (ref 36.0–46.0)
Hemoglobin: 11.7 g/dL — ABNORMAL LOW (ref 12.0–15.0)
MCH: 23 pg — ABNORMAL LOW (ref 26.0–34.0)
MCHC: 31.2 g/dL (ref 30.0–36.0)
MCV: 73.7 fL — ABNORMAL LOW (ref 80.0–100.0)
Platelets: 294 10*3/uL (ref 150–400)
RBC: 5.09 MIL/uL (ref 3.87–5.11)
RDW: 13.5 % (ref 11.5–15.5)
WBC: 5.1 10*3/uL (ref 4.0–10.5)
nRBC: 0 % (ref 0.0–0.2)

## 2021-03-20 LAB — BASIC METABOLIC PANEL
Anion gap: 9 (ref 5–15)
BUN: 9 mg/dL (ref 6–20)
CO2: 24 mmol/L (ref 22–32)
Calcium: 9.2 mg/dL (ref 8.9–10.3)
Chloride: 106 mmol/L (ref 98–111)
Creatinine, Ser: 0.6 mg/dL (ref 0.44–1.00)
GFR, Estimated: >60 mL/min (ref >60)
Glucose, Bld: 86 mg/dL (ref 70–99)
Potassium: 3.7 mmol/L (ref 3.5–5.1)
Sodium: 139 mmol/L (ref 135–145)

## 2021-03-20 LAB — TROPONIN I (HIGH SENSITIVITY): Troponin I (High Sensitivity): 3 ng/L (ref <18)

## 2021-03-20 NOTE — ED Provider Notes (Signed)
Pt left without being seen. I did not establish care with her   Bishop Dublin 03/20/21 2046    Godfrey Pick, MD 03/21/21 [phone]

## 2021-03-20 NOTE — ED Notes (Signed)
Patient called Several Times for EDP Assessment with No Response.

## 2021-03-20 NOTE — ED Notes (Signed)
Patient not in Waiting Area for EDP Assessment or VS Reassessment. Patient to be discharged accordingly.

## 2021-03-20 NOTE — ED Triage Notes (Signed)
Patient reports she has not slept in 2 days and today feels like she is shaking and she had 1 episode of emesis today. Patient reports feeling weak and when she tried to sleep she wakes up within an hour

## 2021-03-21 ENCOUNTER — Ambulatory Visit (HOSPITAL_COMMUNITY)
Admission: RE | Admit: 2021-03-21 | Discharge: 2021-03-21 | Disposition: A | Discharge status: Home / Self Care | Payer: 59 | Source: Ambulatory Visit | Attending: Family Medicine | Admitting: Family Medicine

## 2021-03-21 ENCOUNTER — Encounter (HOSPITAL_COMMUNITY): Payer: Self-pay

## 2021-03-21 ENCOUNTER — Ambulatory Visit (INDEPENDENT_AMBULATORY_CARE_PROVIDER_SITE_OTHER): Payer: 59

## 2021-03-21 VITALS — BP 112/63 | HR 72 | Temp 99.1°F | Resp 18

## 2021-03-21 DIAGNOSIS — R079 Chest pain, unspecified: Secondary | ICD-10-CM

## 2021-03-21 DIAGNOSIS — R5383 Other fatigue: Secondary | ICD-10-CM

## 2021-03-21 DIAGNOSIS — G4700 Insomnia, unspecified: Secondary | ICD-10-CM | POA: Diagnosis present

## 2021-03-21 DIAGNOSIS — F419 Anxiety disorder, unspecified: Secondary | ICD-10-CM

## 2021-03-21 DIAGNOSIS — R0602 Shortness of breath: Secondary | ICD-10-CM | POA: Diagnosis not present

## 2021-03-21 LAB — CBC WITH DIFFERENTIAL/PLATELET
Abs Immature Granulocytes: 0.01 10*3/uL (ref 0.00–0.07)
Basophils Absolute: 0 10*3/uL (ref 0.0–0.1)
Basophils Relative: 1 %
Eosinophils Absolute: 0.1 10*3/uL (ref 0.0–0.5)
Eosinophils Relative: 1 %
HCT: 41 % (ref 36.0–46.0)
Hemoglobin: 12.6 g/dL (ref 12.0–15.0)
Immature Granulocytes: 0 %
Lymphocytes Relative: 44 %
Lymphs Abs: 2.3 10*3/uL (ref 0.7–4.0)
MCH: 23.2 pg — ABNORMAL LOW (ref 26.0–34.0)
MCHC: 30.7 g/dL (ref 30.0–36.0)
MCV: 75.6 fL — ABNORMAL LOW (ref 80.0–100.0)
Monocytes Absolute: 0.5 10*3/uL (ref 0.1–1.0)
Monocytes Relative: 9 %
Neutro Abs: 2.3 10*3/uL (ref 1.7–7.7)
Neutrophils Relative %: 45 %
Platelets: 308 10*3/uL (ref 150–400)
RBC: 5.42 MIL/uL — ABNORMAL HIGH (ref 3.87–5.11)
RDW: 13.6 % (ref 11.5–15.5)
WBC: 5.1 10*3/uL (ref 4.0–10.5)
nRBC: 0 % (ref 0.0–0.2)

## 2021-03-21 LAB — COMPREHENSIVE METABOLIC PANEL
ALT: 10 U/L (ref 0–44)
AST: 17 U/L (ref 15–41)
Albumin: 4.8 g/dL (ref 3.5–5.0)
Alkaline Phosphatase: 71 U/L (ref 38–126)
Anion gap: 7 (ref 5–15)
BUN: 6 mg/dL (ref 6–20)
CO2: 26 mmol/L (ref 22–32)
Calcium: 9.4 mg/dL (ref 8.9–10.3)
Chloride: 104 mmol/L (ref 98–111)
Creatinine, Ser: 0.67 mg/dL (ref 0.44–1.00)
GFR, Estimated: >60 mL/min (ref >60)
Glucose, Bld: 88 mg/dL (ref 70–99)
Potassium: 3.4 mmol/L — ABNORMAL LOW (ref 3.5–5.1)
Sodium: 137 mmol/L (ref 135–145)
Total Bilirubin: 0.5 mg/dL (ref 0.3–1.2)
Total Protein: 7.7 g/dL (ref 6.5–8.1)

## 2021-03-21 LAB — POCT URINALYSIS DIPSTICK, ED / UC
Glucose, UA: NEGATIVE mg/dL
Leukocytes,Ua: NEGATIVE
Nitrite: NEGATIVE
Protein, ur: NEGATIVE mg/dL
Specific Gravity, Urine: >=1.03 (ref 1.005–1.030)
Urobilinogen, UA: 0.2 mg/dL (ref 0.0–1.0)
pH: 5.5 (ref 5.0–8.0)

## 2021-03-21 LAB — TSH: TSH: 1.663 u[IU]/mL (ref 0.350–4.500)

## 2021-03-21 MED ORDER — HYDROXYZINE HCL 25 MG PO TABS: ORAL_TABLET | ORAL | 0 refills | Status: DC

## 2021-03-21 NOTE — Discharge Instructions (Addendum)
You have been seen at the [facility] today for chest pain. Your evaluation today was not suggestive of any emergent condition requiring medical intervention at this time. Your ECG (heart tracing) did not show any worrisome changes. However, some medical problems make take more time to appear. Therefore, it's very important that you pay attention to any new symptoms or worsening of your current condition.  Please proceed directly to the Emergency Department immediately should you feel worse in any way or have any of the following symptoms: increasing or different chest pain, pain that spreads to your arm, neck, jaw, back or abdomen, shortness of breath, or nausea and vomiting.  You have had labs (blood work) drawn today. We will call you with any significant abnormalities or if there is need to begin or change treatment or pursue further follow up.  You may also review your test results online through [facility]. If you do not have a MyChart account, instructions to sign up should be on your discharge paperwork.
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
## 2021-03-21 NOTE — ED Triage Notes (Signed)
Pt is present today with left side chest pain, slight SOB, and fatigue. Pt sx started x2 days ago.

## 2021-03-22 LAB — POC URINE PREG, ED: Preg Test, Ur: NEGATIVE

## 2021-04-20 ENCOUNTER — Ambulatory Visit (HOSPITAL_COMMUNITY): Payer: 59

## 2021-04-20 ENCOUNTER — Ambulatory Visit: Payer: 59

## 2021-04-23 IMAGING — DX DG CHEST 1V PORT
1 series · 1 of 1 positions shown · non-contrast
Comparison: 11/23/2019

CLINICAL DATA: Shortness of breath and dyspnea. [id number] virus
infection.

EXAM:
PORTABLE CHEST 1 VIEW

[chest ap]
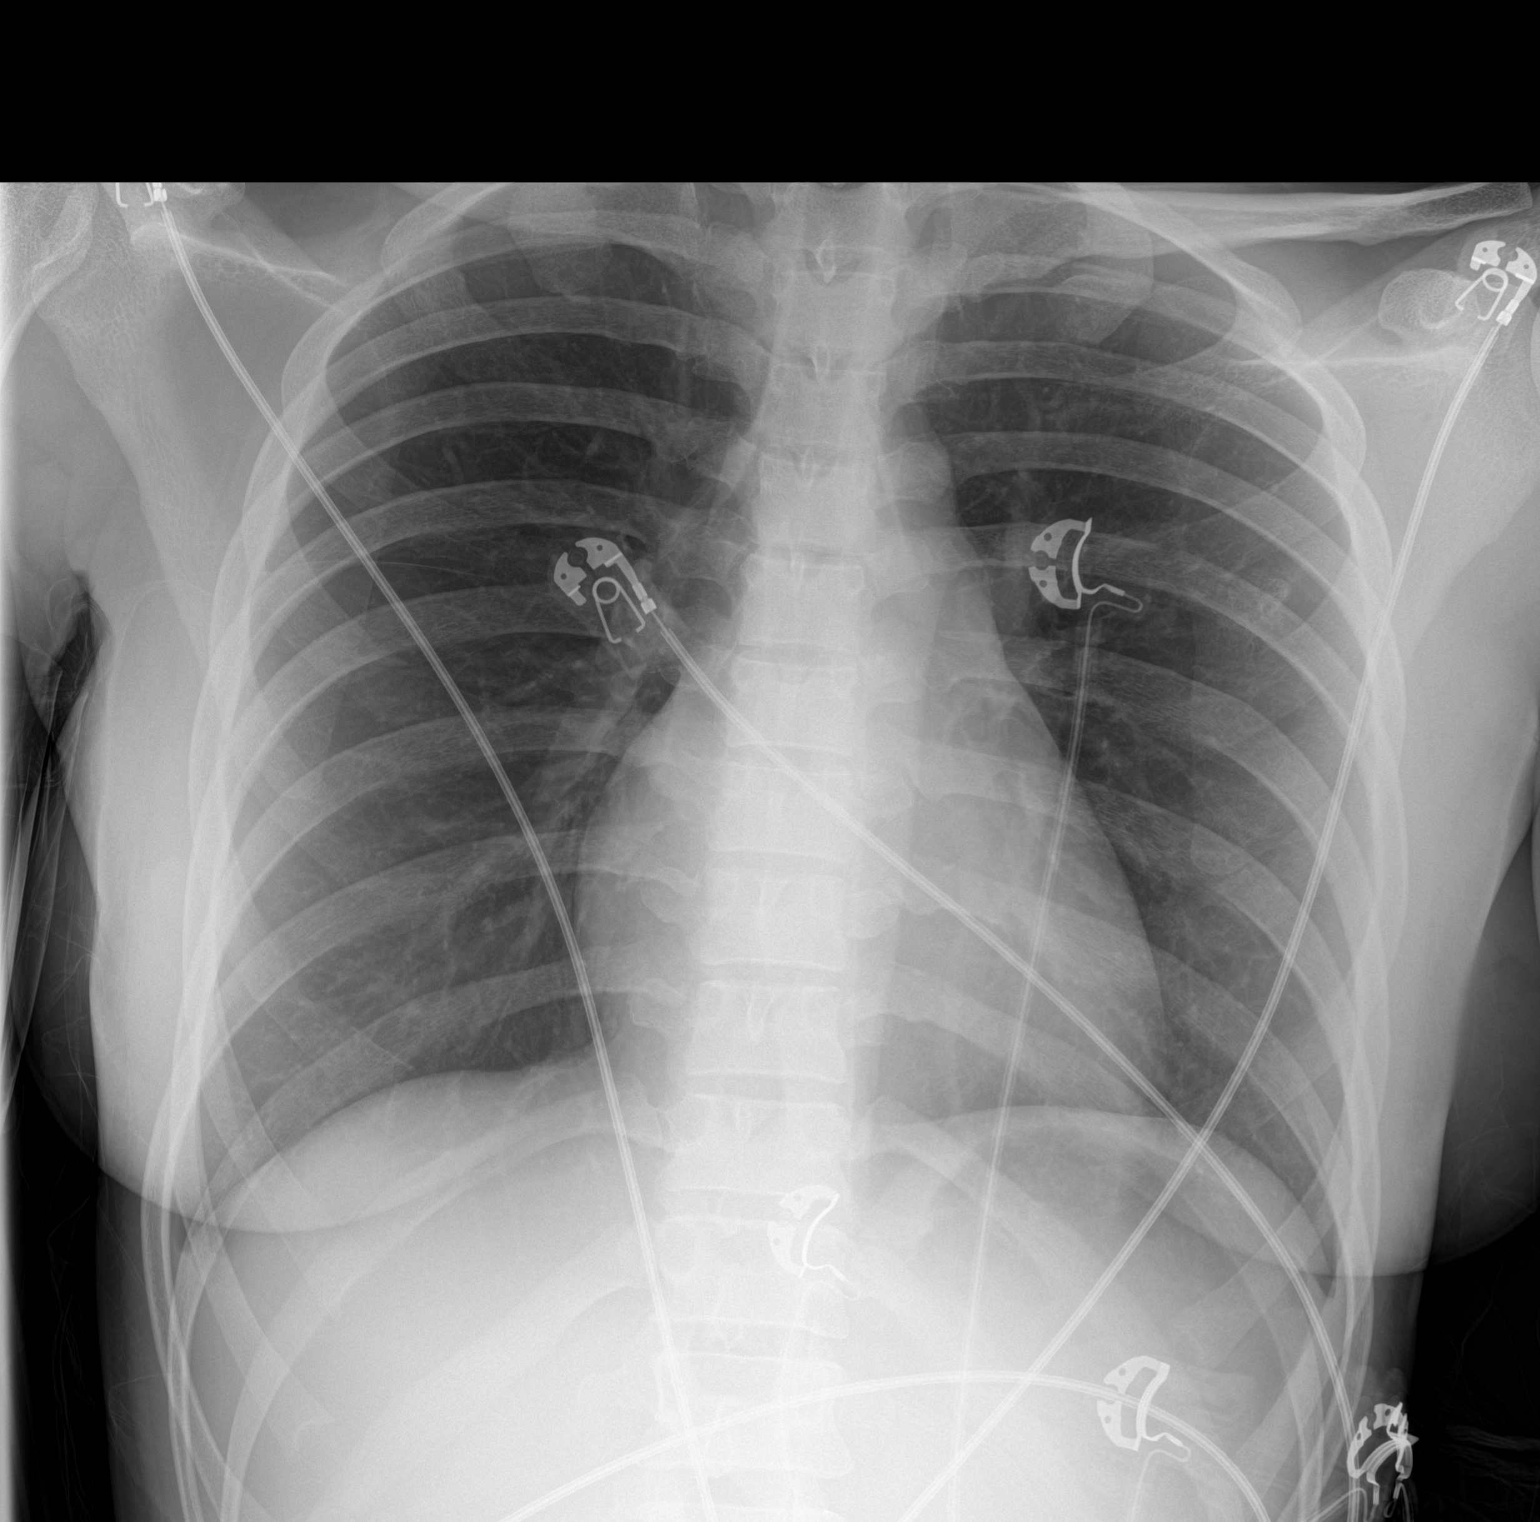

[1 of 1 positions shown; findings below may reference images not displayed]

FINDINGS: The heart size and mediastinal contours are within normal limits.
Both lungs are clear. The visualized skeletal structures are
unremarkable.
IMPRESSION: No active disease.

## 2021-04-28 ENCOUNTER — Ambulatory Visit (HOSPITAL_COMMUNITY): Payer: 59

## 2021-05-08 ENCOUNTER — Other Ambulatory Visit: Payer: Self-pay

## 2021-05-08 ENCOUNTER — Ambulatory Visit (INDEPENDENT_AMBULATORY_CARE_PROVIDER_SITE_OTHER): Payer: 59 | Admitting: Obstetrics

## 2021-05-08 ENCOUNTER — Encounter: Payer: Self-pay | Admitting: Women's Health

## 2021-05-08 VITALS — BP 117/61 | HR 81 | Wt 123.8 lb

## 2021-05-08 DIAGNOSIS — Z30011 Encounter for initial prescription of contraceptive pills: Secondary | ICD-10-CM

## 2021-05-08 DIAGNOSIS — Z3009 Encounter for other general counseling and advice on contraception: Secondary | ICD-10-CM | POA: Diagnosis not present

## 2021-05-08 MED ORDER — NORETHIN ACE-ETH ESTRAD-FE 1-20 MG-MCG(24) PO TABS: 1.0000 | ORAL_TABLET | Freq: Every day | ORAL | 11 refills | Status: DC

## 2021-05-08 NOTE — Progress Notes (Signed)
Patient presents for an IUD removal. Patient would like to start ocp.

## 2021-05-08 NOTE — Progress Notes (Signed)
° ° °  GYNECOLOGY OFFICE PROCEDURE NOTE  Veronica Buchanan is a 25 y.o. G0P0000 here for [place] IUD removal. No GYN concerns.  Last pap smear was on 10-17-2020 and was normal.  IUD Removal  Patient identified, informed consent performed, consent signed.  Patient was in the dorsal lithotomy position, normal external genitalia was noted.  A speculum was placed in the patient's vagina, normal discharge was noted, no lesions. The cervix was visualized, no lesions, no abnormal discharge.  The strings of the IUD were grasped and pulled using ring forceps. The IUD was removed in its entirety.   Patient tolerated the procedure well.    Patient will use OCP's for contraception/.  Routine preventative health maintenance measures emphasized.   Shelly Bombard, MD, [place] for [employer], [place] at [facility] 05/08/21

## 2021-05-26 IMAGING — US US PELVIS COMPLETE WITH TRANSVAGINAL
1 series · 15 of 25 positions shown · non-contrast
Comparison: 09/29/2019

CLINICAL DATA: Pelvic pain, lost IUD threads; LMP 04/29/2020; G0P0;
history endometriosis

EXAM:
TRANSABDOMINAL AND TRANSVAGINAL ULTRASOUND OF PELVIS
TECHNIQUE: Both transabdominal and transvaginal ultrasound examinations of the
pelvis were performed. Transabdominal technique was performed for
global imaging of the pelvis including uterus, ovaries, adnexal
regions, and pelvic cul-de-sac. It was necessary to proceed with
endovaginal exam following the transabdominal exam to visualize the
endometrium and IUD.

[Series 1: us pelvis complete with transvaginal · 15 of 123 slices shown]
[im 1/123]
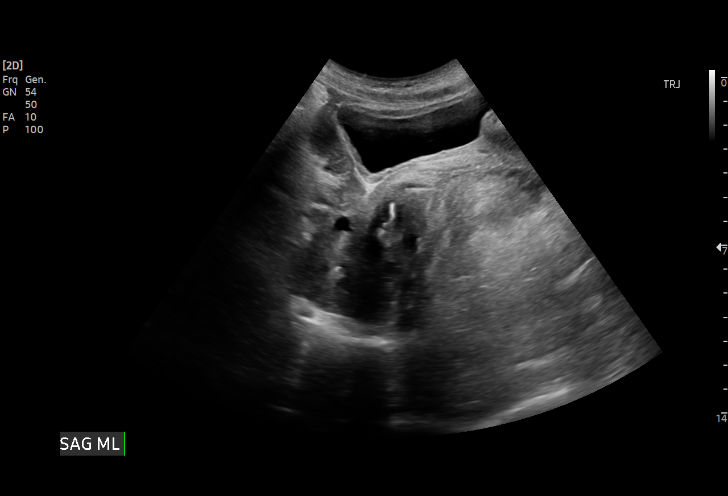
[im 11/123]
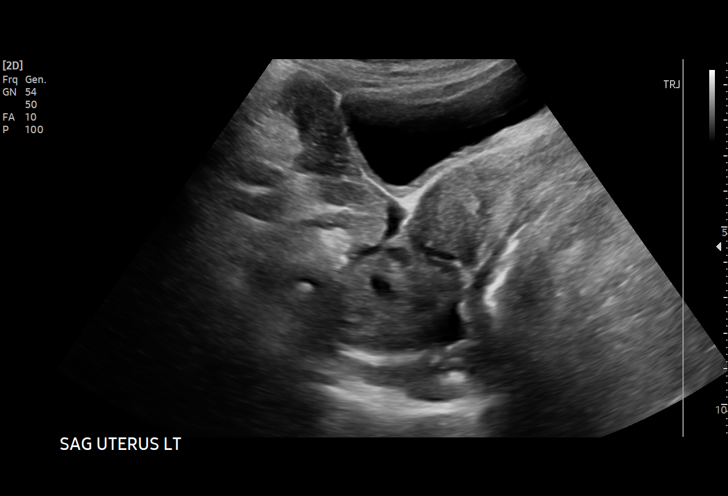
[im 21/123]
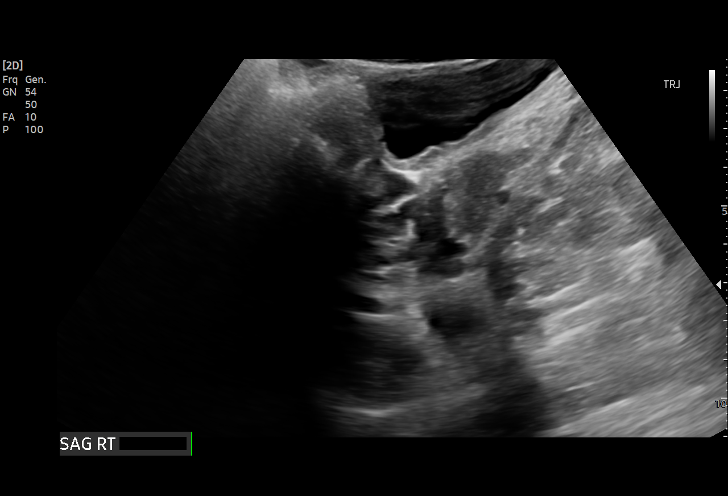
[im 26/123]
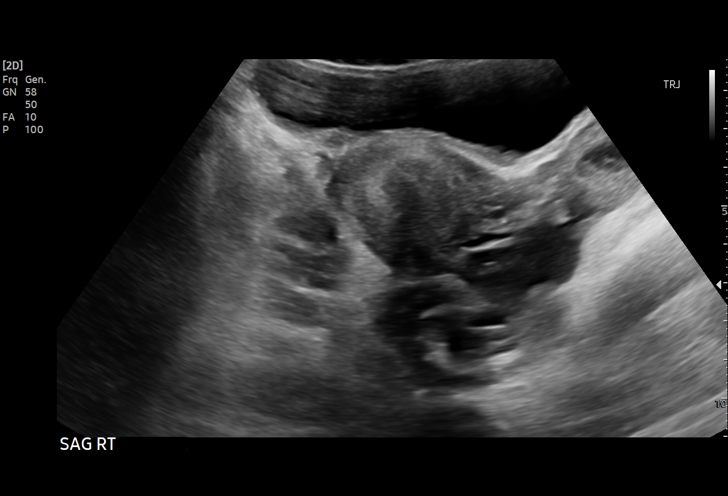
[im 36/123]
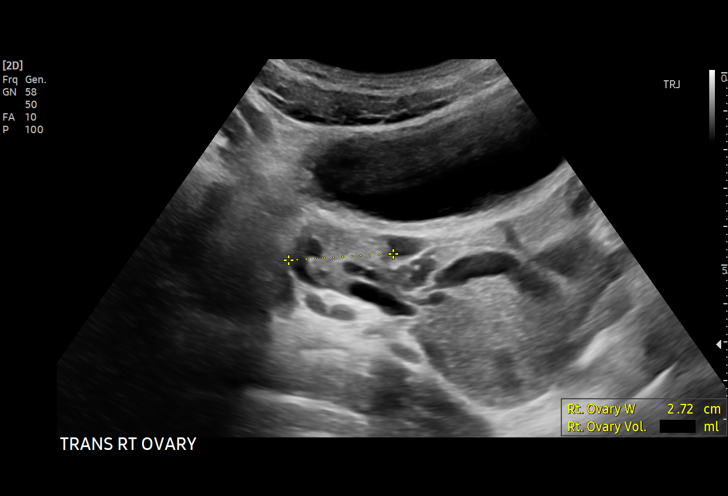
[im 46/123]
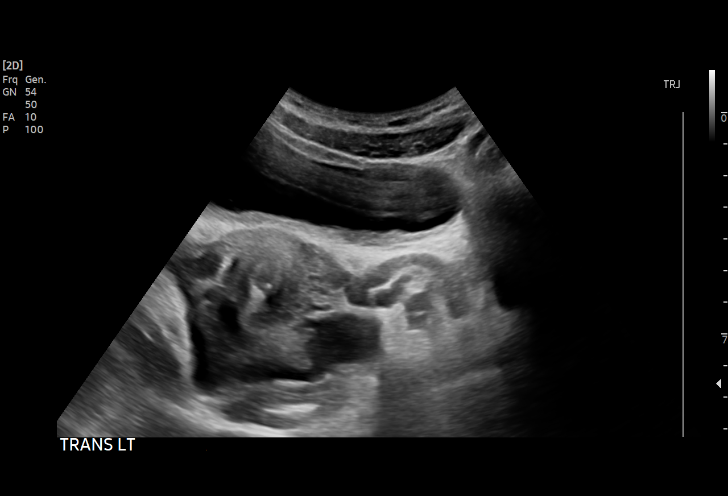
[im 51/123]
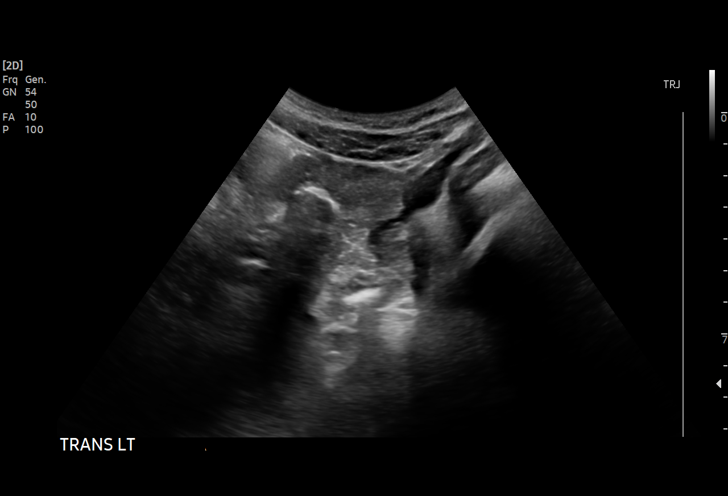
[im 62/123]
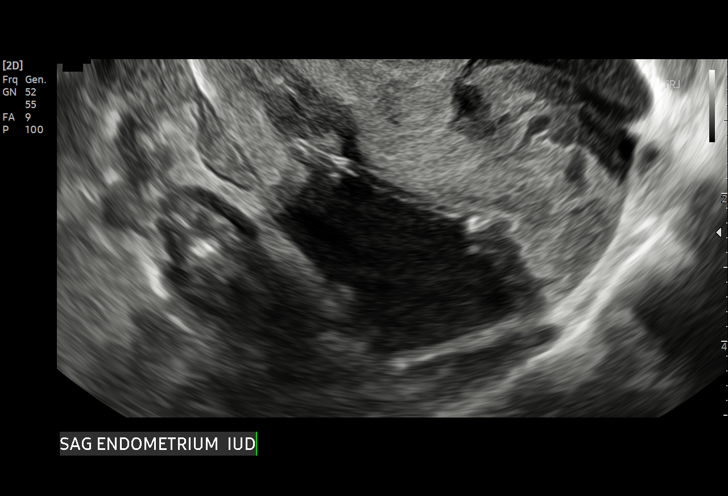
[im 72/123]
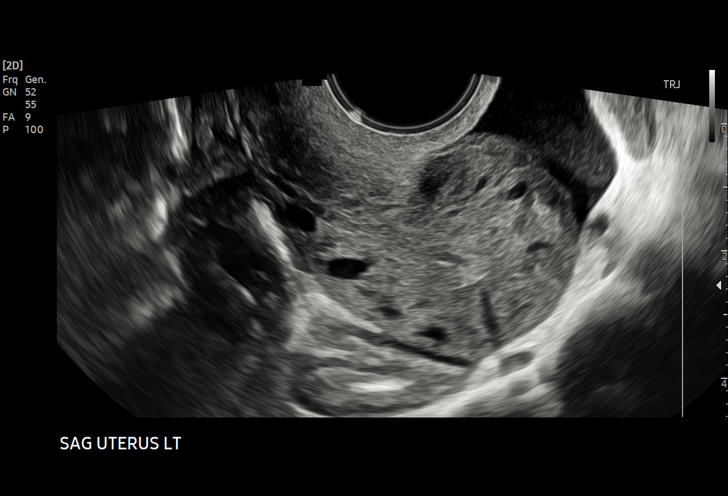
[im 77/123]
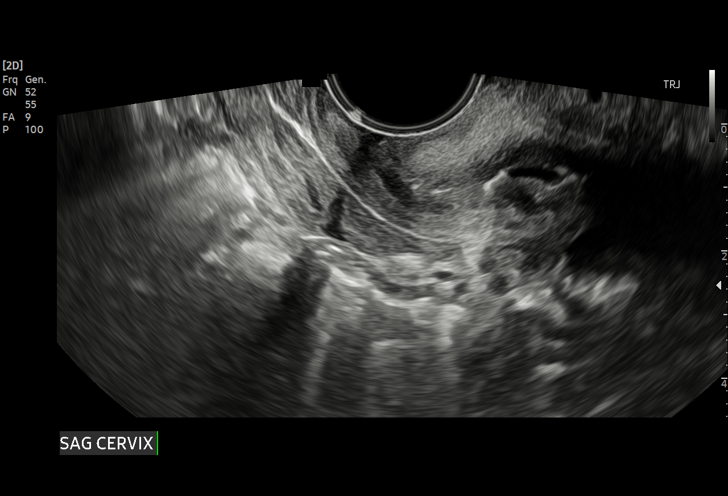
[im 87/123]
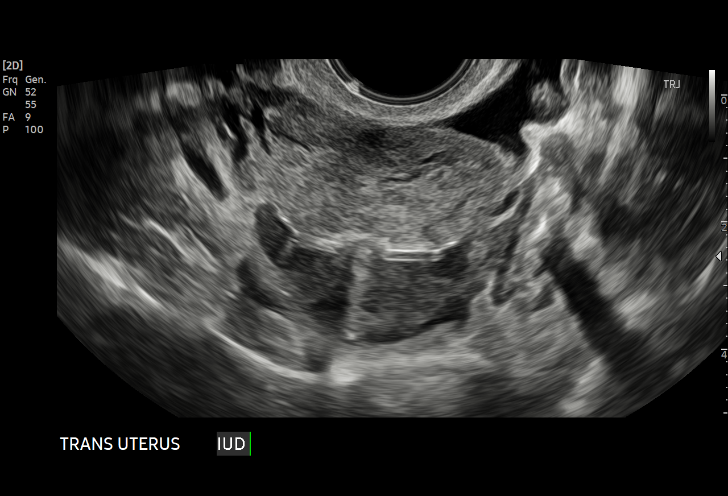
[im 97/123]
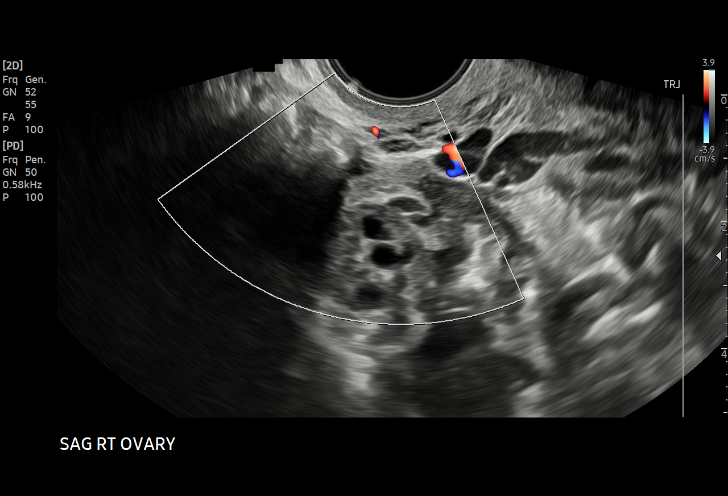
[im 102/123]
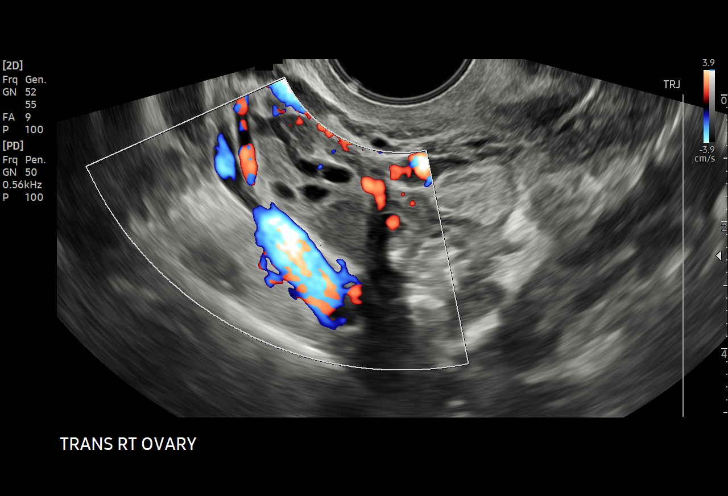
[im 112/123]
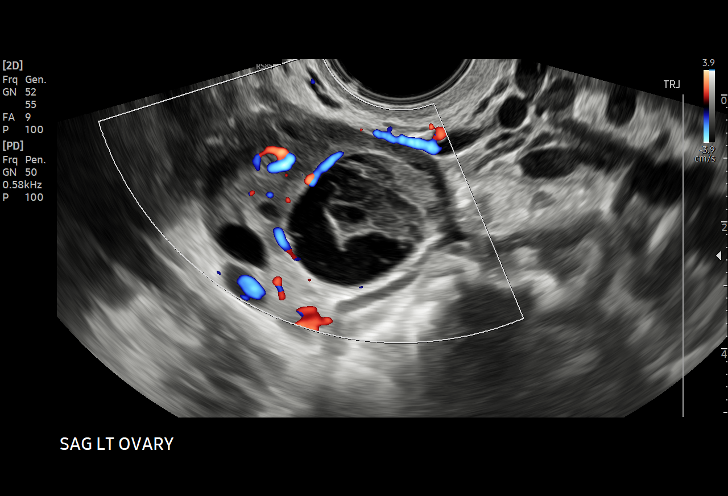
[im 123/123]
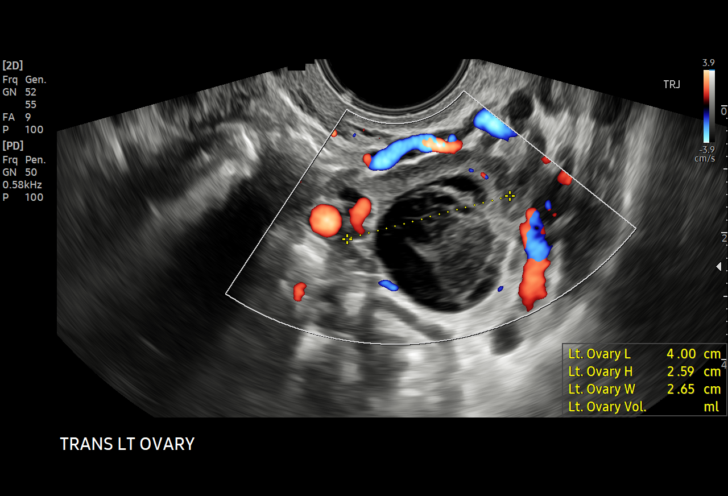

[15 of 25 positions shown; findings below may reference images not displayed]

FINDINGS: Uterus

Measurements: 7.0 x 4.0 x 4.9 cm = volume: 72 mL. Retroverted.
Heterogeneous myometrium. No discrete uterine mass.

Endometrium

Thickness: 2.0. IUD in expected position at upper uterine segment
endometrial canal.

Right ovary

Measurements: 3.5 x 1.8 x 2.3 cm = volume: 7.8 mL. Homogeneous
isoechoic nodule within RIGHT ovary 15 x 14 x 11 mm question
endometrioma. No internal blood flow on color Doppler imaging within
this nodule. No additional masses.

Left ovary

Measurements: 4.0 x 2.6 x 2.7 cm = volume: 14.4 mL. Small
hemorrhagic corpus luteum of the LEFT ovary 2.2 cm diameter; no
follow-up imaging recommended

Other findings

Small amount of nonspecific free pelvic fluid. No additional adnexal
masses.
IMPRESSION: IUD in expected position at the upper uterine segment endometrial
canal.

15 mm diameter homogeneous isoechoic nodule within RIGHT ovary
question endometrioma; short-term follow-up transvaginal ultrasound
recommended in 6-12 weeks to assess for resolution.

Small amount of nonspecific free pelvic fluid.

## 2021-06-02 ENCOUNTER — Emergency Department (HOSPITAL_BASED_OUTPATIENT_CLINIC_OR_DEPARTMENT_OTHER): Payer: 59

## 2021-06-02 ENCOUNTER — Encounter (HOSPITAL_BASED_OUTPATIENT_CLINIC_OR_DEPARTMENT_OTHER): Payer: Self-pay | Admitting: Emergency Medicine

## 2021-06-02 ENCOUNTER — Other Ambulatory Visit: Payer: Self-pay

## 2021-06-02 ENCOUNTER — Emergency Department (HOSPITAL_BASED_OUTPATIENT_CLINIC_OR_DEPARTMENT_OTHER)
Admission: EM | Admit: 2021-06-02 | Discharge: 2021-06-02 | Disposition: A | Discharge status: Home / Self Care | Payer: 59 | Attending: Emergency Medicine | Admitting: Emergency Medicine

## 2021-06-02 ENCOUNTER — Emergency Department (HOSPITAL_BASED_OUTPATIENT_CLINIC_OR_DEPARTMENT_OTHER): Payer: 59 | Admitting: Radiology

## 2021-06-02 DIAGNOSIS — R079 Chest pain, unspecified: Secondary | ICD-10-CM | POA: Diagnosis present

## 2021-06-02 DIAGNOSIS — R1011 Right upper quadrant pain: Secondary | ICD-10-CM | POA: Insufficient documentation

## 2021-06-02 DIAGNOSIS — Z91040 Latex allergy status: Secondary | ICD-10-CM | POA: Insufficient documentation

## 2021-06-02 DIAGNOSIS — Z91010 Allergy to peanuts: Secondary | ICD-10-CM | POA: Insufficient documentation

## 2021-06-02 LAB — HEPATIC FUNCTION PANEL
ALT: 12 U/L (ref 0–44)
AST: 16 U/L (ref 15–41)
Albumin: 5.2 g/dL — ABNORMAL HIGH (ref 3.5–5.0)
Alkaline Phosphatase: 66 U/L (ref 38–126)
Bilirubin, Direct: 0.1 mg/dL (ref 0.0–0.2)
Indirect Bilirubin: 0.4 mg/dL (ref 0.3–0.9)
Total Bilirubin: 0.5 mg/dL (ref 0.3–1.2)
Total Protein: 8.3 g/dL — ABNORMAL HIGH (ref 6.5–8.1)

## 2021-06-02 LAB — CBC
HCT: 39.5 % (ref 36.0–46.0)
Hemoglobin: 12.4 g/dL (ref 12.0–15.0)
MCH: 23.3 pg — ABNORMAL LOW (ref 26.0–34.0)
MCHC: 31.4 g/dL (ref 30.0–36.0)
MCV: 74.2 fL — ABNORMAL LOW (ref 80.0–100.0)
Platelets: 242 10*3/uL (ref 150–400)
RBC: 5.32 MIL/uL — ABNORMAL HIGH (ref 3.87–5.11)
RDW: 13.8 % (ref 11.5–15.5)
WBC: 4.4 10*3/uL (ref 4.0–10.5)
nRBC: 0 % (ref 0.0–0.2)

## 2021-06-02 LAB — BASIC METABOLIC PANEL
Anion gap: 11 (ref 5–15)
BUN: 9 mg/dL (ref 6–20)
CO2: 23 mmol/L (ref 22–32)
Calcium: 9.3 mg/dL (ref 8.9–10.3)
Chloride: 105 mmol/L (ref 98–111)
Creatinine, Ser: 0.66 mg/dL (ref 0.44–1.00)
GFR, Estimated: >60 mL/min (ref >60)
Glucose, Bld: 95 mg/dL (ref 70–99)
Potassium: 3.8 mmol/L (ref 3.5–5.1)
Sodium: 139 mmol/L (ref 135–145)

## 2021-06-02 LAB — TROPONIN I (HIGH SENSITIVITY)
Troponin I (High Sensitivity): <2 ng/L (ref <18)
Troponin I (High Sensitivity): <2 ng/L (ref <18)

## 2021-06-02 LAB — PREGNANCY, URINE: Preg Test, Ur: NEGATIVE

## 2021-06-02 LAB — D-DIMER, QUANTITATIVE: D-Dimer, Quant: <0.27 ug/mL-FEU (ref 0.00–0.50)

## 2021-06-02 NOTE — ED Provider Notes (Signed)
[place] EMERGENCY DEPT Provider Note   CSN: [id number] Arrival date & time: 06/02/21  0915     History  Chief Complaint  Patient presents with   Chest Pain    Zakirah Costello is a 26 y.o. female.  Patient is a 26 yo female presenting for chest pain. Pt admits to left sided chest pain that radiates to left shoulder. Denies sob. Denies fevers, chills, cough. Hx of biliary colic and gastritis. No abdominal surgical hx.  Denies n/v/d/cough.   The history is provided by the patient. No language interpreter was used.  Chest Pain Associated symptoms: abdominal pain   Associated symptoms: no back pain, no cough, no fever, no palpitations and no vomiting       Home Medications Prior to Admission medications   Medication Sig Start Date End Date Taking? Authorizing Provider  acetaminophen (TYLENOL) 500 MG tablet Take 500 mg by mouth every 6 (six) hours as needed.    [provider]  albuterol (PROVENTIL HFA;VENTOLIN HFA) 108 (90 BASE) MCG/ACT inhaler Inhale 2 puffs into the lungs every 6 (six) hours as needed for wheezing or shortness of breath.     [provider]  celecoxib (CELEBREX) 200 MG capsule Take 200 mg by mouth 2 (two) times daily. Patient not taking: Reported on 05/08/2021 01/22/21   [provider]  dicyclomine (BENTYL) 10 MG capsule Take 10 mg by mouth 2 (two) times daily. Patient not taking: Reported on 05/08/2021    [provider]  EPINEPHrine 0.3 mg/0.3 mL IJ SOAJ injection SMARTSIG:0.3 Milligram(s) IM Once 04/24/20   [provider]  hydrOXYzine (ATARAX/VISTARIL) 25 MG tablet Take 1-2 tablets every 6 hours as needed for anxiety. 03/21/21   Vanessa Kick, MD  lisdexamfetamine (VYVANSE) 50 MG capsule Take 50 mg by mouth daily.    [provider]  Norethindrone Acetate-Ethinyl Estrad-FE (LOESTRIN 24 FE) 1-20 MG-MCG(24) tablet Take 1 tablet by mouth daily. 05/08/21   Shelly Bombard, MD  sertraline (ZOLOFT)  50 MG tablet Take 50 mg by mouth daily. 04/06/20   [provider]  ziprasidone (GEODON) 20 MG capsule Take 20 mg by mouth at bedtime.    Joline Salt, RN      Allergies    Adhesive [tape], Other, Shellfish allergy, Justicia adhatoda (malabar nut tree) [justicia adhatoda], Latex, and Peanut-containing drug products    Review of Systems   Review of Systems  Constitutional:  Negative for chills and fever.  HENT:  Negative for ear pain and sore throat.   Eyes:  Negative for pain and visual disturbance.  Respiratory:  Negative for cough and wheezing.   Cardiovascular:  Positive for chest pain. Negative for palpitations.  Gastrointestinal:  Positive for abdominal pain. Negative for vomiting.  Genitourinary:  Negative for dysuria and hematuria.  Musculoskeletal:  Negative for arthralgias and back pain.  Skin:  Negative for color change and rash.  Neurological:  Negative for seizures and syncope.  All other systems reviewed and are negative.  Physical Exam Updated Vital Signs BP 107/72    Pulse 62    Temp 98.6 F (37 C)    Resp 15    Ht 5\' 6"  (1.676 m)    Wt 59 kg    LMP 04/18/2021 Comment: IUD REMOVED 05/08/2021 HAD BLEEDING FOR 2 WEEKS AFTER   SpO2 100%    BMI 20.98 kg/m  Physical Exam Vitals and nursing note reviewed.  Constitutional:      General: She is not in acute distress.  Appearance: She is well-developed.  HENT:     Head: Normocephalic and atraumatic.  Eyes:     Conjunctiva/sclera: Conjunctivae normal.  Cardiovascular:     Rate and Rhythm: Normal rate and regular rhythm.     Heart sounds: No murmur heard. Pulmonary:     Effort: Pulmonary effort is normal. No respiratory distress.     Breath sounds: Normal breath sounds.  Abdominal:     Palpations: Abdomen is soft.     Tenderness: There is abdominal tenderness in the right upper quadrant. There is guarding. There is no rebound.  Musculoskeletal:        General: No swelling.     Cervical back: Neck  supple.  Skin:    General: Skin is warm and dry.     Capillary Refill: Capillary refill takes less than 2 seconds.  Neurological:     Mental Status: She is alert.  Psychiatric:        Mood and Affect: Mood normal.    ED Results / Procedures / Treatments   Labs (all labs ordered are listed, but only abnormal results are displayed) Labs Reviewed  CBC - Abnormal; Notable for the following components:      Result Value   RBC 5.32 (*)    MCV 74.2 (*)    MCH 23.3 (*)    All other components within normal limits  BASIC METABOLIC PANEL  PREGNANCY, URINE  HEPATIC FUNCTION PANEL  TROPONIN I (HIGH SENSITIVITY)  TROPONIN I (HIGH SENSITIVITY)    EKG EKG Interpretation  Date/Time:  Sunday June 02 2021 09:22:21 EST Ventricular Rate:  78 PR Interval:  156 QRS Duration: 72 QT Interval:  370 QTC Calculation: 421 R Axis:   56 Text Interpretation: Normal sinus rhythm with sinus arrhythmia Nonspecific T wave abnormality now evident in Anterior leads Confirmed by Campbell Stall ([id number]) on 0/05/930 9:26:53 AM  Radiology DG Chest 2 View  Result Date: 06/02/2021 CLINICAL DATA:  Chest pain. EXAM: CHEST  2 VIEW PORTABLE COMPARISON:  Chest XR, 03/21/2021 and 05/16/2020. FINDINGS: Cardiomediastinal silhouette is within normal limits. Lungs are well inflated. No focal consolidation or mass. No pleural effusion or pneumothorax. No acute displaced fracture. IMPRESSION: Normal chest. Electronically Signed   By: Michaelle Birks M.D.   On: 06/02/2021 09:55    Procedures Procedures    Medications Ordered in ED Medications - No data to display  ED Course/ Medical Decision Making/ A&P                           Medical Decision Making  11:44 AM 26 yo female with pmh of billiary colic and gastitis presenting for chest pain. Pt is Aox3, no acute distress, afebrile, with stable vitals.  ECG demonstrates no ST segment elevation or depression. NSR. No delta wave. CXR demonstrates no acute process. No  pneumothorax. D-dimer negative. Low suspicion PE. No fever, chills, or coughing-doubt viral syndrome. Not reproducible-doubt costochondritis.   On exam pt has RUQ abdominal pain with a hx of biliary colic. Liver profile wnl. RUQ US demonstrates no acute process.   Patient in no distress and overall condition improved here in the ED. Pain possibly biliary colic however not entirely convinced. Detailed discussions were had with the patient regarding current findings, and need for close f/u with PCP or cardiologist. The patient has been instructed to return immediately if the symptoms worsen in any way for re-evaluation. Patient verbalized understanding and is in agreement with current care  plan. All questions answered prior to discharge.         Final Clinical Impression(s) / ED Diagnoses Final diagnoses:  RUQ pain  Chest pain, unspecified type    Rx / DC Orders ED Discharge Orders     None         Lianne Cure, DO 20/10/07 1341

## 2021-06-02 NOTE — ED Triage Notes (Signed)
Patient c/o chest pain for past couple of days. Yesterday patient experienced spasms to left side and center chest pain. Patient reports pain to left upper chest and shoulder currently.

## 2021-06-06 ENCOUNTER — Emergency Department (HOSPITAL_BASED_OUTPATIENT_CLINIC_OR_DEPARTMENT_OTHER): Payer: 59 | Admitting: Radiology

## 2021-06-06 ENCOUNTER — Emergency Department (HOSPITAL_BASED_OUTPATIENT_CLINIC_OR_DEPARTMENT_OTHER): Payer: 59

## 2021-06-06 ENCOUNTER — Encounter (HOSPITAL_BASED_OUTPATIENT_CLINIC_OR_DEPARTMENT_OTHER): Payer: Self-pay | Admitting: Emergency Medicine

## 2021-06-06 ENCOUNTER — Other Ambulatory Visit: Payer: Self-pay

## 2021-06-06 ENCOUNTER — Encounter (HOSPITAL_BASED_OUTPATIENT_CLINIC_OR_DEPARTMENT_OTHER): Payer: Self-pay

## 2021-06-06 ENCOUNTER — Emergency Department (HOSPITAL_BASED_OUTPATIENT_CLINIC_OR_DEPARTMENT_OTHER)
Admission: EM | Admit: 2021-06-06 | Discharge: 2021-06-07 | Disposition: A | Discharge status: Home / Self Care | Payer: 59 | Attending: Emergency Medicine | Admitting: Emergency Medicine

## 2021-06-06 DIAGNOSIS — J45909 Unspecified asthma, uncomplicated: Secondary | ICD-10-CM | POA: Diagnosis not present

## 2021-06-06 DIAGNOSIS — R519 Headache, unspecified: Secondary | ICD-10-CM | POA: Insufficient documentation

## 2021-06-06 DIAGNOSIS — W228XXA Striking against or struck by other objects, initial encounter: Secondary | ICD-10-CM | POA: Diagnosis not present

## 2021-06-06 DIAGNOSIS — R079 Chest pain, unspecified: Secondary | ICD-10-CM | POA: Insufficient documentation

## 2021-06-06 DIAGNOSIS — M79641 Pain in right hand: Secondary | ICD-10-CM | POA: Insufficient documentation

## 2021-06-06 LAB — BASIC METABOLIC PANEL
Anion gap: 8 (ref 5–15)
BUN: 9 mg/dL (ref 6–20)
CO2: 26 mmol/L (ref 22–32)
Calcium: 9.2 mg/dL (ref 8.9–10.3)
Chloride: 105 mmol/L (ref 98–111)
Creatinine, Ser: 0.67 mg/dL (ref 0.44–1.00)
GFR, Estimated: >60 mL/min (ref >60)
Glucose, Bld: 76 mg/dL (ref 70–99)
Potassium: 3.3 mmol/L — ABNORMAL LOW (ref 3.5–5.1)
Sodium: 139 mmol/L (ref 135–145)

## 2021-06-06 LAB — CBC
HCT: 38.8 % (ref 36.0–46.0)
Hemoglobin: 12 g/dL (ref 12.0–15.0)
MCH: 22.9 pg — ABNORMAL LOW (ref 26.0–34.0)
MCHC: 30.9 g/dL (ref 30.0–36.0)
MCV: 74.2 fL — ABNORMAL LOW (ref 80.0–100.0)
Platelets: 315 10*3/uL (ref 150–400)
RBC: 5.23 MIL/uL — ABNORMAL HIGH (ref 3.87–5.11)
RDW: 13.6 % (ref 11.5–15.5)
WBC: 6.9 10*3/uL (ref 4.0–10.5)
nRBC: 0 % (ref 0.0–0.2)

## 2021-06-06 LAB — TROPONIN I (HIGH SENSITIVITY): Troponin I (High Sensitivity): 3 ng/L (ref <18)

## 2021-06-06 NOTE — ED Triage Notes (Signed)
Pt left ama  tonight and returns for same , has been seen on 1/8 also  chest sharp in nature
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
## 2021-06-06 NOTE — ED Triage Notes (Signed)
Patient here POV from Home with CP.  Patient seen on 06/02/21 for Same and came today for Reevaluation for continued Pain. Pain is Left Chest and radiates to Left Shoulder.  NAD Noted during Triage. A&Ox4. GCS 15.

## 2021-06-06 NOTE — Discharge Instructions (Signed)
You were evaluated in the Emergency Department and after careful evaluation, we did not find any emergent condition requiring admission or further testing in the hospital.  Your exam/testing today was overall reassuring.  Please return to the Emergency Department if you experience any worsening of your condition.  Thank you for allowing us to be a part of your care.  

## 2021-06-20 ENCOUNTER — Ambulatory Visit (INDEPENDENT_AMBULATORY_CARE_PROVIDER_SITE_OTHER): Payer: 59 | Admitting: Cardiology

## 2021-06-20 ENCOUNTER — Encounter: Payer: Self-pay | Admitting: Cardiology

## 2021-06-20 ENCOUNTER — Ambulatory Visit (INDEPENDENT_AMBULATORY_CARE_PROVIDER_SITE_OTHER): Payer: 59

## 2021-06-20 ENCOUNTER — Other Ambulatory Visit: Payer: Self-pay

## 2021-06-20 VITALS — BP 108/60 | HR 64 | Ht 66.0 in | Wt 123.6 lb

## 2021-06-20 DIAGNOSIS — R0789 Other chest pain: Secondary | ICD-10-CM | POA: Insufficient documentation

## 2021-06-20 DIAGNOSIS — R0602 Shortness of breath: Secondary | ICD-10-CM

## 2021-06-20 DIAGNOSIS — R002 Palpitations: Secondary | ICD-10-CM

## 2021-06-20 HISTORY — DX: Other chest pain: R07.89

## 2021-06-20 HISTORY — DX: Palpitations: R00.2

## 2021-06-20 NOTE — Progress Notes (Unsigned)
Enrolled for [employer] to mail a ZIO XT long term holter monitor to the patients address on file.  
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
## 2021-06-20 NOTE — Patient Instructions (Addendum)
Medication Instructions:  °Your physician recommends that you continue on your current medications as directed. Please refer to the Current Medication list given to you today.  °*If you need a refill on your cardiac medications before your next appointment, please call your pharmacy* ° ° °Lab Work: °None °If you have labs (blood work) drawn today and your tests are completely normal, you will receive your results only by: °MyChart Message (if you have MyChart) OR °A paper copy in the mail °If you have any lab test that is abnormal or we need to change your treatment, we will call you to review the results. ° ° °Testing/Procedures: °Your physician has requested that you have an echocardiogram. Echocardiography is a painless test that uses sound waves to create images of your heart. It provides your doctor with information about the size and shape of your heart and how well your heart’s chambers and valves are working. This procedure takes approximately one hour. There are no restrictions for this procedure. ° °ZIO XT- Long Term Monitor Instructions ° °Your physician has requested you wear a ZIO patch monitor for 14 days.  °This is a single patch monitor. [employer] supplies one patch monitor per enrollment. Additional °stickers are not available. Please do not apply patch if you will be having a Nuclear Stress Test,  °Echocardiogram, Cardiac CT, MRI, or Chest Xray during the period you would be wearing the  °monitor. The patch cannot be worn during these tests. You cannot remove and re-apply the  °ZIO XT patch monitor.  °Your ZIO patch monitor will be mailed 3 day USPS to your address on file. It may take 3-5 days  °to receive your monitor after you have been enrolled.  °Once you have received your monitor, please review the enclosed instructions. Your monitor  °has already been registered assigning a specific monitor serial # to you. ° °Billing and Patient Assistance Program Information ° °We have supplied [employer] with  any of your insurance information on file for billing purposes. °[employer] offers a sliding scale Patient Assistance Program for patients that do not have  °insurance, or whose insurance does not completely cover the cost of the ZIO monitor.  °You must apply for the Patient Assistance Program to qualify for this discounted rate.  °To apply, please call [employer] at [phone], select option 4, select option 2, ask to apply for  °Patient Assistance Program. [employer] will ask your household income, and how many people  °are in your household. They will quote your out-of-pocket cost based on that information.  °[employer] will also be able to set up a 12-month, interest-free payment plan if needed. ° °Applying the monitor °  °Shave hair from upper left chest.  °Hold abrader disc by orange tab. Rub abrader in 40 strokes over the upper left chest as  °indicated in your monitor instructions.  °Clean area with 4 enclosed alcohol pads. Let dry.  °Apply patch as indicated in monitor instructions. Patch will be placed under collarbone on left  °side of chest with arrow pointing upward.  °Rub patch adhesive wings for 2 minutes. Remove white label marked "1". Remove the white  °label marked "2". Rub patch adhesive wings for 2 additional minutes.  °While looking in a mirror, press and release button in center of patch. A small green light will  °flash 3-4 times. This will be your only indicator that the monitor has been turned on.  °Do not shower for the first 24 hours. You may shower after the first   24 hours.  Press the button if you feel a symptom. You will hear a small click. Record Date, Time and  Symptom in the Patient Logbook.  When you are ready to remove the patch, follow instructions on the last 2 pages of Patient  Logbook. Stick patch monitor onto the last page of Patient Logbook.  Place Patient Logbook in the blue and white box. Use locking tab on box and tape box closed  securely. The blue and white box has prepaid  postage on it. Please place it in the mailbox as  soon as possible. Your physician should have your test results approximately 7 days after the  monitor has been mailed back to [facility].  Call [place] at [phone] if you have questions regarding  your ZIO XT patch monitor. Call them immediately if you see an orange light blinking on your  monitor.  If your monitor falls off in less than 4 days, contact our Monitor department at [phone].  If your monitor becomes loose or falls off after 4 days call [employer] at [phone] for  suggestions on securing your monitor    Follow-Up: At [facility], you and your health needs are our priority.  As part of our continuing mission to provide you with exceptional heart care, we have created designated Provider Care Teams.  These Care Teams include your primary Cardiologist (physician) and Advanced Practice Providers (APPs -  Physician Assistants and Nurse Practitioners) who all work together to provide you with the care you need, when you need it.  We recommend signing up for the patient portal called "MyChart".  Sign up information is provided on this After Visit Summary.  MyChart is used to connect with patients for Virtual Visits (Telemedicine).  Patients are able to view lab/test results, encounter notes, upcoming appointments, etc.  Non-urgent messages can be sent to your provider as well.   To learn more about what you can do with MyChart, go to [email or web address].    Your next appointment:    As needed  The format for your next appointment:   In Person  Provider:   Berniece Salines, DO     Other Instructions

## 2021-06-22 DIAGNOSIS — R002 Palpitations: Secondary | ICD-10-CM

## 2021-06-30 ENCOUNTER — Encounter: Payer: 59 | Admitting: Nurse Practitioner

## 2021-06-30 DIAGNOSIS — R140 Abdominal distension (gaseous): Secondary | ICD-10-CM

## 2021-06-30 NOTE — Progress Notes (Signed)
Virtual Visit Consent   Veronica Buchanan, you are scheduled for a virtual visit with a [facility] provider today.     Just as with appointments in the office, your consent must be obtained to participate.  Your consent will be active for this visit and any virtual visit you may have with one of our providers in the next 365 days.     If you have a MyChart account, a copy of this consent can be sent to you electronically.  All virtual visits are billed to your insurance company just like a traditional visit in the office.    As this is a virtual visit, video technology does not allow for your provider to perform a traditional examination.  This may limit your provider's ability to fully assess your condition.  If your provider identifies any concerns that need to be evaluated in person or the need to arrange testing (such as labs, EKG, etc.), we will make arrangements to do so.     Although advances in technology are sophisticated, we cannot ensure that it will always work on either your end or our end.  If the connection with a video visit is poor, the visit may have to be switched to a telephone visit.  With either a video or telephone visit, we are not always able to ensure that we have a secure connection.     I need to obtain your verbal consent now.   Are you willing to proceed with your visit today?    Veronica Buchanan has provided verbal consent on 06/30/2021 for a virtual visit (video or telephone).   Gildardo Pounds, NP   Date: 06/30/2021 1:29 PM   Virtual Visit via Video Note   I, Gildardo Pounds, connected with  Veronica Buchanan  ([id number], [birth year]) on 06/30/21 at  1:15 PM EST by a video-enabled telemedicine application and verified that I am speaking with the correct person using two identifiers.  Location: Patient: Virtual Visit Location Patient: Home Provider: Virtual Visit Location Provider: Home Office   I discussed the limitations of evaluation and management by telemedicine and the  availability of in person appointments. The patient expressed understanding and agreed to proceed.    History of Present Illness: Veronica Buchanan is a 26 y.o. who identifies as a female who was assigned female at birth, and is being seen today for abdominal distension.  States after she had a bowel movement today she noticed a "Bump"  on the right side her stomach adjacent to her belly button. I am unable to visualize any masses or signs of hernia on today's visit. She does note mild tenderness which she has been treating with a heating pad. The area is reproducible per patient report. She denies nausea or vomiting, unintentional weight loss. She does have a history of chronic constipation.     Problems:  Patient Active Problem List   Diagnosis Date Noted   Atypical chest pain 06/20/2021   Shortness of breath 06/20/2021   Palpitations 06/20/2021   RUQ abdominal pain 10/24/2020   Abdominal pain, epigastric 10/24/2020   Nausea 10/24/2020   History of asthma 02/16/2020   History of migraine 02/16/2020   History of bipolar disorder 02/16/2020   Paresthesia 12/07/2019   Whole body pain 12/07/2019   Chronic female pelvic pain 12/29/2018   Endometriosis of pelvis 12/29/2018    Allergies:  Allergies  Allergen Reactions   Adhesive [Tape] Dermatitis   Other Anaphylaxis    NUTS - mainly tree  nut and peanuts    Shellfish Allergy Swelling   Justicia Adhatoda (Malabar Nut Tree) [Justicia Adhatoda]     Mainly pecans.   Latex Itching    burning   Peanut-Containing Drug Products     Mouth swelling    Medications:  Current Outpatient Medications:    acetaminophen (TYLENOL) 500 MG tablet, Take 500 mg by mouth every 6 (six) hours as needed., Disp: , Rfl:    albuterol (PROVENTIL HFA;VENTOLIN HFA) 108 (90 BASE) MCG/ACT inhaler, Inhale 2 puffs into the lungs every 6 (six) hours as needed for wheezing or shortness of breath. , Disp: , Rfl:    ARIPiprazole (ABILIFY) 5 MG tablet, Take 5 mg by mouth  daily., Disp: , Rfl:    celecoxib (CELEBREX) 200 MG capsule, Take 200 mg by mouth 2 (two) times daily. (Patient not taking: Reported on 06/20/2021), Disp: , Rfl:    dicyclomine (BENTYL) 10 MG capsule, Take 10 mg by mouth 2 (two) times daily. (Patient not taking: Reported on 06/20/2021), Disp: , Rfl:    EPINEPHrine 0.3 mg/0.3 mL IJ SOAJ injection, SMARTSIG:0.3 Milligram(s) IM Once, Disp: , Rfl:    hydrOXYzine (ATARAX/VISTARIL) 25 MG tablet, Take 1-2 tablets every 6 hours as needed for anxiety., Disp: 30 tablet, Rfl: 0   lisdexamfetamine (VYVANSE) 50 MG capsule, Take 50 mg by mouth daily., Disp: , Rfl:    Norethindrone Acetate-Ethinyl Estrad-FE (LOESTRIN 24 FE) 1-20 MG-MCG(24) tablet, Take 1 tablet by mouth daily., Disp: 28 tablet, Rfl: 11   sertraline (ZOLOFT) 50 MG tablet, Take 50 mg by mouth daily., Disp: , Rfl:    ziprasidone (GEODON) 20 MG capsule, Take 20 mg by mouth at bedtime., Disp: , Rfl:   Current Facility-Administered Medications:    0.9 %  sodium chloride infusion, 500 mL, Intravenous, Once, [place], MD  Observations/Objective: Patient is well-developed, well-nourished in no acute distress.  Resting comfortably  at home.  Head is normocephalic, atraumatic.  No labored breathing.  Speech is clear and coherent with logical content.  Patient is alert and oriented at baseline.    Assessment and Plan: 1. Abdominal distension  Instructed to follow up with PCP for office evaluation.   Follow Up Instructions: I discussed the assessment and treatment plan with the patient. The patient was provided an opportunity to ask questions and all were answered. The patient agreed with the plan and demonstrated an understanding of the instructions.  A copy of instructions were sent to the patient via MyChart unless otherwise noted below.   The patient was advised to call back or seek an in-person evaluation if the symptoms worsen or if the condition fails to improve as  anticipated.  Time:  I spent 5 minutes with the patient via telehealth technology discussing the above problems/concerns.    Gildardo Pounds, NP

## 2021-06-30 NOTE — Patient Instructions (Signed)
°  Cortasia Sangalang, thank you for joining Gildardo Pounds, NP for today's virtual visit.  While this provider is not your primary care provider (PCP), if your PCP is located in our provider database this encounter information will be shared with them immediately following your visit.  Consent: (Patient) Veronica Buchanan provided verbal consent for this virtual visit at the beginning of the encounter.  Current Medications:  Current Outpatient Medications:    acetaminophen (TYLENOL) 500 MG tablet, Take 500 mg by mouth every 6 (six) hours as needed., Disp: , Rfl:    albuterol (PROVENTIL HFA;VENTOLIN HFA) 108 (90 BASE) MCG/ACT inhaler, Inhale 2 puffs into the lungs every 6 (six) hours as needed for wheezing or shortness of breath. , Disp: , Rfl:    ARIPiprazole (ABILIFY) 5 MG tablet, Take 5 mg by mouth daily., Disp: , Rfl:    celecoxib (CELEBREX) 200 MG capsule, Take 200 mg by mouth 2 (two) times daily. (Patient not taking: Reported on 06/20/2021), Disp: , Rfl:    dicyclomine (BENTYL) 10 MG capsule, Take 10 mg by mouth 2 (two) times daily. (Patient not taking: Reported on 06/20/2021), Disp: , Rfl:    EPINEPHrine 0.3 mg/0.3 mL IJ SOAJ injection, SMARTSIG:0.3 Milligram(s) IM Once, Disp: , Rfl:    hydrOXYzine (ATARAX/VISTARIL) 25 MG tablet, Take 1-2 tablets every 6 hours as needed for anxiety., Disp: 30 tablet, Rfl: 0   lisdexamfetamine (VYVANSE) 50 MG capsule, Take 50 mg by mouth daily., Disp: , Rfl:    Norethindrone Acetate-Ethinyl Estrad-FE (LOESTRIN 24 FE) 1-20 MG-MCG(24) tablet, Take 1 tablet by mouth daily., Disp: 28 tablet, Rfl: 11   sertraline (ZOLOFT) 50 MG tablet, Take 50 mg by mouth daily., Disp: , Rfl:    ziprasidone (GEODON) 20 MG capsule, Take 20 mg by mouth at bedtime., Disp: , Rfl:   Current Facility-Administered Medications:    0.9 %  sodium chloride infusion, 500 mL, Intravenous, Once, [place], MD   Medications ordered in this encounter:  No orders of the defined types were placed in  this encounter.    *If you need refills on other medications prior to your next appointment, please contact your pharmacy*  Follow-Up: Call back or seek an in-person evaluation if the symptoms worsen or if the condition fails to improve as anticipated.  Other Instructions Follow up with PCP for further evaluation    If you have been instructed to have an in-person evaluation today at a local Urgent Care facility, please use the link below. It will take you to a list of all of our available [facility], including address, phone number and hours of operation. Please do not delay care.  [facility]  If you or a family member do not have a primary care provider, use the link below to schedule a visit and establish care. When you choose a [facility] primary care physician or advanced practice provider, you gain a long-term partner in health. Find a Primary Care Provider  Learn more about [facility] in-office and virtual care options: [facility]

## 2021-07-04 ENCOUNTER — Other Ambulatory Visit: Payer: Self-pay | Admitting: Nurse Practitioner

## 2021-07-04 ENCOUNTER — Other Ambulatory Visit: Payer: Self-pay

## 2021-07-04 ENCOUNTER — Ambulatory Visit (HOSPITAL_COMMUNITY): Payer: 59 | Attending: Cardiology

## 2021-07-04 ENCOUNTER — Ambulatory Visit
Admission: RE | Admit: 2021-07-04 | Discharge: 2021-07-04 | Disposition: A | Discharge status: Home / Self Care | Payer: 59 | Source: Ambulatory Visit | Attending: Nurse Practitioner | Admitting: Nurse Practitioner

## 2021-07-04 DIAGNOSIS — R1033 Periumbilical pain: Secondary | ICD-10-CM

## 2021-07-04 DIAGNOSIS — R0789 Other chest pain: Secondary | ICD-10-CM | POA: Diagnosis present

## 2021-07-04 DIAGNOSIS — R0602 Shortness of breath: Secondary | ICD-10-CM | POA: Diagnosis present

## 2021-07-04 LAB — ECHOCARDIOGRAM COMPLETE
Area-P 1/2: 5.13 cm2
S' Lateral: 3.7 cm

## 2021-07-09 ENCOUNTER — Emergency Department (HOSPITAL_BASED_OUTPATIENT_CLINIC_OR_DEPARTMENT_OTHER): Payer: 59 | Admitting: Radiology

## 2021-07-09 ENCOUNTER — Emergency Department (HOSPITAL_BASED_OUTPATIENT_CLINIC_OR_DEPARTMENT_OTHER)
Admission: EM | Admit: 2021-07-09 | Discharge: 2021-07-09 | Disposition: A | Discharge status: Home / Self Care | Payer: 59 | Attending: Emergency Medicine | Admitting: Emergency Medicine

## 2021-07-09 ENCOUNTER — Other Ambulatory Visit: Payer: Self-pay

## 2021-07-09 ENCOUNTER — Encounter (HOSPITAL_BASED_OUTPATIENT_CLINIC_OR_DEPARTMENT_OTHER): Payer: Self-pay

## 2021-07-09 DIAGNOSIS — Z91010 Allergy to peanuts: Secondary | ICD-10-CM | POA: Insufficient documentation

## 2021-07-09 DIAGNOSIS — K5900 Constipation, unspecified: Secondary | ICD-10-CM | POA: Diagnosis not present

## 2021-07-09 DIAGNOSIS — D509 Iron deficiency anemia, unspecified: Secondary | ICD-10-CM

## 2021-07-09 DIAGNOSIS — Z79899 Other long term (current) drug therapy: Secondary | ICD-10-CM | POA: Insufficient documentation

## 2021-07-09 DIAGNOSIS — R748 Abnormal levels of other serum enzymes: Secondary | ICD-10-CM

## 2021-07-09 DIAGNOSIS — Z91040 Latex allergy status: Secondary | ICD-10-CM | POA: Diagnosis not present

## 2021-07-09 DIAGNOSIS — R109 Unspecified abdominal pain: Secondary | ICD-10-CM | POA: Diagnosis present

## 2021-07-09 LAB — COMPREHENSIVE METABOLIC PANEL
ALT: 7 U/L (ref 0–44)
AST: 11 U/L — ABNORMAL LOW (ref 15–41)
Albumin: 4.8 g/dL (ref 3.5–5.0)
Alkaline Phosphatase: 43 U/L (ref 38–126)
Anion gap: 6 (ref 5–15)
BUN: 9 mg/dL (ref 6–20)
CO2: 27 mmol/L (ref 22–32)
Calcium: 9.5 mg/dL (ref 8.9–10.3)
Chloride: 105 mmol/L (ref 98–111)
Creatinine, Ser: 0.77 mg/dL (ref 0.44–1.00)
GFR, Estimated: >60 mL/min (ref >60)
Glucose, Bld: 94 mg/dL (ref 70–99)
Potassium: 4 mmol/L (ref 3.5–5.1)
Sodium: 138 mmol/L (ref 135–145)
Total Bilirubin: 0.3 mg/dL (ref 0.3–1.2)
Total Protein: 7.7 g/dL (ref 6.5–8.1)

## 2021-07-09 LAB — URINALYSIS, ROUTINE W REFLEX MICROSCOPIC
Bilirubin Urine: NEGATIVE
Glucose, UA: NEGATIVE mg/dL
Hgb urine dipstick: NEGATIVE
Ketones, ur: NEGATIVE mg/dL
Leukocytes,Ua: NEGATIVE
Nitrite: NEGATIVE
Specific Gravity, Urine: 1.026 (ref 1.005–1.030)
pH: 5.5 (ref 5.0–8.0)

## 2021-07-09 LAB — CBC
HCT: 38.2 % (ref 36.0–46.0)
Hemoglobin: 11.7 g/dL — ABNORMAL LOW (ref 12.0–15.0)
MCH: 22.7 pg — ABNORMAL LOW (ref 26.0–34.0)
MCHC: 30.6 g/dL (ref 30.0–36.0)
MCV: 74.2 fL — ABNORMAL LOW (ref 80.0–100.0)
Platelets: 348 10*3/uL (ref 150–400)
RBC: 5.15 MIL/uL — ABNORMAL HIGH (ref 3.87–5.11)
RDW: 13.8 % (ref 11.5–15.5)
WBC: 5.9 10*3/uL (ref 4.0–10.5)
nRBC: 0 % (ref 0.0–0.2)

## 2021-07-09 LAB — LIPASE, BLOOD: Lipase: 60 U/L — ABNORMAL HIGH (ref 11–51)

## 2021-07-09 LAB — PREGNANCY, URINE: Preg Test, Ur: NEGATIVE

## 2021-07-09 MED ORDER — ONDANSETRON 4 MG PO TBDP
8.0000 mg | ORAL_TABLET | Freq: Once | ORAL | Status: AC
  Administered 2021-07-09: 8 mg via ORAL
  Filled 2021-07-09: qty 2

## 2021-07-09 MED ORDER — GOLYTELY 236 G PO SOLR
4.0000 L | Freq: Once | ORAL | 0 refills | Status: AC
Start: 2021-07-09 — End: 2021-07-09

## 2021-07-09 NOTE — ED Notes (Signed)
Patient had an episode of vomiting in triage // MD made aware and orders placed

## 2021-07-09 NOTE — ED Provider Notes (Signed)
[facility] EMERGENCY DEPT Provider Note   CSN: [id number] Arrival date & time: 07/09/21  0040     History  Chief Complaint  Patient presents with   Constipation    Veronica Buchanan is a 26 y.o. female.  The history is provided by the patient.  Constipation She has history of bipolar disorder, GERD, seizures, asthma and comes in because of ongoing abdominal pain and constipation.  She has been having difficulty with constipation for over a month.  She did go to an urgent care center and had been taking polyethylene glycol without any improvement.  Her primary care provider had ordered a CT of her abdomen pelvis and had given her a dose of magnesium citrate which did not give much in the way of results.  She states that she is passing few hard pellets of stool but does not feel like she is adequately emptying her bowels.  There has been some associated nausea and she did vomit once in the emergency department.  She is complaining of some pain in the right mid and upper abdomen.  She denies fever, chills, sweats.   Home Medications Prior to Admission medications   Medication Sig Start Date End Date Taking? Authorizing Provider  acetaminophen (TYLENOL) 500 MG tablet Take 500 mg by mouth every 6 (six) hours as needed.    [provider]  albuterol (PROVENTIL HFA;VENTOLIN HFA) 108 (90 BASE) MCG/ACT inhaler Inhale 2 puffs into the lungs every 6 (six) hours as needed for wheezing or shortness of breath.     [provider]  ARIPiprazole (ABILIFY) 5 MG tablet Take 5 mg by mouth daily. 06/14/21   [provider]  celecoxib (CELEBREX) 200 MG capsule Take 200 mg by mouth 2 (two) times daily. Patient not taking: Reported on 06/20/2021 01/22/21   [provider]  dicyclomine (BENTYL) 10 MG capsule Take 10 mg by mouth 2 (two) times daily. Patient not taking: Reported on 06/20/2021    [provider]  EPINEPHrine 0.3 mg/0.3 mL IJ SOAJ injection  SMARTSIG:0.3 Milligram(s) IM Once 04/24/20   [provider]  hydrOXYzine (ATARAX/VISTARIL) 25 MG tablet Take 1-2 tablets every 6 hours as needed for anxiety. 03/21/21   Vanessa Kick, MD  lisdexamfetamine (VYVANSE) 50 MG capsule Take 50 mg by mouth daily.    [provider]  Norethindrone Acetate-Ethinyl Estrad-FE (LOESTRIN 24 FE) 1-20 MG-MCG(24) tablet Take 1 tablet by mouth daily. 05/08/21   Shelly Bombard, MD  sertraline (ZOLOFT) 50 MG tablet Take 50 mg by mouth daily. 04/06/20   [provider]  ziprasidone (GEODON) 20 MG capsule Take 20 mg by mouth at bedtime.    Joline Salt, RN      Allergies    Adhesive [tape], Other, Shellfish allergy, Justicia adhatoda (malabar nut tree) [justicia adhatoda], Latex, and Peanut-containing drug products    Review of Systems   Review of Systems  Gastrointestinal:  Positive for constipation.  All other systems reviewed and are negative.  Physical Exam Updated Vital Signs BP 116/75    Pulse 78    Temp 98.1 F (36.7 C)    Resp 16    Ht 5\' 6"  (1.676 m)    Wt 56.2 kg    SpO2 98%    BMI 20.01 kg/m  Physical Exam Vitals and nursing note reviewed. Exam conducted with a chaperone present.  25 year old female, resting comfortably and in no acute distress. Vital signs are normal. Oxygen saturation is 98%, which is normal.  Head is normocephalic and atraumatic. PERRLA, EOMI. Oropharynx is clear. Neck is nontender and supple without adenopathy or JVD. Back is nontender and there is no CVA tenderness. Lungs are clear without rales, wheezes, or rhonchi. Chest is nontender. Heart has regular rate and rhythm without murmur. Abdomen is soft, flat, with mild right mid abdominal tenderness.  There is no rebound or guarding.  There are no masses or hepatosplenomegaly and peristalsis is normoactive. Rectal: Normal sphincter tone.  No stool present in the rectal ampulla. Extremities have no cyanosis or edema, full range of motion is  present. Skin is warm and dry without rash. Neurologic: Mental status is normal, cranial nerves are intact, moves all extremities equally.  ED Results / Procedures / Treatments   Labs (all labs ordered are listed, but only abnormal results are displayed) Labs Reviewed  LIPASE, BLOOD - Abnormal; Notable for the following components:      Result Value   Lipase 60 (*)    All other components within normal limits  COMPREHENSIVE METABOLIC PANEL - Abnormal; Notable for the following components:   AST 11 (*)    All other components within normal limits  CBC - Abnormal; Notable for the following components:   RBC 5.15 (*)    Hemoglobin 11.7 (*)    MCV 74.2 (*)    MCH 22.7 (*)    All other components within normal limits  URINALYSIS, ROUTINE W REFLEX MICROSCOPIC - Abnormal; Notable for the following components:   Protein, ur TRACE (*)    All other components within normal limits  PREGNANCY, URINE   Radiology DG Abdomen 1 View  Result Date: 07/09/2021 CLINICAL DATA:  Constipation. EXAM: ABDOMEN - 1 VIEW COMPARISON:  CT abdomen pelvis dated 07/04/2021. FINDINGS: The bowel gas pattern is normal. No radio-opaque calculi or other significant radiographic abnormality are seen. IMPRESSION: Negative. Electronically Signed   By: Anner Crete M.D.   On: 07/09/2021 03:03    Procedures Procedures    Medications Ordered in ED Medications  ondansetron (ZOFRAN-ODT) disintegrating tablet 8 mg (8 mg Oral Given 07/09/21 0104)    ED Course/ Medical Decision Making/ A&P                           Medical Decision Making Amount and/or Complexity of Data Reviewed Labs: ordered. Radiology: ordered.  Risk Prescription drug management.   Constipation with abdominal pain and 1 episode of emesis.  Exam does not suggest fecal impaction although constipation may be behind many of her symptoms.  Given length of time she has been symptomatic, doubt serious pathology such as appendicitis.  Consider  IBS-C.  Old records are reviewed showing CT of abdomen and pelvis on 2/9 which was unremarkable.  I have independently viewed those images and agree with the radiologist's interpretation.  Labs today show mild microcytic anemia with no significant change in hemoglobin compared with CBC from 06/06/2021.  Lipase is mildly elevated, but I doubt pancreatitis.  She has had 2 other lipase levels in our system and they have both been elevated.  At this point, abdominal exam is benign, I do not see any indication for repeat CT scan.  We will get KUB to evaluate stool burden.  KUB shows an unremarkable gas pattern.  Moderate amount of stool is present in the left colon, but not an excessive amount.  I have independently viewed the image, and agree with radiologist's interpretation.  At this point, I suspect that her abdominal  pain is most likely from irritable bowel syndrome, consider occult Crohn's disease.  She is given an prescription for polyethylene glycol bowel prep to clean her colon out and get her a fresh start so that there is no question that she is no longer constipated.  She will need to follow-up with her primary care provider, may need consultation with gastroenterology.        Final Clinical Impression(s) / ED Diagnoses Final diagnoses:  Abdominal pain, unspecified abdominal location  Constipation, unspecified constipation type  Microcytic anemia  Elevated lipase    Rx / DC Orders ED Discharge Orders          Ordered    polyethylene glycol (GOLYTELY) 236 g solution   Once        07/09/21 6644              Delora Fuel, MD 03/47/42 0330

## 2021-07-09 NOTE — Discharge Instructions (Signed)
Please follow-up with your primary care provider regarding ongoing management of constipation and abdominal pain.  If you continue to have problems, consider a consultation with a gastroenterologist.

## 2021-07-09 NOTE — ED Triage Notes (Signed)
Patient arrived POV with c/o constipation for about 3 weeks. Was seen by PCP on Thursday and was prescribed laxatives but denies relief.   Pt admits to having generalized abd and nausea. Denies episode of vomiting today.

## 2021-07-22 NOTE — Progress Notes (Signed)
07/24/2021 Veronica Buchanan [id number] 1996-01-04   ASSESSMENT AND PLAN:   Abdominal pain, epigastric Has been constant, negative CT AB 06/2021, normal EGD 2020.  Discussed at length with patient about cannabinoid hyperemesis syndrome instructed to quit smoking marijuana.  Can do trial of famotidine Can consider gastric emptying study if not improved  Nausea Suggest following with PCP to get on preventative migraine treatment, unknown how much this may be playing into it.  Can do trial of amitriptyline for nausea if treating IBS/constipation does not help Potentially do gastric emptying versus trial of xifaxin.   History of migraine Suggest following with PCP to get on preventative migraine treatment, unknown how much this may be playing into it.   Irritable bowel syndrome with constipation - Increase fiber/ water intake, decrease caffeine, increase activity level, can add  fiber and linzess samples  until BM soft.  Normal CT AB and pelvis 06/2021, normal colonoscopy 09/2020 Can consider trial of xifaxin  History of Present Illness:  26 y.o. female with a past medical history of bipolar disorder, migraines, asthma, and endometriosis and others listed below, presents for ER follow up on 07/09/21 for constipation.  Had laparoscopy for endometriosis in 2020.  Patient known to Dr. Tarri Glenn.  Patient has history of epigastric abdominal pain, bloating, gastritis negative H. pylori. 07/19/2020 CT of the abdomen pelvis normal 10/18/2018 endoscopy showed normal esophageal biopsies, mild nonspecific reactive gastropathy and normal duodenal biopsies 10/17/2020 colonoscopy mild sigmoid diverticulosis Celiac labs were negative.  Stool antigen was negative for H. pylori.  Sed rate and CRP levels were normal. Instructed to gluten-free for 1 week, given FODMAP diet, if not helping will try Xifaxan  No improvement with pantoprazole, Bentyl, or FDGuard. Using Tylenol for the pain.  She has avoid nuts,  carbonated beverages and dairy as much as possible.  She does not feel that she has excessive sugar substitute exposure. Weight is stable. Improved when lying on her left side. Temporally associated with stress.   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  Had has AB pain for years.  She has epigastric AB pain constant dull pain, worse with moving/bending. Can having nausea daily can be as soon as she gets up in the AM, can be worse with moving around. Zofran can help. Has had vertigo in the past and has history of migraines. No GERD. She get full very quickly, will have AB bloating and side pain, will get heating pad and lay down.  Can have migraines 2-3 x a week, not on prevention, can have sensitive to light, can have eye changes with it, pounding headache.  Can be lower AB pain lower AB cramping.  Has history of constipation, has never had diarrhea. No hematochezia, dark black stools.   Takes tyelnol for headaches, no NSAIDS.  No ETOH, no smoking.  She does smoke marijuana x 2020 on and off, 1-2 times a week.  She has been following with GYN for her endometreosis.    Previous GI history:  CT ABDOMEN PELVIS WO CONTRAST  Result Date: 07/04/2021 CLINICAL DATA:  History of constipation. Recently felt knot in umbilical area which moved x2 months. History of endometriosis. EXAM: CT ABDOMEN AND PELVIS WITHOUT CONTRAST TECHNIQUE: Multidetector CT imaging of the abdomen and pelvis was performed following the standard protocol without IV contrast. RADIATION DOSE REDUCTION: This exam was performed according to the departmental dose-optimization program which includes automated exposure control, adjustment of the mA and/or kV according to patient size and/or use of  iterative reconstruction technique. COMPARISON:  Ultrasound January 2023 and CT July 19, 2020 FINDINGS: Lower chest: No acute abnormality. Hepatobiliary: Unremarkable noncontrast appearance of the hepatic parenchyma. Gallbladder  is unremarkable. No biliary ductal dilation. Pancreas: No pancreatic ductal dilation or evidence of acute inflammation. Spleen: Normal in size without focal abnormality. Adrenals/Urinary Tract: Bilateral adrenal glands appear normal. No hydronephrosis. No renal, ureteral or bladder calculi identified. Stomach/Bowel: No enteric contrast was administered. Stomach is unremarkable for degree of distension. No pathologic dilation of small or large bowel. Normal appendix on image 39/3. Vascular/Lymphatic: Normal caliber abdominal aorta. No pathologically enlarged abdominal or pelvic lymph nodes within the limitation of non contrasted examination and relative paucity of visceral fat. Reproductive: Uterus and bilateral adnexa are unremarkable in noncontrast CT appearance. Other: Trace pelvic free fluid is within physiologic normal limits. No abdominal wall hernia or abnormality. Musculoskeletal: No acute or significant osseous findings. IMPRESSION: No acute abnormality in the abdomen or pelvis on this noncontrast CT. Electronically Signed   By: Dahlia Bailiff M.D.   On: 07/04/2021 15:43   DG Abdomen 1 View  Result Date: 07/09/2021 CLINICAL DATA:  Constipation. EXAM: ABDOMEN - 1 VIEW COMPARISON:  CT abdomen pelvis dated 07/04/2021. FINDINGS: The bowel gas pattern is normal. No radio-opaque calculi or other significant radiographic abnormality are seen. IMPRESSION: Negative. Electronically Signed   By: Anner Crete M.D.   On: 07/09/2021 03:03   ECHOCARDIOGRAM COMPLETE  Result Date: 07/04/2021    ECHOCARDIOGRAM REPORT   Patient Name:   Veronica Buchanan   Date of Exam: 07/04/2021 Medical Rec #:  [id number]     Height:       66.0 in Accession #:    [id number]    Weight:       123.6 lb Date of Birth:  04-05-1996      BSA:          1.630 m Patient Age:    25 years      BP:           108/60 mmHg Patient Gender: F             HR:           62 bpm. Exam Location:  [facility] Procedure: 2D Echo, 3D Echo, Color Doppler, Cardiac  Doppler and Strain Analysis Indications:    R07.89 Chest pain  History:        Patient has no prior history of Echocardiogram examinations.                 Arrythmias:Palpitation; Signs/Symptoms:Shortness of Breath.  Sonographer:    Basilia Jumbo BS, RDCS Referring Phys: [id number] [place]  1. Left ventricular ejection fraction, by estimation, is 60 to 65%. Left ventricular ejection fraction by 3D volume is 60 %. The left ventricle has normal function. The left ventricle has no regional wall motion abnormalities. Left ventricular diastolic  parameters were normal. The average left ventricular global longitudinal strain is -22.8 %. The global longitudinal strain is normal.  2. Right ventricular systolic function is normal. The right ventricular size is normal. Tricuspid regurgitation signal is inadequate for assessing PA pressure.  3. The mitral valve is normal in structure. Trivial mitral valve regurgitation. No evidence of mitral stenosis.  4. The aortic valve is normal in structure. Aortic valve regurgitation is not visualized. No aortic stenosis is present.  5. The inferior vena cava is normal in size with greater than 50% respiratory variability, suggesting right atrial pressure of 3 mmHg. FINDINGS  Left Ventricle: Left ventricular ejection fraction, by estimation, is 60 to 65%. Left ventricular ejection fraction by 3D volume is 60 %. The left ventricle has normal function. The left ventricle has no regional wall motion abnormalities. The average left ventricular global longitudinal strain is -22.8 %. The global longitudinal strain is normal. The left ventricular internal cavity size was normal in size. There is no left ventricular hypertrophy. Left ventricular diastolic parameters were normal. Normal left ventricular filling pressure. Right Ventricle: The right ventricular size is normal. No increase in right ventricular wall thickness. Right ventricular systolic function is normal. Tricuspid  regurgitation signal is inadequate for assessing PA pressure. Left Atrium: Left atrial size was normal in size. Right Atrium: Right atrial size was normal in size. Pericardium: There is no evidence of pericardial effusion. Mitral Valve: The mitral valve is normal in structure. Trivial mitral valve regurgitation. No evidence of mitral valve stenosis. Tricuspid Valve: The tricuspid valve is normal in structure. Tricuspid valve regurgitation is trivial. No evidence of tricuspid stenosis. Aortic Valve: The aortic valve is normal in structure. Aortic valve regurgitation is not visualized. No aortic stenosis is present. Pulmonic Valve: The pulmonic valve was normal in structure. Pulmonic valve regurgitation is trivial. No evidence of pulmonic stenosis. Aorta: The aortic root is normal in size and structure. Venous: The inferior vena cava is normal in size with greater than 50% respiratory variability, suggesting right atrial pressure of 3 mmHg. IAS/Shunts: No atrial level shunt detected by color flow Doppler.  LEFT VENTRICLE PLAX 2D LVIDd:         5.20 cm         Diastology LVIDs:         3.70 cm         LV e' medial:    15.80 cm/s LV PW:         0.70 cm         LV E/e' medial:  6.7 LV IVS:        0.70 cm         LV e' lateral:   28.10 cm/s LVOT diam:     2.00 cm         LV E/e' lateral: 3.8 LV SV:         82 LV SV Index:   50              2D LVOT Area:     3.14 cm        Longitudinal                                Strain                                2D Strain GLS  -21.1 %                                (A2C):                                2D Strain GLS  -24.6 %                                (A3C):  2D Strain GLS  -22.6 %                                (A4C):                                2D Strain GLS  -22.8 %                                Avg:                                 3D Volume EF                                LV 3D EF:    Left                                              ventricul                                             ar                                             ejection                                             fraction                                             by 3D                                             volume is                                             60 %.                                 3D Volume EF:                                3D EF:        60 %  LV EDV:       122 ml                                LV ESV:       48 ml                                LV SV:        74 ml RIGHT VENTRICLE             IVC RV Basal diam:  2.90 cm     IVC diam: 1.80 cm RV S prime:     13.90 cm/s TAPSE (M-mode): 2.4 cm LEFT ATRIUM             Index        RIGHT ATRIUM           Index LA diam:        3.10 cm 1.90 cm/m   RA Pressure: 3.00 mmHg LA Vol (A2C):   40.1 ml 24.60 ml/m  RA Area:     7.41 cm LA Vol (A4C):   31.9 ml 19.57 ml/m  RA Volume:   12.20 ml  7.49 ml/m LA Biplane Vol: 35.7 ml 21.90 ml/m  AORTIC VALVE LVOT Vmax:   132.00 cm/s LVOT Vmean:  89.700 cm/s LVOT VTI:    0.261 m  AORTA Ao Root diam: 2.60 cm Ao Asc diam:  2.40 cm MITRAL VALVE                TRICUSPID VALVE                             Estimated RAP:  3.00 mmHg MV Decel Time: 148 msec MV E velocity: 106.00 cm/s  SHUNTS MV A velocity: 49.70 cm/s   Systemic VTI:  0.26 m MV E/A ratio:  2.13         Systemic Diam: 2.00 cm Fransico Him MD Electronically signed by Fransico Him MD Signature Date/Time: 07/04/2021/10:28:00 AM    Final    LONG TERM MONITOR (3-14 DAYS)  Result Date: 07/22/2021 Patch Wear Time:  13 days and 21 hours (2023-01-28T16:32:35-0500 to 2023-02-11T13:46:09-498) Patient had a min HR of 44 bpm, max HR of 161 bpm, and avg HR of 85 bpm. Predominant underlying rhythm was Sinus Rhythm. Isolated SVEs were rare (<1.0%), and no SVE Couplets or SVE Triplets were present. Isolated VEs were rare (<1.0%), VE Couplets were rare (<1.0%), and no VE Triplets were present.  Ventricular Trigeminy was present. Symptoms associated with rare premature ventricular complex. Conclusion: Symptomatic rare premature ventricular complex.   Current Medications:   Current Outpatient Medications (Endocrine & Metabolic):    Norethindrone Acetate-Ethinyl Estrad-FE (LOESTRIN 24 FE) 1-20 MG-MCG(24) tablet, Take 1 tablet by mouth daily.  Current Outpatient Medications (Cardiovascular):    EPINEPHrine 0.3 mg/0.3 mL IJ SOAJ injection, SMARTSIG:0.3 Milligram(s) IM Once  Current Outpatient Medications (Respiratory):    albuterol (PROVENTIL HFA;VENTOLIN HFA) 108 (90 BASE) MCG/ACT inhaler, Inhale 2 puffs into the lungs every 6 (six) hours as needed for wheezing or shortness of breath.   Current Outpatient Medications (Analgesics):    acetaminophen (TYLENOL) 500 MG tablet, Take 500 mg by mouth every 6 (six) hours as needed.   Current Outpatient Medications (Other):    dicyclomine (BENTYL) 10 MG capsule, Take 10 mg  by mouth 2 (two) times daily.   ARIPiprazole (ABILIFY) 5 MG tablet, Take 5 mg by mouth daily.   hydrOXYzine (ATARAX/VISTARIL) 25 MG tablet, Take 1-2 tablets every 6 hours as needed for anxiety.   sertraline (ZOLOFT) 50 MG tablet, Take 50 mg by mouth daily.   Vitamin D, Ergocalciferol, (DRISDOL) 1.25 MG (50000 UNIT) CAPS capsule, Take 50,000 Units by mouth once a week.   ziprasidone (GEODON) 20 MG capsule, Take 20 mg by mouth at bedtime.  Surgical History:  She  has a past surgical history that includes laparoscopy (N/A, 12/29/2018); biopsy (N/A, 12/29/2018); Wisdom tooth extraction; Knee surgery (Right, 12/2020); and Colonoscopy. Family History:  Her family history includes Asthma in her father and another family member; Autism in her sister and sister; Colon cancer in her maternal uncle; Fibromyalgia in her mother; Heart Problems in her maternal grandfather and mother; Lupus in her cousin; Multiple sclerosis in her paternal uncle; Ovarian cancer (age of onset: 54) in her  mother. Social History:   reports that she has never smoked. She has never used smokeless tobacco. She reports current alcohol use. She reports current drug use. Drug: Marijuana.  Current Medications, Allergies, Past Medical History, Past Surgical History, Family History and Social History were reviewed in [employer] record.  Physical Exam: Ht 5\' 6"  (1.676 m)    Wt 127 lb (57.6 kg)    BMI 20.50 kg/m  General:   Pleasant, well developed female in no acute distress Eyes: sclerae anicteric,conjunctive pink  Heart:  regular rate and rhythm Pulm: Clear anteriorly; no wheezing Abdomen:  Soft, Flat AB, skin exam normal, Sluggish bowel sounds. mild tenderness in the entire abdomen, worse lower AB. Without guarding and Without rebound, without hepatomegaly. Extremities:  Without edema. Peripheral pulses intact.  Neurologic:  Alert and  oriented x4;  grossly normal neurologically. Skin:   Dry and intact without significant lesions or rashes. Psychiatric: Demonstrates good judgement and reason without abnormal affect or behaviors.  Vladimir Crofts, PA-C 07/24/21

## 2021-07-24 ENCOUNTER — Ambulatory Visit (INDEPENDENT_AMBULATORY_CARE_PROVIDER_SITE_OTHER): Payer: 59 | Admitting: Physician Assistant

## 2021-07-24 ENCOUNTER — Encounter: Payer: Self-pay | Admitting: Physician Assistant

## 2021-07-24 VITALS — BP 98/52 | HR 64 | Ht 66.0 in | Wt 127.0 lb

## 2021-07-24 DIAGNOSIS — K581 Irritable bowel syndrome with constipation: Secondary | ICD-10-CM

## 2021-07-24 DIAGNOSIS — Z8669 Personal history of other diseases of the nervous system and sense organs: Secondary | ICD-10-CM | POA: Diagnosis not present

## 2021-07-24 DIAGNOSIS — R1013 Epigastric pain: Secondary | ICD-10-CM | POA: Diagnosis not present

## 2021-07-24 DIAGNOSIS — R110 Nausea: Secondary | ICD-10-CM

## 2021-07-24 NOTE — Patient Instructions (Addendum)
If you are age 26 or older, your body mass index should be between 23-30. Your Body mass index is 20.5 kg/m. If this is out of the aforementioned range listed, please consider follow up with your Primary Care Provider.  If you are age 63 or younger, your body mass index should be between 19-25. Your Body mass index is 20.5 kg/m. If this is out of the aformentioned range listed, please consider follow up with your Primary Care Provider.   ________________________________________________________  The [facility] providers would like to encourage you to use [facility] to communicate with providers for non-urgent requests or questions.  Due to long hold times on the telephone, sending your provider a message by [facility] may be a faster and more efficient way to get a response.  Please allow 48 business hours for a response.  Please remember that this is for non-urgent requests.  _______________________________________________________  We have given you samples of the following medication to take: Linzess 123mcg # 12 Lot- [id number] exp- 10-2021  Follow up in 6 weeks. Please call [phone]   Hyperemesis cannabiod syndrome Suggest stopping marijuana to see if this helps, will need to stop for 6-9 months at least to see a difference  Talk with PCP about migraine prevention  FIBER SUPPLEMENT  Benefiber or Citracel is good for constipation/diarrhea/irritable bowel syndrome, it helps with weight loss and can help lower your bad cholesterol. Please do 1 TBSP in the morning in water, coffee, or tea. It can take up to a month before you can see a difference with your bowel movements. It is cheapest from [employer], [employer], [employer].    Linzess  Take at least 30 minutes before the first meal of the day on an empty stomach You can have a loose stool if you eat a high-fat breakfast. Give it at least 4 days, may have more bowel movements during that time.  If you continue to have severe diarrhea try every other  day, if you get AB pain with it stop.  After you are out we can send in a prescription if you did well, there is a prescription card  Linaclotide oral capsules What is this medicine? LINACLOTIDE (lin a KLOE tide) is used to treat irritable bowel syndrome (IBS) with constipation as the main problem. It may also be used for relief of chronic constipation. This medicine may be used for other purposes; ask your health care provider or pharmacist if you have questions. COMMON BRAND NAME(S): Linzess What should I tell my health care provider before I take this medicine? They need to know if you have any of these conditions: history of stool (fecal) impaction now have diarrhea or have diarrhea often other medical condition stomach or intestinal disease, including bowel obstruction or abdominal adhesions an unusual or allergic reaction to linaclotide, other medicines, foods, dyes, or preservatives pregnant or trying to get pregnant breast-feeding How should I use this medicine? Take this medicine by mouth with a glass of water. Follow the directions on the prescription label. Do not cut, crush or chew this medicine. Take on an empty stomach, at least 30 minutes before your first meal of the day. Take your medicine at regular intervals. Do not take your medicine more often than directed. Do not stop taking except on your doctor's advice. A special MedGuide will be given to you by the pharmacist with each prescription and refill. Be sure to read this information carefully each time. Talk to your pediatrician regarding the use of this medicine  in children. This medicine is not approved for use in children. Overdosage: If you think you have taken too much of this medicine contact a poison control center or emergency room at once. NOTE: This medicine is only for you. Do not share this medicine with others. What if I miss a dose? If you miss a dose, just skip that dose. Wait until your next dose, and take  only that dose. Do not take double or extra doses. What may interact with this medicine? certain medicines for bowel problems or bladder incontinence (these can cause constipation) This list may not describe all possible interactions. Give your health care provider a list of all the medicines, herbs, non-prescription drugs, or dietary supplements you use. Also tell them if you smoke, drink alcohol, or use illegal drugs. Some items may interact with your medicine. What should I watch for while using this medicine? Visit your doctor for regular check ups. Tell your doctor if your symptoms do not get better or if they get worse. Diarrhea is a common side effect of this medicine. It often begins within 2 weeks of starting this medicine. Stop taking this medicine and call your doctor if you get severe diarrhea. Stop taking this medicine and call your doctor or go to the nearest hospital emergency room right away if you develop unusual or severe stomach-area (abdominal) pain, especially if you also have bright red, bloody stools or black stools that look like tar. What side effects may I notice from receiving this medicine? Side effects that you should report to your doctor or health care professional as soon as possible: allergic reactions like skin rash, itching or hives, swelling of the face, lips, or tongue black, tarry stools bloody or watery diarrhea new or worsening stomach pain severe or prolonged diarrhea Side effects that usually do not require medical attention (report to your doctor or health care professional if they continue or are bothersome): bloating gas loose stools This list may not describe all possible side effects. Call your doctor for medical advice about side effects. You may report side effects to FDA at 1-800-FDA-1088. Where should I keep my medicine? Keep out of the reach of children. Store at room temperature between 20 and 25 degrees C (68 and 77 degrees F). Keep this  medicine in the original container. Keep tightly closed in a dry place. Do not remove the desiccant packet from the bottle, it helps to protect your medicine from moisture. Throw away any unused medicine after the expiration date. NOTE: This sheet is a summary. It may not cover all possible information. If you have questions about this medicine, talk to your doctor, pharmacist, or health care provider.  2020 Elsevier/Gold Standard (2015-06-14 12:17:04)  Gastroparesis Please do small frequent meals like 4-6 meals a day.  Eat and drink liquids at separate times.  Avoid high fiber foods, cook your vegetables, avoid high fat food.  Suggest spreading protein throughout the day (greek yogurt, glucerna, soft meat, milk, eggs) Choose soft foods that you can mash with a fork When you are more symptomatic, change to pureed foods foods and liquids.  Consider reading "Living well with Gastroparesis" by Lambert Keto Gastroparesis is a condition in which food takes longer than normal to empty from the stomach. This condition is also known as delayed gastric emptying. It is usually a long-term (chronic) condition. There is no cure, but there are treatments and things that you can do at home to help relieve symptoms. Treating the underlying condition  that causes gastroparesis can also help relieve symptoms What are the causes? In many cases, the cause of this condition is not known. Possible causes include: A hormone (endocrine) disorder, such as hypothyroidism or diabetes. A nervous system disease, such as Parkinson's disease or multiple sclerosis. Cancer, infection, or surgery that affects the stomach or vagus nerve. The vagus nerve runs from your chest, through your neck, and to the lower part of your brain. A connective tissue disorder, such as scleroderma. Certain medicines. What increases the risk? You are more likely to develop this condition if: You have certain disorders or diseases. These  may include: An endocrine disorder. An eating disorder. Amyloidosis. Scleroderma. Parkinson's disease. Multiple sclerosis. Cancer or infection of the stomach or the vagus nerve. You have had surgery on your stomach or vagus nerve. You take certain medicines. You are female. What are the signs or symptoms? Symptoms of this condition include: Feeling full after eating very little or a loss of appetite. Nausea, vomiting, or heartburn. Bloating of your abdomen. Inconsistent blood sugar (glucose) levels on blood tests. Unexplained weight loss. Acid from the stomach coming up into the esophagus (gastroesophageal reflux). Sudden tightening (spasm) of the stomach, which can be painful. Symptoms may come and go. Some people may not notice any symptoms. How is this diagnosed? This condition is diagnosed with tests, such as: Tests that check how long it takes food to move through the stomach and intestines. These tests include: Upper gastrointestinal (GI) series. For this test, you drink a liquid that shows up well on X-rays, and then X-rays are taken of your intestines. Gastric emptying scintigraphy. For this test, you eat food that contains a small amount of radioactive material, and then scans are taken. Wireless capsule GI monitoring system. For this test, you swallow a pill (capsule) that records information about how foods and fluid move through your stomach. Gastric manometry. For this test, a tube is passed down your throat and into your stomach to measure electrical and muscular activity. Endoscopy. For this test, a long, thin tube with a camera and light on the end is passed down your throat and into your stomach to check for problems in your stomach lining. Ultrasound. This test uses sound waves to create images of the inside of your body. This can help rule out gallbladder disease or pancreatitis as a cause of your symptoms. How is this treated? There is no cure for this condition,  but treatment and home care may relieve symptoms. Treatment may include: Treating the underlying cause. Managing your symptoms by making changes to your diet and exercise habits. Taking medicines to control nausea and vomiting and to stimulate stomach muscles. Getting food through a feeding tube in the hospital. This may be done in severe cases. Having surgery to insert a device called a gastric electrical stimulator into your body. This device helps improve stomach emptying and control nausea and vomiting. Follow these instructions at home: Take over-the-counter and prescription medicines only as told by your health care provider. Follow instructions from your health care provider about eating or drinking restrictions. Your health care provider may recommend that you: Eat smaller meals more often. Eat low-fat foods. Eat low-fiber forms of high-fiber foods. For example, eat cooked vegetables instead of raw vegetables. Have only liquid foods instead of solid foods. Liquid foods are easier to digest. Drink enough fluid to keep your urine pale yellow. Exercise as often as told by your health care provider. Keep all follow-up visits. This is important.  Contact a health care provider if you: Notice that your symptoms do not improve with treatment. Have new symptoms. Get help right away if you: Have severe pain in your abdomen that does not improve with treatment. Have nausea that is severe or does not go away. Vomit every time you drink fluids. Summary Gastroparesis is a long-term (chronic) condition in which food takes longer than normal to empty from the stomach. Symptoms include nausea, vomiting, heartburn, bloating of your abdomen, and loss of appetite. Eating smaller portions, low-fat foods, and low-fiber forms of high-fiber foods may help you manage your symptoms. Get help right away if you have severe pain in your abdomen. This information is not intended to replace advice given to you  by your health care provider. Make sure you discuss any questions you have with your health care provider. Document Revised: 09/19/2019 Document Reviewed: 09/19/2019 Elsevier Patient Education  2021 [employer].

## 2021-07-25 NOTE — Progress Notes (Signed)
Reviewed and agree with management plans. ? ?Yaris Ferrell L. Chatham Howington, MD, MPH  ?

## 2021-07-30 ENCOUNTER — Telehealth: Payer: Self-pay

## 2021-07-30 NOTE — Telephone Encounter (Signed)
Patient called with concerns for a cat/dog dander allergy  ?

## 2021-08-06 ENCOUNTER — Other Ambulatory Visit: Payer: Self-pay | Admitting: Orthopedic Surgery

## 2021-08-06 DIAGNOSIS — M25511 Pain in right shoulder: Secondary | ICD-10-CM

## 2021-08-14 ENCOUNTER — Other Ambulatory Visit: Payer: Self-pay

## 2021-08-14 ENCOUNTER — Telehealth: Payer: Self-pay | Admitting: Physician Assistant

## 2021-08-14 MED ORDER — LINACLOTIDE 145 MCG PO CAPS: 145.0000 ug | ORAL_CAPSULE | Freq: Every day | ORAL | 1 refills | Status: DC

## 2021-08-14 NOTE — Telephone Encounter (Signed)
Recall follow up visit has been made. Rx has been filled as instructed by A. Silverio Lay PA  ?

## 2021-08-14 NOTE — Telephone Encounter (Signed)
Patient called back to report how she did with the [place].  She said it definitely helped her go to the bathroom and with her nausea.  She still is having the pain, but the other symptoms seemed to improve.  However, now that she is not taking the Linzess, her symptoms have returned.  Please call and advise.  Thank you. ?

## 2021-08-14 NOTE — Telephone Encounter (Signed)
Spoke with patient regarding linzess & she is aware that prescription has been sent to pharmacy. ?

## 2021-08-14 NOTE — Telephone Encounter (Signed)
Patient called states she has been doing well with the Linzess and she no longer has any. Please advise. ?

## 2021-08-14 NOTE — Telephone Encounter (Signed)
Recall follow up visit made. Rx has been sent as authorized. ?

## 2021-08-14 NOTE — Telephone Encounter (Signed)
Ok to proceed with a script for the Linzess 111mg one a day? ?

## 2021-08-14 NOTE — Telephone Encounter (Signed)
Please do 90 day for linzess, 1 refill and schedule for 2-3 month follow up.  ?Thanks!! ?

## 2021-08-17 ENCOUNTER — Ambulatory Visit
Admission: RE | Admit: 2021-08-17 | Discharge: 2021-08-17 | Disposition: A | Discharge status: Home / Self Care | Payer: 59 | Source: Ambulatory Visit | Attending: Orthopedic Surgery | Admitting: Orthopedic Surgery

## 2021-08-17 ENCOUNTER — Other Ambulatory Visit: Payer: Self-pay

## 2021-08-17 DIAGNOSIS — M25511 Pain in right shoulder: Secondary | ICD-10-CM

## 2021-08-21 ENCOUNTER — Other Ambulatory Visit: Payer: 59

## 2021-11-12 ENCOUNTER — Telehealth: Payer: Self-pay | Admitting: Rheumatology

## 2021-11-12 NOTE — Telephone Encounter (Signed)
Patient called the office stating a referral had been sent for her to see Dr. Estanislado Pandy. Patient last seen 9/21. Patient would still be current patient but appt notes on NPT say 1 time visit. Ok to schedule follow up? Does patient need new referral?

## 2021-11-12 NOTE — Telephone Encounter (Signed)
All autoimmune work-up was negative.  She should follow-up with her PCP.

## 2021-11-13 NOTE — Telephone Encounter (Signed)
Okay to keep the appointment.

## 2021-11-13 NOTE — Telephone Encounter (Signed)
Patient states she had labwork with PCP Dr. Eldridge Abrahams which showed positive ANA (in epic) and requested that she follow back up with Dr. Estanislado Pandy.  Appt scheduled for 12/13/21.
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
## 2021-12-13 ENCOUNTER — Encounter: Payer: Self-pay | Admitting: Rheumatology

## 2021-12-13 ENCOUNTER — Telehealth: Payer: Self-pay | Admitting: Rheumatology

## 2021-12-13 ENCOUNTER — Ambulatory Visit (INDEPENDENT_AMBULATORY_CARE_PROVIDER_SITE_OTHER): Payer: 59 | Admitting: Rheumatology

## 2021-12-13 VITALS — BP 112/67 | HR 82 | Ht 66.0 in | Wt 127.0 lb

## 2021-12-13 DIAGNOSIS — G4709 Other insomnia: Secondary | ICD-10-CM | POA: Diagnosis not present

## 2021-12-13 DIAGNOSIS — Z8709 Personal history of other diseases of the respiratory system: Secondary | ICD-10-CM

## 2021-12-13 DIAGNOSIS — M7918 Myalgia, other site: Secondary | ICD-10-CM | POA: Diagnosis not present

## 2021-12-13 DIAGNOSIS — M546 Pain in thoracic spine: Secondary | ICD-10-CM

## 2021-12-13 DIAGNOSIS — R768 Other specified abnormal immunological findings in serum: Secondary | ICD-10-CM | POA: Diagnosis not present

## 2021-12-13 DIAGNOSIS — Z8669 Personal history of other diseases of the nervous system and sense organs: Secondary | ICD-10-CM

## 2021-12-13 DIAGNOSIS — G8929 Other chronic pain: Secondary | ICD-10-CM

## 2021-12-13 DIAGNOSIS — R5383 Other fatigue: Secondary | ICD-10-CM | POA: Diagnosis not present

## 2021-12-13 DIAGNOSIS — Z8659 Personal history of other mental and behavioral disorders: Secondary | ICD-10-CM

## 2021-12-13 DIAGNOSIS — K5909 Other constipation: Secondary | ICD-10-CM

## 2021-12-13 DIAGNOSIS — M25511 Pain in right shoulder: Secondary | ICD-10-CM

## 2021-12-13 DIAGNOSIS — E559 Vitamin D deficiency, unspecified: Secondary | ICD-10-CM

## 2021-12-13 NOTE — Telephone Encounter (Signed)
FYI:  Patient forgot to let Dr. Estanislado Pandy know that her mom had bursitis, osteoarthritis, and fibromyalgia.

## 2021-12-13 NOTE — Telephone Encounter (Signed)
Added to patient's family history.

## 2021-12-18 LAB — PROTEIN / CREATININE RATIO, URINE
Creatinine, Urine: 210 mg/dL (ref 20–275)
Protein/Creat Ratio: 38 mg/g creat (ref 24–184)
Protein/Creatinine Ratio: 0.038 mg/mg creat (ref 0.024–0.184)
Total Protein, Urine: 8 mg/dL (ref 5–24)

## 2021-12-18 LAB — ANA: Anti Nuclear Antibody (ANA): POSITIVE — AB

## 2021-12-18 LAB — CARDIOLIPIN ANTIBODIES, IGG, IGM, IGA
Anticardiolipin IgA: <2 APL-U/mL (ref <20.0)
Anticardiolipin IgG: <2 GPL-U/mL (ref <20.0)
Anticardiolipin IgM: <2 MPL-U/mL (ref <20.0)

## 2021-12-18 LAB — RNP ANTIBODY: Ribonucleic Protein(ENA) Antibody, IgG: <1 AI

## 2021-12-18 LAB — ANTI-NUCLEAR AB-TITER (ANA TITER): ANA Titer 1: 1:320 {titer} — ABNORMAL HIGH

## 2021-12-18 LAB — CK: Total CK: 88 U/L (ref 29–143)

## 2021-12-18 LAB — BETA-2 GLYCOPROTEIN ANTIBODIES
Beta-2 Glyco 1 IgA: <2 U/mL (ref <20.0)
Beta-2 Glyco 1 IgM: <2 U/mL (ref <20.0)
Beta-2 Glyco I IgG: <2 U/mL (ref <20.0)

## 2021-12-18 LAB — ANTI-SMITH ANTIBODY: ENA SM Ab Ser-aCnc: <1 AI

## 2021-12-18 LAB — C3 AND C4
C3 Complement: 107 mg/dL (ref 83–193)
C4 Complement: 19 mg/dL (ref 15–57)

## 2021-12-18 LAB — ANTI-SCLERODERMA ANTIBODY: Scleroderma (Scl-70) (ENA) Antibody, IgG: <1 AI

## 2021-12-18 LAB — SJOGRENS SYNDROME-A EXTRACTABLE NUCLEAR ANTIBODY: SSA (Ro) (ENA) Antibody, IgG: <1 AI

## 2021-12-18 LAB — GLUCOSE 6 PHOSPHATE DEHYDROGENASE: G-6PDH: 16.1 U/g Hgb (ref 7.0–20.5)

## 2021-12-18 LAB — ANTI-DNA ANTIBODY, DOUBLE-STRANDED: ds DNA Ab: <1 IU/mL

## 2021-12-18 LAB — SJOGRENS SYNDROME-B EXTRACTABLE NUCLEAR ANTIBODY: SSB (La) (ENA) Antibody, IgG: <1 AI

## 2021-12-18 NOTE — Progress Notes (Signed)
ANA is positive, all other autoimmune antibodies are negative.  Patient should schedule a follow-up appointment if she has ongoing symptoms.

## 2021-12-19 ENCOUNTER — Encounter: Payer: Self-pay | Admitting: Physician Assistant
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
## 2021-12-24 ENCOUNTER — Encounter: Payer: Self-pay | Admitting: Rheumatology

## 2021-12-24 ENCOUNTER — Ambulatory Visit: Payer: 59 | Attending: Rheumatology | Admitting: Rheumatology

## 2021-12-24 VITALS — BP 120/84 | HR 95 | Resp 16 | Ht 66.0 in | Wt 126.0 lb

## 2021-12-24 DIAGNOSIS — M7918 Myalgia, other site: Secondary | ICD-10-CM

## 2021-12-24 DIAGNOSIS — R5383 Other fatigue: Secondary | ICD-10-CM

## 2021-12-24 DIAGNOSIS — G4709 Other insomnia: Secondary | ICD-10-CM | POA: Diagnosis not present

## 2021-12-24 DIAGNOSIS — N946 Dysmenorrhea, unspecified: Secondary | ICD-10-CM

## 2021-12-24 DIAGNOSIS — M797 Fibromyalgia: Secondary | ICD-10-CM

## 2021-12-24 DIAGNOSIS — M25511 Pain in right shoulder: Secondary | ICD-10-CM

## 2021-12-24 DIAGNOSIS — R768 Other specified abnormal immunological findings in serum: Secondary | ICD-10-CM | POA: Diagnosis not present

## 2021-12-24 DIAGNOSIS — Z8669 Personal history of other diseases of the nervous system and sense organs: Secondary | ICD-10-CM

## 2021-12-24 DIAGNOSIS — G8929 Other chronic pain: Secondary | ICD-10-CM

## 2021-12-24 DIAGNOSIS — Z8709 Personal history of other diseases of the respiratory system: Secondary | ICD-10-CM

## 2021-12-24 DIAGNOSIS — E559 Vitamin D deficiency, unspecified: Secondary | ICD-10-CM

## 2021-12-24 DIAGNOSIS — K5909 Other constipation: Secondary | ICD-10-CM

## 2021-12-24 DIAGNOSIS — Z8659 Personal history of other mental and behavioral disorders: Secondary | ICD-10-CM

## 2021-12-24 NOTE — Patient Instructions (Signed)
Please come in for lab work 2 weeks prior to your appointment.

## 2022-01-07 ENCOUNTER — Encounter: Payer: Self-pay | Admitting: Physical Therapy

## 2022-01-07 ENCOUNTER — Ambulatory Visit: Payer: 59 | Attending: Rheumatology | Admitting: Physical Therapy

## 2022-01-07 DIAGNOSIS — M5440 Lumbago with sciatica, unspecified side: Secondary | ICD-10-CM | POA: Insufficient documentation

## 2022-01-07 DIAGNOSIS — G8929 Other chronic pain: Secondary | ICD-10-CM | POA: Insufficient documentation

## 2022-01-07 DIAGNOSIS — M6281 Muscle weakness (generalized): Secondary | ICD-10-CM | POA: Diagnosis present

## 2022-01-07 DIAGNOSIS — M797 Fibromyalgia: Secondary | ICD-10-CM | POA: Diagnosis present
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
## 2022-01-07 NOTE — Patient Instructions (Addendum)
Aquatic Therapy at [facility]-  What to Expect!  Where:   [facility] @ [place], [place] [address] Rehab phone [phone]  NOTE:  You will receive an automated phone message reminding you of your appt and it will say the appointment is at the [facility].          How to Prepare: Please make sure you drink 8 ounces of water about one hour prior to your pool session A caregiver may attend if needed with the patient to help assist as needed. A caregiver can sit in the pool room on chair. Please arrive IN YOUR SUIT and 15 minutes prior to your appointment - this helps to avoid delays in starting your session. Please make sure to attend to any toileting needs prior to entering the pool [place] rooms for changing are provided.   There is direct access to the pool deck form the locker room.  You can lock your belongings in a locker with lock provided. Once on the pool deck your therapist will ask if you have signed the Patient  Consent and Assignment of Benefits form before beginning treatment Your therapist may take your blood pressure prior to, during and after your session if indicated We usually try and create a home exercise program based on activities we do in the pool.  Please be thinking about who might be able to assist you in the pool should you need to participate in an aquatic home exercise program at the time of discharge if you need assistance.  Some patients do not want to or do not have the ability to participate in an aquatic home program - this is not a barrier in any way to you participating in aquatic therapy as part of your current therapy plan! After Discharge from PT, you can continue using home program at  the [facility], there is a drop-in fee for $5 ($45 a month)or for 60 years  or older $4.00 ($40 a month for seniors ) or any local [facility] pool.  Memberships for purchase are  available for gym/pool at [facility] LAST AQUATIC VISIT.  PLEASE MAKE SURE THAT YOU HAVE A [address]  APPOINTMENT SCHEDULED.   About the pool: Pool is located approximately 500 FT from the entrance of the building.  Please bring a support person if you need assistance traveling this      distance.   Your therapist will assist you in entering the water; there are two ways to           enter: stairs with railings, and a mechanical lift. Your therapist will determine the most appropriate way for you.  Water temperature is usually between 88-90 degrees  There may be up to 2 other swimmers in the pool at the same time  The pool deck is tile, please wear shoes with good traction if you prefer not to be barefoot.    Contact Info:  For appointment scheduling and cancellations:         Please call the [facility] @ [facility]       All sessions are 45 minutes  WALKING  Walking is a great form of exercise to increase your strength, endurance and overall fitness.  A walking program can help you start slowly and gradually build endurance as you go.  Everyone's ability is different, so each person's starting point will be different.  You do not have to follow them exactly.  The are just samples. You should simply find out what's right for you and stick to that program.   In the beginning, you'll start off walking 2-3 times a day for short distances.  As you get stronger, you'll be walking further at just 1-2 times per day.  Your goal is at least 30 min to 45 min a day.  7000 to 10000 steps to measure if you can is bare minimum.  A. You Can Walk For A Certain Length Of Time Each Day    Walk 5 minutes 3 times per day.  Increase 2 minutes  every 2 days (3 times per day).  Work up to 25-30 minutes (1-2 times per day).   Example:   Day 1-2 5 minutes 3 times per day  add or be aware of distance.   Day 7-8 12 minutes 2-3 times per day   Day 13-14 25 minutes 1-2 times per day  B. You Can Walk For a Certain Distance Each Day     Distance can be substituted for time.    Example:   3 trips to mailbox (at road)   3 trips to corner of block   3 trips around the block  C. Go to local high school and use the track.    Walk for distance ____ around track  Or time ____ minutes  D. Walk __x__ Jog ____ Run ___  Please only do the exercises that your therapist has initialed and dated   Access Code: [email or web address] URL: [email or web address] Date: 01/07/2022 Prepared by: Voncille Lo  Exercises - Supine Pelvic Tilt  - 1 x daily - 7 x weekly - 3 sets - 10 reps - Supine Single Knee to Chest Stretch  - 2 x daily - 7 x weekly - 1 sets - 5 reps - 10 hold - Supine Lower Trunk Rotation  - 2 x daily - 7 x weekly - 1 sets - 5 reps - 20 hold - Supine Bridge  - 1 x daily - 7 x weekly - 2 sets - 10 reps - Sit to Stand with Counter Support  - 3 x daily - 7 x weekly - 1 sets - 10 reps  Voncille Lo, PT, [place] Certified Exercise Expert for the Aging Adult  01/07/22 12:24 PM Phone: [phone] Fax: [phone]

## 2022-01-14 ENCOUNTER — Ambulatory Visit: Payer: 59 | Admitting: Physical Therapy

## 2022-01-16 ENCOUNTER — Ambulatory Visit: Payer: 59 | Admitting: Physical Therapy

## 2022-01-16 ENCOUNTER — Encounter: Payer: Self-pay | Admitting: Physical Therapy

## 2022-01-16 VITALS — BP 87/59 | HR 80

## 2022-01-16 DIAGNOSIS — G8929 Other chronic pain: Secondary | ICD-10-CM

## 2022-01-16 DIAGNOSIS — M797 Fibromyalgia: Secondary | ICD-10-CM

## 2022-01-16 DIAGNOSIS — M5440 Lumbago with sciatica, unspecified side: Secondary | ICD-10-CM | POA: Diagnosis not present

## 2022-01-16 DIAGNOSIS — M6281 Muscle weakness (generalized): Secondary | ICD-10-CM
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
## 2022-01-18 ENCOUNTER — Encounter (HOSPITAL_BASED_OUTPATIENT_CLINIC_OR_DEPARTMENT_OTHER): Payer: Self-pay

## 2022-01-18 ENCOUNTER — Other Ambulatory Visit: Payer: Self-pay

## 2022-01-18 ENCOUNTER — Emergency Department (HOSPITAL_BASED_OUTPATIENT_CLINIC_OR_DEPARTMENT_OTHER)
Admission: EM | Admit: 2022-01-18 | Discharge: 2022-01-18 | Disposition: A | Discharge status: Home / Self Care | Payer: 59 | Attending: Emergency Medicine | Admitting: Emergency Medicine

## 2022-01-18 ENCOUNTER — Emergency Department (HOSPITAL_BASED_OUTPATIENT_CLINIC_OR_DEPARTMENT_OTHER): Payer: 59 | Admitting: Radiology

## 2022-01-18 DIAGNOSIS — Z79899 Other long term (current) drug therapy: Secondary | ICD-10-CM | POA: Diagnosis not present

## 2022-01-18 DIAGNOSIS — W19XXXA Unspecified fall, initial encounter: Secondary | ICD-10-CM | POA: Diagnosis not present

## 2022-01-18 DIAGNOSIS — R55 Syncope and collapse: Secondary | ICD-10-CM | POA: Diagnosis not present

## 2022-01-18 DIAGNOSIS — Z91040 Latex allergy status: Secondary | ICD-10-CM | POA: Diagnosis not present

## 2022-01-18 DIAGNOSIS — Z91010 Allergy to peanuts: Secondary | ICD-10-CM | POA: Diagnosis not present

## 2022-01-18 DIAGNOSIS — R0789 Other chest pain: Secondary | ICD-10-CM

## 2022-01-18 DIAGNOSIS — R110 Nausea: Secondary | ICD-10-CM | POA: Diagnosis not present

## 2022-01-18 DIAGNOSIS — E860 Dehydration: Secondary | ICD-10-CM | POA: Insufficient documentation

## 2022-01-18 DIAGNOSIS — R0602 Shortness of breath: Secondary | ICD-10-CM | POA: Insufficient documentation

## 2022-01-18 DIAGNOSIS — R42 Dizziness and giddiness: Secondary | ICD-10-CM | POA: Insufficient documentation

## 2022-01-18 LAB — TROPONIN I (HIGH SENSITIVITY): Troponin I (High Sensitivity): 4 ng/L (ref <18)

## 2022-01-18 MED ORDER — LACTATED RINGERS IV BOLUS
1000.0000 mL | Freq: Once | INTRAVENOUS | Status: AC
  Administered 2022-01-18: 1000 mL via INTRAVENOUS

## 2022-01-18 NOTE — ED Provider Notes (Signed)
[facility] EMERGENCY DEPT Provider Note   CSN: [id number] Arrival date & time: 01/18/22  9371     History  Chief Complaint  Patient presents with   Veronica Buchanan is a 26 y.o. female.  26 year old female with a history of frequent marijuana use who presents to the emergency department with chest pain after fall.  Patient states that on Wednesday she got home and had been feeling lightheaded.  Says that she went up the stairs and fell forward onto her knees.  Says that since then she has intermittently been feeling lightheaded.  Says that the night after she also had an event where she fell forward onto her chest.  Says that preceding her fall she felt nauseous and cold and lightheaded.  Denies any LOC with either fall.  No headache since or vomiting.  Says that prior to that she had been feeling nauseous and her primary doctor says that she may have been losing weight recently.  Says that her p.o. intake has been decreased recently.  Denies any fevers, cough, congestion, runny nose or sore throat, dysuria or frequency.  No chest pain aside from after falling on her chest.  Says that she felt short of breath shortly afterwards but is not now.   Fall       Home Medications Prior to Admission medications   Medication Sig Start Date End Date Taking? Authorizing Provider  acetaminophen (TYLENOL) 500 MG tablet Take 500 mg by mouth every 6 (six) hours as needed.    [provider]  albuterol (PROVENTIL HFA;VENTOLIN HFA) 108 (90 BASE) MCG/ACT inhaler Inhale 2 puffs into the lungs every 6 (six) hours as needed for wheezing or shortness of breath.     [provider]  celecoxib (CELEBREX) 200 MG capsule Take 200 mg by mouth as needed. 11/20/21   [provider]  dicyclomine (BENTYL) 10 MG capsule Take 10 mg by mouth 2 (two) times daily.    [provider]  EPINEPHrine 0.3 mg/0.3 mL IJ SOAJ injection SMARTSIG:0.3 Milligram(s) IM Once  04/24/20   [provider]  hydrOXYzine (ATARAX/VISTARIL) 25 MG tablet Take 1-2 tablets every 6 hours as needed for anxiety. 03/21/21   Vanessa Kick, MD  linaclotide [facility]) 145 MCG CAPS capsule Take 1 capsule (145 mcg total) by mouth daily before breakfast. 08/14/21   Vladimir Crofts, PA-C  methocarbamol (ROBAXIN) 750 MG tablet Take 750 mg by mouth every 6 (six) hours as needed. 11/20/21   [provider]  Norethindrone Acetate-Ethinyl Estrad-FE (LOESTRIN 24 FE) 1-20 MG-MCG(24) tablet Take 1 tablet by mouth daily. Patient not taking: Reported on 12/13/2021 05/08/21   Shelly Bombard, MD  sertraline (ZOLOFT) 50 MG tablet Take 50 mg by mouth daily. 04/06/20   [provider]  ziprasidone (GEODON) 20 MG capsule Take 20 mg by mouth at bedtime.    Joline Salt, RN      Allergies    Adhesive [tape], Other, Shellfish allergy, Justicia adhatoda (malabar nut tree) [justicia adhatoda], Latex, and Peanut-containing drug products    Review of Systems   Review of Systems  Physical Exam Updated Vital Signs BP (!) 111/58   Pulse (!) 51   Temp 98.5 F (36.9 C) (Oral)   Resp (!) 23   LMP 12/24/2021   SpO2 98%  Physical Exam Vitals and nursing note reviewed.  Constitutional:      General: She is not in acute distress.    Appearance: She is well-developed.  HENT:     Head: Normocephalic and atraumatic.     Right Ear: External ear normal.     Left Ear: External ear normal.     Nose: Nose normal.  Eyes:     Extraocular Movements: Extraocular movements intact.     Conjunctiva/sclera: Conjunctivae normal.     Pupils: Pupils are equal, round, and reactive to light.  Cardiovascular:     Rate and Rhythm: Normal rate and regular rhythm.     Heart sounds: No murmur heard.    Comments: Chest wall pain reproducible Pulmonary:     Effort: Pulmonary effort is normal. No respiratory distress.     Breath sounds: Normal breath sounds.     Comments: Hyperpigmented lesion  in left upper quadrant that patient states has been present for over a year.  Tenderness to palpation of right anterior ribs. Abdominal:     General: Abdomen is flat. There is no distension.     Palpations: Abdomen is soft. There is no mass.     Tenderness: There is no abdominal tenderness. There is no guarding.  Musculoskeletal:        General: No swelling.     Cervical back: Normal range of motion and neck supple.     Right lower leg: No edema.     Left lower leg: No edema.  Skin:    General: Skin is warm and dry.     Capillary Refill: Capillary refill takes 2 to 3 seconds.  Neurological:     Mental Status: She is alert and oriented to person, place, and time. Mental status is at baseline.  Psychiatric:        Mood and Affect: Mood normal.     ED Results / Procedures / Treatments   Labs (all labs ordered are listed, but only abnormal results are displayed) Labs Reviewed  TROPONIN I (HIGH SENSITIVITY)    EKG EKG Interpretation  Date/Time:  Saturday January 18 2022 08:10:40 EDT Ventricular Rate:  68 PR Interval:  154 QRS Duration: 84 QT Interval:  394 QTC Calculation: 419 R Axis:   51 Text Interpretation: Sinus arrhythmia ST elev, probable normal early repol pattern Confirmed by Margaretmary Eddy [id number]) on 01/18/2022 9:47:51 AM  Radiology DG Chest 2 View  Result Date: 01/18/2022 CLINICAL DATA:  Anterior chest pain after fall. [place] on a chest 3 days ago. Pain in the lower sternum and left ribs. EXAM: CHEST - 2 VIEW COMPARISON:  June 06, 2021 FINDINGS: The heart size and mediastinal contours are within normal limits. Both lungs are clear. The visualized skeletal structures are unremarkable. IMPRESSION: No active cardiopulmonary disease. Electronically Signed   By: Dorise Bullion III M.D.   On: 01/18/2022 08:48    Procedures Procedures   Medications Ordered in ED Medications  lactated ringers bolus 1,000 mL (1,000 mLs Intravenous New Bag/Given 01/18/22 0825)    ED  Course/ Medical Decision Making/ A&P Clinical Course as of 01/18/22 1728  Sat Jan 18, 2022  0948 0 Points via canadian syncope risk stratification.  [RP]    Clinical Course User Index [RP] Fransico Meadow, MD                           Medical Decision Making Amount and/or Complexity of Data Reviewed Radiology: ordered.   26 year old female with a history of frequent marijuana use who presents to the emergency department with chest pain after fall.   Initial Ddx:  Musculoskeletal pain, rib  fractures, cardiogenic syncope, orthostatic syncope, dehydration  MDM:  Initially was concerned about musculoskeletal pain as the etiology of the patient's chest pain given that she fell directly onto her chest and had reproducible chest pain on exam.  In terms of causes of the patient's presyncope and fall considered cardiogenic but feel that given the patient's decreased p.o. intake and nausea that dehydration and orthostatic syncope are more likely.  I have reviewed the patient's labs from her recent visit which included a normal D-dimer, CBC, CMP that were reassuring as well as TSH.  Do not feel that repeat labs are warranted at this time but I have considered them.  Will obtain a single troponin for restratification of her syncope.  Plan:  Troponin EKG Chest x-ray Fluids  ED Summary:  Patient underwent the above work-up that was reassuring.  Her EKG showed early repolarization without any other ischemic changes.  Her troponin was WNL.  Chest x-ray without acute findings or rib fractures.  Patient was feeling much better after IV fluids and was discharged home with instructions to follow-up with her primary doctor.  Dispo: DC Home   Additional history obtained from  Records reviewed outpatient notes I independently visualized the following imaging with scope of interpretation limited to determining acute life threatening conditions related to emergency care: Chest x-ray, which revealed no  acute abnormality labs and  Final Clinical Impression(s) / ED Diagnoses Final diagnoses:  Postural dizziness with presyncope  Dehydration  Chest wall pain    Rx / DC Orders ED Discharge Orders     None         Fransico Meadow, MD 01/18/22 1728

## 2022-01-18 NOTE — ED Triage Notes (Signed)
She tells me she fell this Wed. She is c/o mid abd. Pain R > L.

## 2022-01-18 NOTE — Discharge Instructions (Addendum)
Today you were seen in the emergency department for your dizziness and chest pain after falling.    In the emergency department you had an ekg, lab work, and chest x-ray that were reassuring.    At home, please tylenol and motrin as needed for your chest discomfort.  Stay well hydrated to prevent passing out.  Follow-up with your primary doctor in 2-3 days regarding your visit.    Return immediately to the emergency department if you experience any of the following: worsening pain, difficulty breathing, loss of consciousness, or any other concerning symptoms.    Thank you for visiting our Emergency Department. It was a pleasure taking care of you today.

## 2022-01-21 ENCOUNTER — Encounter: Payer: Self-pay | Admitting: Physical Therapy

## 2022-01-21 ENCOUNTER — Ambulatory Visit: Payer: 59 | Admitting: Physical Therapy

## 2022-01-21 DIAGNOSIS — M6281 Muscle weakness (generalized): Secondary | ICD-10-CM

## 2022-01-21 DIAGNOSIS — G8929 Other chronic pain: Secondary | ICD-10-CM

## 2022-01-21 DIAGNOSIS — M5440 Lumbago with sciatica, unspecified side: Secondary | ICD-10-CM | POA: Diagnosis not present

## 2022-01-21 DIAGNOSIS — M797 Fibromyalgia: Secondary | ICD-10-CM
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
## 2022-01-23 ENCOUNTER — Ambulatory Visit: Payer: 59 | Admitting: Physical Therapy

## 2022-01-28 ENCOUNTER — Ambulatory Visit: Payer: 59 | Admitting: Physical Therapy

## 2022-01-29 ENCOUNTER — Encounter: Payer: Self-pay | Admitting: Physical Therapy

## 2022-01-29 ENCOUNTER — Ambulatory Visit: Payer: 59 | Attending: Rheumatology | Admitting: Physical Therapy

## 2022-01-29 DIAGNOSIS — M5440 Lumbago with sciatica, unspecified side: Secondary | ICD-10-CM | POA: Insufficient documentation

## 2022-01-29 DIAGNOSIS — M797 Fibromyalgia: Secondary | ICD-10-CM | POA: Diagnosis present

## 2022-01-29 DIAGNOSIS — M6281 Muscle weakness (generalized): Secondary | ICD-10-CM | POA: Insufficient documentation

## 2022-01-29 DIAGNOSIS — G8929 Other chronic pain: Secondary | ICD-10-CM | POA: Insufficient documentation
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
## 2022-01-30 ENCOUNTER — Encounter: Payer: Self-pay | Admitting: Physical Therapy

## 2022-01-30 ENCOUNTER — Ambulatory Visit: Payer: 59 | Admitting: Physical Therapy

## 2022-01-30 DIAGNOSIS — M797 Fibromyalgia: Secondary | ICD-10-CM

## 2022-01-30 DIAGNOSIS — G8929 Other chronic pain: Secondary | ICD-10-CM

## 2022-01-30 DIAGNOSIS — M6281 Muscle weakness (generalized): Secondary | ICD-10-CM

## 2022-01-30 DIAGNOSIS — M5440 Lumbago with sciatica, unspecified side: Secondary | ICD-10-CM | POA: Diagnosis not present
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
## 2022-02-04 ENCOUNTER — Ambulatory Visit: Payer: 59 | Admitting: Physical Therapy

## 2022-02-06 ENCOUNTER — Ambulatory Visit: Payer: 59 | Admitting: Physical Therapy

## 2022-02-06 ENCOUNTER — Encounter: Payer: Self-pay | Admitting: Physical Therapy

## 2022-02-06 DIAGNOSIS — G8929 Other chronic pain: Secondary | ICD-10-CM

## 2022-02-06 DIAGNOSIS — M5440 Lumbago with sciatica, unspecified side: Secondary | ICD-10-CM | POA: Diagnosis not present

## 2022-02-06 DIAGNOSIS — M797 Fibromyalgia: Secondary | ICD-10-CM

## 2022-02-06 DIAGNOSIS — M6281 Muscle weakness (generalized): Secondary | ICD-10-CM
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
## 2022-02-11 ENCOUNTER — Ambulatory Visit: Payer: 59 | Admitting: Physical Therapy

## 2022-02-13 ENCOUNTER — Ambulatory Visit: Payer: 59 | Admitting: Physical Therapy
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
## 2022-02-18 ENCOUNTER — Ambulatory Visit: Payer: 59 | Admitting: Physical Therapy

## 2022-03-03 ENCOUNTER — Ambulatory Visit: Payer: 59 | Admitting: Obstetrics and Gynecology

## 2022-03-05 NOTE — Progress Notes (Signed)
03/07/2022 Veronica Buchanan [id number] 1995-06-07  Primary GI doctor: Dr. Tarri Glenn  ASSESSMENT AND PLAN:   Epigastric abdominal pain associate with nausea occasional vomiting, atypical chest pain and weight loss. EGD 2020 with gastritis 06/2021 unremarkable CT abdomen pelvis With patient's atypical chest pain, weight loss and epigastric pain is on the is unreasonable to repeat endoscopy at this time, if this is negative consider GES/abdominal ultrasound Also had very long discussion with the patient that if this is negative high probability a lot of her symptoms are associated with uncontrolled anxiety, uncontrolled migraines, possibly endometriosis. Long discussion again about cannabinoid hyperemesis syndrome, counseled on marijuana cessation.  Irritable bowel syndrome with constipation Colon 2022 unremarkable Has had some response to Linzess 145, was given samples of 290 and will increase. Possibly related some to endometriosis, follow-up GYN  History of migraine Some history of migraines, nausea and vertigo, do think would be important to follow-up with neurology for prophylactic migraine medication in the future.  History of bipolar disorder Follow-up with primary care or psych for better control of her anxiety, as a think this may be contributing to all of her symptoms.  Endometriosis Having heavy menstrual cycles, abdominal pain and nausea, not on birth control medications at this time.  Follow-up OB/GYN   History of Present Illness:  26 y.o. female with a past medical history of bipolar disorder, migraines, asthma, fibromyalgia and endometriosis and others listed below, presents for follow up of constipation/AB pain/bloating  Had laparoscopy for endometriosis in 2020.  Patient has history of epigastric abdominal pain, bloating, gastritis negative H. pylori. 07/19/2020 CT of the abdomen pelvis normal 10/18/2018 endoscopy showed normal esophageal biopsies, mild nonspecific  reactive gastropathy and normal duodenal biopsies 10/17/2020 colonoscopy sigmoid diverticulosis Celiac labs were negative.  Stool antigen was negative for H. pylori.   Sed rate and CRP levels were normal. Last visit given linzess samples of 145 mcg.   Since being seen she has also seen: Had follow up with Dr. Estanislado Pandy for for her fibromyalgia, has had negative autoimmune work-up other than positive ANA, started with physical therapy. 01/18/2022 [facility] for fall with nausea, lightheadedness, having non exertional chest pain.  Unremarkable chest x-ray given lIVF with improvement 02/03/2022 office visit with PCP for nausea and vomiting only had 2 episodes.  Unremarkable labs.  She continues to have constipation, has been 145 mcg, still having small volume, can have BM every other day, occ daily.  Having AB cramping, RLQ radiating to her back, can feel like muscle spasm, heating pad helps. It happens daily, can last 30 mins and better after BM.  She states she is always having nausea, zofran helps some but is not working as well, vomiting 1-2 x a month.  Is concerned with her weight loss.  She states she has tried to gain weight for 6 months. According to our records she was 130 in Jan and now at 124.  She also states she has been having nonexertional chest discomfort worse after eating, no dysphagia or odynophagia. She has urinary frequency, some pressure with urine that comes out slower and she states with the constipation she has  Feels she has a lot of "spasms", states worse if she is wearing something tight on her stomach. Worse with stress.  Can't eat dairy, nuts, shellfish. She has heavy, painful menses in last 4 months. She is not on BCP.  States still has migraines because she "hit her head on the garage", occasional vertigo. Takes tylenol for headaches, no  NSAIDS.  No ETOH, no smoking.  She does smoke marijuana x 2020 on and off, 1-2 times a week.    Current Medications:    Current Outpatient Medications (Endocrine & Metabolic):    Norethindrone Acetate-Ethinyl Estrad-FE (LOESTRIN 24 FE) 1-20 MG-MCG(24) tablet, Take 1 tablet by mouth daily. (Patient not taking: Reported on 12/13/2021)  Current Outpatient Medications (Cardiovascular):    EPINEPHrine 0.3 mg/0.3 mL IJ SOAJ injection, SMARTSIG:0.3 Milligram(s) IM Once  Current Outpatient Medications (Respiratory):    albuterol (PROVENTIL HFA;VENTOLIN HFA) 108 (90 BASE) MCG/ACT inhaler, Inhale 2 puffs into the lungs every 6 (six) hours as needed for wheezing or shortness of breath.   Current Outpatient Medications (Analgesics):    acetaminophen (TYLENOL) 500 MG tablet, Take 500 mg by mouth every 6 (six) hours as needed.   celecoxib (CELEBREX) 200 MG capsule, Take 200 mg by mouth as needed.   Current Outpatient Medications (Other):    dicyclomine (BENTYL) 10 MG capsule, Take 10 mg by mouth 2 (two) times daily.   hydrOXYzine (ATARAX/VISTARIL) 25 MG tablet, Take 1-2 tablets every 6 hours as needed for anxiety.   linaclotide (LINZESS) 145 MCG CAPS capsule, Take 1 capsule (145 mcg total) by mouth daily before breakfast.   methocarbamol (ROBAXIN) 750 MG tablet, Take 750 mg by mouth every 6 (six) hours as needed.   sertraline (ZOLOFT) 50 MG tablet, Take 50 mg by mouth daily.   ziprasidone (GEODON) 20 MG capsule, Take 20 mg by mouth at bedtime.  Surgical History:  She  has a past surgical history that includes laparoscopy (N/A, 12/29/2018); biopsy (N/A, 12/29/2018); Wisdom tooth extraction; Knee surgery (Right, 12/2020); Colonoscopy; and Upper gi endoscopy. Family History:  Her family history includes Asthma in her father and another family member; Autism in her sister and sister; Bursitis in her mother; Colon cancer in her maternal uncle; Fibromyalgia in her mother; Heart Problems in her maternal grandfather and mother; Lupus in her cousin and cousin; Multiple sclerosis in her paternal uncle; Osteoarthritis in her  mother; Ovarian cancer (age of onset: 2) in her mother. Social History:   reports that she has never smoked. She has never been exposed to tobacco smoke. She has never used smokeless tobacco. She reports current alcohol use. She reports current drug use. Drug: Marijuana.  Current Medications, Allergies, Past Medical History, Past Surgical History, Family History and Social History were reviewed in [employer] record.  Physical Exam: BP 122/70   Pulse (!) 108   Ht '5\' 6"'$  (1.610 m)   Wt 124 lb 6.4 oz (56.4 kg)   SpO2 98%   BMI 20.08 kg/m  General:   Pleasant, well developed female in no acute distress Eyes: sclerae anicteric,conjunctive pink  Heart:  regular rate and rhythm Pulm: Clear anteriorly; no wheezing Abdomen:  Soft, Flat AB, skin exam normal, Sluggish bowel sounds. mild tenderness in the entire abdomen, worse epigastric. Without guarding and Without rebound, without hepatomegaly. Extremities:  Without edema. Peripheral pulses intact.  Neurologic:  Alert and  oriented x4;  grossly normal neurologically. Skin:   Dry and intact without significant lesions or rashes. Psychiatric: Demonstrates good judgement and reason, appears anxious.  Vladimir Crofts, PA-C 03/07/22

## 2022-03-07 ENCOUNTER — Encounter: Payer: Self-pay | Admitting: Physician Assistant

## 2022-03-07 ENCOUNTER — Ambulatory Visit (INDEPENDENT_AMBULATORY_CARE_PROVIDER_SITE_OTHER): Payer: 59 | Admitting: Physician Assistant

## 2022-03-07 VITALS — BP 122/70 | HR 108 | Ht 66.0 in | Wt 124.4 lb

## 2022-03-07 DIAGNOSIS — R0789 Other chest pain: Secondary | ICD-10-CM | POA: Diagnosis not present

## 2022-03-07 DIAGNOSIS — N809 Endometriosis, unspecified: Secondary | ICD-10-CM

## 2022-03-07 DIAGNOSIS — R634 Abnormal weight loss: Secondary | ICD-10-CM

## 2022-03-07 DIAGNOSIS — R1013 Epigastric pain: Secondary | ICD-10-CM | POA: Diagnosis not present

## 2022-03-07 DIAGNOSIS — Z8669 Personal history of other diseases of the nervous system and sense organs: Secondary | ICD-10-CM

## 2022-03-07 DIAGNOSIS — Z8659 Personal history of other mental and behavioral disorders: Secondary | ICD-10-CM

## 2022-03-07 DIAGNOSIS — R110 Nausea: Secondary | ICD-10-CM | POA: Diagnosis not present

## 2022-03-07 DIAGNOSIS — K581 Irritable bowel syndrome with constipation: Secondary | ICD-10-CM | POA: Diagnosis not present

## 2022-03-07 NOTE — Patient Instructions (Addendum)
Follow up with GYN for endometriosis treatment.  Suggest migraine treatment and prophylaxis.   Linzess 290 mcg *IBS-C patients may begin to experience relief from belly pain and overall abdominal symptoms (pain, discomfort, and bloating) in about 1 week,  with symptoms typically improving over 12 weeks.  Take at least 30 minutes before the first meal of the day on an empty stomach You can have a loose stool if you eat a high-fat breakfast. Give it at least 7 days, may have more bowel movements during that time.   The diarrhea should go away and you should start having normal, complete, full bowel movements.  It may be helpful to start treatment when you can be near the comfort of your own bathroom, such as a weekend.  After you are out we can send in a prescription if you did well, there is a prescription card Medication Samples have been provided to the patient.  Drug name: Linzess       Strength: 239mg        Qty: 8  LOT: [id number] Exp.Date: 03-2023  We have also given you samples of IBGARD to try take 2 capsules up to 3 x a day.  The patient has been instructed regarding the correct time, dose, and frequency of taking this medication, including desired effects and most common side effects.   You have been scheduled for an endoscopy. Please follow written instructions given to you at your visit today. If you use inhalers (even only as needed), please bring them with you on the day of your procedure.   Due to recent changes in healthcare laws, you may see the results of your imaging and laboratory studies on MyChart before your provider has had a chance to review them.  We understand that in some cases there may be results that are confusing or concerning to you. Not all laboratory results come back in the same time frame and the provider may be waiting for multiple results in order to interpret others.  Please give [email or web address] hours in order for your provider to thoroughly review all the results  before contacting the office for clarification of your results.    It was a pleasure to see you today!  Thank you for trusting me with your gastrointestinal care!

## 2022-03-10 ENCOUNTER — Ambulatory Visit (INDEPENDENT_AMBULATORY_CARE_PROVIDER_SITE_OTHER): Payer: 59 | Admitting: Obstetrics and Gynecology

## 2022-03-10 ENCOUNTER — Other Ambulatory Visit (HOSPITAL_COMMUNITY)
Admission: RE | Admit: 2022-03-10 | Discharge: 2022-03-10 | Disposition: A | Discharge status: Home / Self Care | Payer: 59 | Source: Ambulatory Visit | Attending: Obstetrics and Gynecology | Admitting: Obstetrics and Gynecology

## 2022-03-10 ENCOUNTER — Encounter: Payer: Self-pay | Admitting: Obstetrics and Gynecology

## 2022-03-10 VITALS — BP 112/64 | HR 75 | Ht 66.0 in | Wt 125.0 lb

## 2022-03-10 DIAGNOSIS — Z01411 Encounter for gynecological examination (general) (routine) with abnormal findings: Secondary | ICD-10-CM

## 2022-03-10 DIAGNOSIS — R102 Pelvic and perineal pain: Secondary | ICD-10-CM

## 2022-03-10 DIAGNOSIS — Z01419 Encounter for gynecological examination (general) (routine) without abnormal findings: Secondary | ICD-10-CM | POA: Insufficient documentation

## 2022-03-10 DIAGNOSIS — N809 Endometriosis, unspecified: Secondary | ICD-10-CM | POA: Diagnosis not present

## 2022-03-10 DIAGNOSIS — Z1331 Encounter for screening for depression: Secondary | ICD-10-CM

## 2022-03-10 DIAGNOSIS — N939 Abnormal uterine and vaginal bleeding, unspecified: Secondary | ICD-10-CM | POA: Diagnosis not present

## 2022-03-10 DIAGNOSIS — N946 Dysmenorrhea, unspecified: Secondary | ICD-10-CM | POA: Diagnosis not present

## 2022-03-10 MED ORDER — NORELGESTROMIN-ETH ESTRADIOL 150-35 MCG/24HR TD PTWK: 1.0000 | MEDICATED_PATCH | TRANSDERMAL | 15 refills | Status: DC
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
## 2022-03-11 LAB — CERVICOVAGINAL ANCILLARY ONLY
Bacterial Vaginitis (gardnerella): NEGATIVE
Candida Glabrata: NEGATIVE
Candida Vaginitis: NEGATIVE
Chlamydia: NEGATIVE
Comment: NEGATIVE
Comment: NEGATIVE
Comment: NEGATIVE
Comment: NEGATIVE
Comment: NEGATIVE
Comment: NORMAL
Neisseria Gonorrhea: NEGATIVE
Trichomonas: NEGATIVE

## 2022-03-11 LAB — CYTOLOGY - PAP
Adequacy: ABSENT
Diagnosis: NEGATIVE

## 2022-03-11 NOTE — Progress Notes (Signed)
Reviewed.  Henessy Rohrer L. Anel Creighton, MD, MPH  

## 2022-03-13 ENCOUNTER — Ambulatory Visit (INDEPENDENT_AMBULATORY_CARE_PROVIDER_SITE_OTHER): Payer: 59 | Admitting: Licensed Clinical Social Worker

## 2022-03-13 DIAGNOSIS — F320 Major depressive disorder, single episode, mild: Secondary | ICD-10-CM

## 2022-03-13 NOTE — BH Specialist Note (Signed)
Integrated Behavioral Health via Telemedicine Visit  03/13/2022 Kerston Landeck [id number]  Number of Integrated Behavioral Health Clinician visits: 1 Session Start time:  918am Session End time: 949am Total time in minutes: 31 mins via mychart video   Referring Provider: Dr. Currie Paris  Patient/Family location: Home  [facility] Provider location: [place]  All persons participating in visit: Pt J Schadler and LCSW A. Mikisha Roseland  Types of Service: Individual psychotherapy  I connected with Rotunda Cotten and/or Deniece Lack's n/a via  Telephone or Video Enabled Telemedicine Application  (Video is Caregility application) and verified that I am speaking with the correct person using two identifiers. Discussed confidentiality: Yes   I discussed the limitations of telemedicine and the availability of in person appointments.  Discussed there is a possibility of technology failure and discussed alternative modes of communication if that failure occurs.  I discussed that engaging in this telemedicine visit, they consent to the provision of behavioral healthcare and the services will be billed under their insurance.  Patient and/or legal guardian expressed understanding and consented to Telemedicine visit: Yes   Presenting Concerns: Patient and/or family reports the following symptoms/concerns: depressed mood, hx of trauma and anxiety Duration of problem: over one year ; Severity of problem: mild  Patient and/or Family's Strengths/Protective Factors: Concrete supports in place (healthy food, safe environments, etc.)  Goals Addressed: Patient will:  Reduce symptoms of: anxiety and depression   Increase knowledge and/or ability of: coping skills   Demonstrate ability to: Increase healthy adjustment to current life circumstances and Increase adequate support systems for patient/family  Progress towards Goals: Ongoing  Interventions: Interventions utilized:  Motivational Interviewing and Supportive  Counseling Standardized Assessments completed: PHQ 9  Patient and/or Family Response: Ms Fleece responded well to [employer]. Ms. Soza reports spending time in several bh facilities during her adolescence. Ms. Migliaccio reports her boyfriend is her support system.    Assessment: Patient currently experiencing mild episode of depressive disorder .   Patient may benefit from integrated behavioral health.  Plan: Follow up with behavioral health clinician on : 03/27/2022 Behavioral recommendations: Communicate needs with partner for added support, mindfulness and stress reducing activity to prevent burnout, and begin journal writing  Referral(s): [place] (In Clinic)  I discussed the assessment and treatment plan with the patient and/or parent/guardian. They were provided an opportunity to ask questions and all were answered. They agreed with the plan and demonstrated an understanding of the instructions.   They were advised to call back or seek an in-person evaluation if the symptoms worsen or if the condition fails to improve as anticipated.  Lynnea Ferrier, LCSW

## 2022-03-18 ENCOUNTER — Ambulatory Visit (HOSPITAL_BASED_OUTPATIENT_CLINIC_OR_DEPARTMENT_OTHER): Admission: RE | Admit: 2022-03-18 | Payer: 59 | Source: Ambulatory Visit

## 2022-03-21 ENCOUNTER — Encounter: Payer: 59 | Admitting: Gastroenterology

## 2022-03-21 ENCOUNTER — Telehealth: Payer: Self-pay | Admitting: Gastroenterology

## 2022-03-21 NOTE — Telephone Encounter (Signed)
Noted  

## 2022-03-21 NOTE — Telephone Encounter (Signed)
Good Morning Dr. Tarri Glenn  I called this patient a   9:15 am today and she stated she called two days ago and left a message that she needed to cancel appointment because she did not have a care partner.  She stated she will call back to reschedule.  I will NO SHOW her  Medicare

## 2022-03-24 ENCOUNTER — Other Ambulatory Visit: Payer: Self-pay

## 2022-03-24 ENCOUNTER — Telehealth: Payer: Self-pay | Admitting: Physician Assistant

## 2022-03-24 MED ORDER — LINACLOTIDE 290 MCG PO CAPS: 290.0000 ug | ORAL_CAPSULE | Freq: Every day | ORAL | 3 refills | Status: DC

## 2022-03-24 MED ORDER — LINACLOTIDE 290 MCG PO CAPS
290.0000 ug | ORAL_CAPSULE | Freq: Every day | ORAL | 0 refills | Status: DC
Start: 2022-03-24 — End: 2022-05-09
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
## 2022-03-24 NOTE — Telephone Encounter (Signed)
Refills for Linzess 290 mcg given

## 2022-03-24 NOTE — Telephone Encounter (Signed)
Patient called requesting a script for Linzess states she was given samples.

## 2022-03-25 ENCOUNTER — Encounter: Payer: Self-pay | Admitting: Rheumatology

## 2022-03-25 ENCOUNTER — Ambulatory Visit: Payer: 59 | Attending: Rheumatology | Admitting: Rheumatology

## 2022-03-25 VITALS — BP 105/66 | HR 75 | Resp 14 | Ht 66.0 in | Wt 123.2 lb

## 2022-03-25 DIAGNOSIS — M25511 Pain in right shoulder: Secondary | ICD-10-CM | POA: Diagnosis not present

## 2022-03-25 DIAGNOSIS — K5909 Other constipation: Secondary | ICD-10-CM

## 2022-03-25 DIAGNOSIS — N946 Dysmenorrhea, unspecified: Secondary | ICD-10-CM

## 2022-03-25 DIAGNOSIS — R768 Other specified abnormal immunological findings in serum: Secondary | ICD-10-CM

## 2022-03-25 DIAGNOSIS — Z8709 Personal history of other diseases of the respiratory system: Secondary | ICD-10-CM

## 2022-03-25 DIAGNOSIS — E559 Vitamin D deficiency, unspecified: Secondary | ICD-10-CM

## 2022-03-25 DIAGNOSIS — Z8669 Personal history of other diseases of the nervous system and sense organs: Secondary | ICD-10-CM

## 2022-03-25 DIAGNOSIS — M79641 Pain in right hand: Secondary | ICD-10-CM

## 2022-03-25 DIAGNOSIS — Z8659 Personal history of other mental and behavioral disorders: Secondary | ICD-10-CM

## 2022-03-25 DIAGNOSIS — M79642 Pain in left hand: Secondary | ICD-10-CM

## 2022-03-25 DIAGNOSIS — R21 Rash and other nonspecific skin eruption: Secondary | ICD-10-CM | POA: Diagnosis not present

## 2022-03-25 DIAGNOSIS — R5383 Other fatigue: Secondary | ICD-10-CM

## 2022-03-25 DIAGNOSIS — G8929 Other chronic pain: Secondary | ICD-10-CM

## 2022-03-25 DIAGNOSIS — M797 Fibromyalgia: Secondary | ICD-10-CM

## 2022-03-25 DIAGNOSIS — G4709 Other insomnia: Secondary | ICD-10-CM

## 2022-03-27 ENCOUNTER — Emergency Department (HOSPITAL_BASED_OUTPATIENT_CLINIC_OR_DEPARTMENT_OTHER)
Admission: RE | Admit: 2022-03-27 | Discharge: 2022-03-27 | Disposition: A | Discharge status: Home / Self Care | Payer: 59 | Source: Ambulatory Visit | Attending: Obstetrics and Gynecology | Admitting: Obstetrics and Gynecology

## 2022-03-27 ENCOUNTER — Emergency Department (HOSPITAL_BASED_OUTPATIENT_CLINIC_OR_DEPARTMENT_OTHER): Payer: 59

## 2022-03-27 ENCOUNTER — Other Ambulatory Visit: Payer: Self-pay

## 2022-03-27 ENCOUNTER — Emergency Department (HOSPITAL_BASED_OUTPATIENT_CLINIC_OR_DEPARTMENT_OTHER)
Admission: EM | Admit: 2022-03-27 | Discharge: 2022-03-27 | Disposition: A | Discharge status: Home / Self Care | Payer: 59 | Attending: Emergency Medicine | Admitting: Emergency Medicine

## 2022-03-27 ENCOUNTER — Ambulatory Visit (INDEPENDENT_AMBULATORY_CARE_PROVIDER_SITE_OTHER): Payer: 59 | Admitting: Licensed Clinical Social Worker

## 2022-03-27 ENCOUNTER — Encounter (HOSPITAL_BASED_OUTPATIENT_CLINIC_OR_DEPARTMENT_OTHER): Payer: Self-pay | Admitting: Emergency Medicine

## 2022-03-27 DIAGNOSIS — Y92094 Garage of other non-institutional residence as the place of occurrence of the external cause: Secondary | ICD-10-CM | POA: Insufficient documentation

## 2022-03-27 DIAGNOSIS — Z1331 Encounter for screening for depression: Secondary | ICD-10-CM

## 2022-03-27 DIAGNOSIS — Z91040 Latex allergy status: Secondary | ICD-10-CM | POA: Diagnosis not present

## 2022-03-27 DIAGNOSIS — Z91010 Allergy to peanuts: Secondary | ICD-10-CM | POA: Insufficient documentation

## 2022-03-27 DIAGNOSIS — W228XXA Striking against or struck by other objects, initial encounter: Secondary | ICD-10-CM | POA: Diagnosis not present

## 2022-03-27 DIAGNOSIS — M545 Low back pain, unspecified: Secondary | ICD-10-CM | POA: Diagnosis not present

## 2022-03-27 DIAGNOSIS — R531 Weakness: Secondary | ICD-10-CM | POA: Insufficient documentation

## 2022-03-27 DIAGNOSIS — G44319 Acute post-traumatic headache, not intractable: Secondary | ICD-10-CM | POA: Diagnosis not present

## 2022-03-27 DIAGNOSIS — N939 Abnormal uterine and vaginal bleeding, unspecified: Secondary | ICD-10-CM | POA: Insufficient documentation

## 2022-03-27 DIAGNOSIS — N946 Dysmenorrhea, unspecified: Secondary | ICD-10-CM | POA: Diagnosis not present

## 2022-03-27 DIAGNOSIS — F320 Major depressive disorder, single episode, mild: Secondary | ICD-10-CM | POA: Diagnosis not present

## 2022-03-27 DIAGNOSIS — S0990XA Unspecified injury of head, initial encounter: Secondary | ICD-10-CM | POA: Diagnosis present

## 2022-03-27 MED ORDER — OXYCODONE-ACETAMINOPHEN 5-325 MG PO TABS
1.0000 | ORAL_TABLET | Freq: Once | ORAL | Status: AC
  Administered 2022-03-27: 1 via ORAL
  Filled 2022-03-27: qty 1

## 2022-03-27 NOTE — ED Provider Notes (Signed)
[facility] EMERGENCY DEPT Provider Note   CSN: [id number] Arrival date & time: 03/27/22  8372     History  Chief Complaint  Patient presents with   Head Injury    Veronica Buchanan is a 26 y.o. female.  HPI 26 year old female history of metria cyst, chest pain, whole body pain, bipolar disorder and chronic pelvic pain presents today complaining of head injury.  She states she struck her head on the garage 1 week ago.  It is on the top of the head and there is no swelling, laceration, or hematoma.  She has continued to have a headache across the top of her head.  Did not fall to the ground, lose consciousness, she is not on blood thinners, and she had no other injury associated with this states she has contacted her doctor who told her to come to the emergency department for further evaluation.  Patient is not having any lateralized weakness or vision changes.  She complains of some dizziness. On review of systems she also complains of some low back pain shooting to her buttocks.  She states intermittently her legs are weak with this.  She has not had a loss of bowel or bladder control.  There was no injury at the time of the other injury.    Home Medications Prior to Admission medications   Medication Sig Start Date End Date Taking? Authorizing Provider  acetaminophen (TYLENOL) 500 MG tablet Take 500 mg by mouth every 6 (six) hours as needed.    [provider]  albuterol (PROVENTIL HFA;VENTOLIN HFA) 108 (90 BASE) MCG/ACT inhaler Inhale 2 puffs into the lungs every 6 (six) hours as needed for wheezing or shortness of breath.     [provider]  celecoxib (CELEBREX) 200 MG capsule Take 200 mg by mouth as needed. Patient not taking: Reported on 03/25/2022 11/20/21   [provider]  dicyclomine (BENTYL) 10 MG capsule Take 10 mg by mouth 2 (two) times daily.    [provider]  EPINEPHrine 0.3 mg/0.3 mL IJ SOAJ injection SMARTSIG:0.3  Milligram(s) IM Once 04/24/20   [provider]  hydrOXYzine (ATARAX/VISTARIL) 25 MG tablet Take 1-2 tablets every 6 hours as needed for anxiety. 03/21/21   Vanessa Kick, MD  linaclotide [facility]) 290 MCG CAPS capsule Take 1 capsule (290 mcg total) by mouth daily before breakfast. 03/24/22 04/23/22  Vladimir Crofts, PA-C  methocarbamol (ROBAXIN) 750 MG tablet Take 750 mg by mouth every 6 (six) hours as needed. 11/20/21   [provider]  norelgestromin-ethinyl estradiol Marilu Favre) 150-35 MCG/24HR transdermal patch Place 1 patch onto the skin once a week. 03/10/22   Darliss Cheney, MD  sertraline (ZOLOFT) 50 MG tablet Take 50 mg by mouth daily. 04/06/20   [provider]  ziprasidone (GEODON) 20 MG capsule Take 20 mg by mouth at bedtime.    Joline Salt, RN      Allergies    Adhesive [tape], Other, Shellfish allergy, Justicia adhatoda (malabar nut tree) [justicia adhatoda], Latex, and Peanut-containing drug products    Review of Systems   Review of Systems  Physical Exam Updated Vital Signs BP 108/70 (BP Location: Right Arm)   Pulse 69   Temp 98.4 F (36.9 C) (Oral)   Resp 16   LMP 02/16/2022 (Exact Date)   SpO2 100%  Physical Exam Vitals and nursing note reviewed.  Constitutional:      General: She is not in acute distress.    Appearance: Normal appearance. She is  not ill-appearing.  HENT:     Head: Normocephalic and atraumatic.     Right Ear: Tympanic membrane and external ear normal.     Left Ear: Tympanic membrane and external ear normal.     Nose: Nose normal.     Mouth/Throat:     Pharynx: Oropharynx is clear.  Eyes:     Extraocular Movements: Extraocular movements intact.     Pupils: Pupils are equal, round, and reactive to light.  Cardiovascular:     Rate and Rhythm: Normal rate and regular rhythm.  Pulmonary:     Effort: Pulmonary effort is normal.     Breath sounds: Normal breath sounds.  Musculoskeletal:        General:  Tenderness present. Normal range of motion.     Cervical back: Normal range of motion.     Comments: Area of tenderness to palpation over the right sacro iliac joint No fluctuance, Pittore mass, warmth, or redness  Skin:    General: Skin is warm and dry.     Capillary Refill: Capillary refill takes less than 2 seconds.  Neurological:     General: No focal deficit present.     Mental Status: She is alert and oriented to person, place, and time.     Sensory: No sensory deficit.     Motor: No weakness.     Coordination: Coordination normal.     Gait: Gait normal.     Deep Tendon Reflexes: Reflexes normal.     Comments: Patient is ambulatory without difficulty Medial thigh sensation intact  Psychiatric:        Behavior: Behavior normal.     ED Results / Procedures / Treatments   Labs (all labs ordered are listed, but only abnormal results are displayed) Labs Reviewed - No data to display  EKG None  Radiology CT Head Wo Contrast  Result Date: 03/27/2022 CLINICAL DATA:  Head injury 1 week ago.  Continued headache EXAM: CT HEAD WITHOUT CONTRAST TECHNIQUE: Contiguous axial images were obtained from the base of the skull through the vertex without intravenous contrast. RADIATION DOSE REDUCTION: This exam was performed according to the departmental dose-optimization program which includes automated exposure control, adjustment of the mA and/or kV according to patient size and/or use of iterative reconstruction technique. COMPARISON:  None Available. FINDINGS: Brain: No evidence of acute infarction, hemorrhage, hydrocephalus, extra-axial collection or mass lesion/mass effect. Vascular: Negative for hyperdense vessel Skull: Negative Sinuses/Orbits: Paranasal sinuses clear.  Negative orbit Other: None IMPRESSION: Negative CT head Electronically Signed   By: Franchot Gallo M.D.   On: 03/27/2022 08:05    Procedures Procedures    Medications Ordered in ED Medications  oxyCODONE-acetaminophen  (PERCOCET/ROXICET) 5-325 MG per tablet 1 tablet (1 tablet Oral Given 03/27/22 0751)    ED Course/ Medical Decision Making/ A&P Clinical Course as of 03/27/22 0816  Thu Mar 27, 2022  0815 CT head reviewed interpreted no evidence of acute abnormalities noted and radiologist interpretation concurs [DR]    Clinical Course User Index [DR] Pattricia Boss, MD                           Medical Decision Making 26 year old female presents with headache after striking her head.  She has a low index of suspicion for acute intracranial abnormality given the mechanism of injury and that it has been 1 week.  However, patient is very concerned regarding her ongoing headache and is spoke with her primary care  doctor.  Discussed risks and benefits of CT scanning.  Patient would like to proceed with CT scan of her head Patient has some low back pain radiating about 10 cm to her right buttock which is really at the sacroiliac joint. Some history of low back problems.  She has not had any trauma to this area.  She has no cancer history.  She is not on blood thinners.  She has a normal neurological exam.  Suspect that this is some musculoskeletal etiology although she may have some component of sciatica.  Patient advised regarding conservative therapy and return precautions and need for follow-up for further evaluation with her primary care doctor  Amount and/or Complexity of Data Reviewed Radiology: ordered and independent interpretation performed.           Final Clinical Impression(s) / ED Diagnoses Final diagnoses:  Injury of head, initial encounter  Acute post-traumatic headache, not intractable    Rx / DC Orders ED Discharge Orders     None         Pattricia Boss, MD 03/27/22 [phone]

## 2022-03-27 NOTE — BH Specialist Note (Signed)
Integrated Behavioral Health via Telemedicine Visit  03/27/2022 [name] [id number]  Number of Integrated Behavioral Health Clinician visits: 2 Session Start time:  915am Session End time: 954am Total time in minutes: 39 mins via phone   Referring Provider: Dr. Currie Paris  Patient/Family location: Home  [facility] Provider location: [place]  All persons participating in visit: Pt Veronica Buchanan and LCSW A. Katrice Goel  Types of Service: Individual psychotherapy and Telephone visit  I connected with Veronica Buchanan and/or Veronica Buchanan's n/a via  Telephone or Video Enabled Telemedicine Application  (Video is Caregility application) and verified that I am speaking with the correct person using two identifiers. Discussed confidentiality: Yes   I discussed the limitations of telemedicine and the availability of in person appointments.  Discussed there is a possibility of technology failure and discussed alternative modes of communication if that failure occurs.  I discussed that engaging in this telemedicine visit, they consent to the provision of behavioral healthcare and the services will be billed under their insurance.  Patient and/or legal guardian expressed understanding and consented to Telemedicine visit: Yes   Presenting Concerns: Patient and/or family reports the following symptoms/concerns: depression  Duration of problem: over one year; Severity of problem: mild  Patient and/or Family's Strengths/Protective Factors: Concrete supports in place (healthy food, safe environments, etc.)  Goals Addressed: Patient will:  Reduce symptoms of: depression   Increase knowledge and/or ability of: coping skills   Demonstrate ability to: Increase healthy adjustment to current life circumstances  Progress towards Goals: Ongoing  Interventions: Interventions utilized:  Motivational Interviewing and Supportive Counseling Standardized Assessments completed: PHQ 9  Patient and/or Family Response: Veronica Buchanan  reports forgetfulness and depressed mood. Veronica Buchanan reports visit to ER room for head injury  Assessment: Patient currently experiencing anxiety and depression.   Patient may benefit from integrated behavioral health.  Plan: Follow up with behavioral health clinician on : two weeks  Behavioral recommendations: Prioritize rest and self care, communicate needs with partner for added support, and keep medical appts  Referral(s): [place] (In Clinic)  I discussed the assessment and treatment plan with the patient and/or parent/guardian. They were provided an opportunity to ask questions and all were answered. They agreed with the plan and demonstrated an understanding of the instructions.   They were advised to call back or seek an in-person evaluation if the symptoms worsen or if the condition fails to improve as anticipated.  Lynnea Ferrier, LCSW

## 2022-03-27 NOTE — ED Triage Notes (Addendum)
Pt arrives to ED with c/o head injury. She reports that she hit her head on the garage door last week.

## 2022-03-27 NOTE — ED Notes (Signed)
Patient verbalizes understanding of discharge instructions. Opportunity for questioning and answers were provided. Patient discharged from ED.  °

## 2022-03-27 NOTE — Discharge Instructions (Addendum)
Please continue acetaminophen  as needed for headache. No evidence of injury is noted on the CT scan of your head Return for reevaluation if you have severe worsening pain, weakness to 1 side of the other or other new symptoms. Use topical lidocaine patches, hot and cold therapy, and gentle movement for buttocks pain.

## 2022-04-05 ENCOUNTER — Encounter (HOSPITAL_BASED_OUTPATIENT_CLINIC_OR_DEPARTMENT_OTHER): Payer: Self-pay | Admitting: Emergency Medicine

## 2022-04-05 ENCOUNTER — Emergency Department (HOSPITAL_BASED_OUTPATIENT_CLINIC_OR_DEPARTMENT_OTHER)
Admission: EM | Admit: 2022-04-05 | Discharge: 2022-04-05 | Disposition: A | Discharge status: Home / Self Care | Payer: 59 | Attending: Emergency Medicine | Admitting: Emergency Medicine

## 2022-04-05 ENCOUNTER — Other Ambulatory Visit: Payer: Self-pay

## 2022-04-05 DIAGNOSIS — Z91010 Allergy to peanuts: Secondary | ICD-10-CM | POA: Diagnosis not present

## 2022-04-05 DIAGNOSIS — R1310 Dysphagia, unspecified: Secondary | ICD-10-CM | POA: Diagnosis present

## 2022-04-05 DIAGNOSIS — Z79899 Other long term (current) drug therapy: Secondary | ICD-10-CM | POA: Diagnosis not present

## 2022-04-05 DIAGNOSIS — T7840XA Allergy, unspecified, initial encounter: Secondary | ICD-10-CM | POA: Diagnosis not present

## 2022-04-05 DIAGNOSIS — Z91040 Latex allergy status: Secondary | ICD-10-CM | POA: Diagnosis not present

## 2022-04-05 MED ORDER — PREDNISONE 20 MG PO TABS: 40.0000 mg | ORAL_TABLET | Freq: Every day | ORAL | 0 refills | Status: DC

## 2022-04-05 MED ORDER — FAMOTIDINE 20 MG PO TABS
20.0000 mg | ORAL_TABLET | Freq: Two times a day (BID) | ORAL | 0 refills | Status: DC
Start: 2022-04-05 — End: 2022-10-15

## 2022-04-05 NOTE — Discharge Instructions (Addendum)
Please take the Pepcid I am prescribing as well as the prednisone until the medications run out, if you have no ongoing symptoms you can discontinue in 2 to 3 days, and follow-up with your primary care doctor.  Please return to the emergency department if you have severe worsening of your shortness of breath, tongue swelling despite taking these medications.

## 2022-04-05 NOTE — ED Provider Notes (Signed)
[facility] EMERGENCY DEPT Provider Note   CSN: [id number] Arrival date & time: 04/05/22  1540     History {Add pertinent medical, surgical, social history, OB history to HPI:1} Chief Complaint  Patient presents with   Allergic Reaction    Veronica Buchanan is a 26 y.o. female.   Allergic Reaction      Home Medications Prior to Admission medications   Medication Sig Start Date End Date Taking? Authorizing Provider  acetaminophen (TYLENOL) 500 MG tablet Take 500 mg by mouth every 6 (six) hours as needed.    [provider]  albuterol (PROVENTIL HFA;VENTOLIN HFA) 108 (90 BASE) MCG/ACT inhaler Inhale 2 puffs into the lungs every 6 (six) hours as needed for wheezing or shortness of breath.     [provider]  celecoxib (CELEBREX) 200 MG capsule Take 200 mg by mouth as needed. Patient not taking: Reported on 03/25/2022 11/20/21   [provider]  dicyclomine (BENTYL) 10 MG capsule Take 10 mg by mouth 2 (two) times daily.    [provider]  EPINEPHrine 0.3 mg/0.3 mL IJ SOAJ injection SMARTSIG:0.3 Milligram(s) IM Once 04/24/20   [provider]  hydrOXYzine (ATARAX/VISTARIL) 25 MG tablet Take 1-2 tablets every 6 hours as needed for anxiety. 03/21/21   Vanessa Kick, MD  linaclotide [facility]) 290 MCG CAPS capsule Take 1 capsule (290 mcg total) by mouth daily before breakfast. 03/24/22 04/23/22  Vladimir Crofts, PA-C  methocarbamol (ROBAXIN) 750 MG tablet Take 750 mg by mouth every 6 (six) hours as needed. 11/20/21   [provider]  norelgestromin-ethinyl estradiol Marilu Favre) 150-35 MCG/24HR transdermal patch Place 1 patch onto the skin once a week. 03/10/22   Darliss Cheney, MD  sertraline (ZOLOFT) 50 MG tablet Take 50 mg by mouth daily. 04/06/20   [provider]  ziprasidone (GEODON) 20 MG capsule Take 20 mg by mouth at bedtime.    Joline Salt, RN      Allergies    Adhesive [tape], Other, Shellfish  allergy, Justicia adhatoda (malabar nut tree) [justicia adhatoda], Latex, and Peanut-containing drug products    Review of Systems   Review of Systems  Physical Exam Updated Vital Signs BP 123/83 (BP Location: Right Arm)   Pulse 71   Temp 98.6 F (37 C) (Oral)   Resp 18   Ht '5\' 6"'$  (1.676 m)   Wt 54.4 kg   LMP 03/15/2022 (Approximate)   SpO2 100%   BMI 19.37 kg/m  Physical Exam  ED Results / Procedures / Treatments   Labs (all labs ordered are listed, but only abnormal results are displayed) Labs Reviewed - No data to display  EKG None  Radiology No results found.  Procedures Procedures  {Document cardiac monitor, telemetry assessment procedure when appropriate:1}  Medications Ordered in ED Medications - No data to display  ED Course/ Medical Decision Making/ A&P                           Medical Decision Making  ***  {Document critical care time when appropriate:1} {Document review of labs and clinical decision tools ie heart score, Chads2Vasc2 etc:1}  {Document your independent review of radiology images, and any outside records:1} {Document your discussion with family members, caretakers, and with consultants:1} {Document social determinants of health affecting pt's care:1} {Document your decision making why or why not admission, treatments were needed:1} Final Clinical Impression(s) / ED Diagnoses Final diagnoses:  None    Rx /  DC Orders ED Discharge Orders     None       

## 2022-04-05 NOTE — ED Triage Notes (Signed)
Pt via pov from home with swollen tongue since last night. Pt states she had horchata, did not realize it had nuts in it; pt reports she didn't know how to use her epi pen and that she took 2 allergy pills. Pt alert & oriented, nad noted.

## 2022-04-05 NOTE — ED Notes (Signed)
RT called to triage to assess pt for allergic reaction. Pt uvula w/out swelling. Pt states she feels like her tongue is swollen, appears slightly swollen at this time. Pt respiratory status is stable w/no distress or stridor noted at this time. RT will continue to monitor while at [facility] ED.

## 2022-04-06 DIAGNOSIS — R1312 Dysphagia, oropharyngeal phase: Secondary | ICD-10-CM

## 2022-04-06 DIAGNOSIS — H9313 Tinnitus, bilateral: Secondary | ICD-10-CM

## 2022-04-06 HISTORY — DX: Tinnitus, bilateral: H93.13

## 2022-04-06 HISTORY — DX: Dysphagia, oropharyngeal phase: R13.12

## 2022-04-07 ENCOUNTER — Other Ambulatory Visit: Payer: Self-pay | Admitting: Otolaryngology

## 2022-04-07 DIAGNOSIS — R1312 Dysphagia, oropharyngeal phase: Secondary | ICD-10-CM

## 2022-04-07 DIAGNOSIS — K219 Gastro-esophageal reflux disease without esophagitis: Secondary | ICD-10-CM

## 2022-04-10 ENCOUNTER — Encounter: Payer: 59 | Attending: Obstetrics and Gynecology | Admitting: Physical Therapy

## 2022-04-10 ENCOUNTER — Other Ambulatory Visit: Payer: Self-pay

## 2022-04-10 ENCOUNTER — Encounter: Payer: Self-pay | Admitting: Physical Therapy

## 2022-04-10 DIAGNOSIS — R252 Cramp and spasm: Secondary | ICD-10-CM | POA: Diagnosis present

## 2022-04-10 DIAGNOSIS — R102 Pelvic and perineal pain: Secondary | ICD-10-CM | POA: Insufficient documentation

## 2022-04-10 DIAGNOSIS — M6281 Muscle weakness (generalized): Secondary | ICD-10-CM | POA: Diagnosis not present

## 2022-04-10 NOTE — Patient Instructions (Signed)
     [facility] Welcome to [facility]! Here you will find all the information you will need regarding your pool therapy. If you have further questions at any time, please call our office at [phone]. After completing your initial evaluation in the [facility], you may be eligible to complete a portion of your therapy in the pool. A typical week of therapy will consist of 1-2 typical physical therapy visits at our [facility] location and an additional session of therapy in the pool located at the [facility] at [address]. [address], [place] [address]. The phone number at the pool site is [phone]. Please call this number if you are running late or need to cancel your appointment.  Aquatic therapy will be offered on Wednesday mornings and Friday afternoons. Each session will last approximately 45 minutes. All scheduling and payments for aquatic therapy sessions, including cancelations, will be done through our [facility] location.  To be eligible for aquatic therapy, these criteria must be met: You must be able to independently change in the locker room and get to the pool deck. A caregiver can come with you to help if needed. There are benches for a caregiver to sit on next to the pool. No one with an open wound is permitted in the pool.  Handicap parking is available in the front and there is a drop off option for even closer accessibility. Please arrive 15 minutes prior to your appointment to prepare for your pool session. You must sign in at the front desk upon your arrival. Please be sure to attend to any toileting needs prior to entering the pool. Locker rooms for changing are available.  There is direct access to the pool deck from the locker room. You can lock your belongings in a locker or bring them with you poolside. Your therapist will greet you on the pool deck. There may be other swimmers in the pool at the  same time but your session is one-on-one with the therapist.   , PT [facility] [address], [address] [place], [place] [address] W: [phone] [email or web address]  
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
## 2022-04-11 ENCOUNTER — Other Ambulatory Visit: Payer: 59

## 2022-04-11 ENCOUNTER — Ambulatory Visit: Payer: Medicare Other | Admitting: Internal Medicine

## 2022-04-16 ENCOUNTER — Ambulatory Visit
Admission: RE | Admit: 2022-04-16 | Discharge: 2022-04-16 | Disposition: A | Discharge status: Home / Self Care | Payer: 59 | Source: Ambulatory Visit | Attending: Otolaryngology | Admitting: Otolaryngology

## 2022-04-16 ENCOUNTER — Encounter: Payer: Self-pay | Admitting: Radiology

## 2022-04-16 DIAGNOSIS — K219 Gastro-esophageal reflux disease without esophagitis: Secondary | ICD-10-CM

## 2022-04-16 DIAGNOSIS — R1312 Dysphagia, oropharyngeal phase: Secondary | ICD-10-CM

## 2022-04-23 ENCOUNTER — Ambulatory Visit: Payer: 59 | Attending: Rheumatology | Admitting: Physical Therapy

## 2022-04-23 DIAGNOSIS — R102 Pelvic and perineal pain: Secondary | ICD-10-CM | POA: Insufficient documentation

## 2022-04-23 DIAGNOSIS — R252 Cramp and spasm: Secondary | ICD-10-CM | POA: Insufficient documentation

## 2022-04-23 DIAGNOSIS — M6281 Muscle weakness (generalized): Secondary | ICD-10-CM | POA: Insufficient documentation

## 2022-04-23 DIAGNOSIS — M5440 Lumbago with sciatica, unspecified side: Secondary | ICD-10-CM | POA: Insufficient documentation

## 2022-04-23 DIAGNOSIS — G8929 Other chronic pain: Secondary | ICD-10-CM | POA: Insufficient documentation

## 2022-04-23 DIAGNOSIS — M797 Fibromyalgia: Secondary | ICD-10-CM | POA: Insufficient documentation
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
## 2022-04-25 ENCOUNTER — Ambulatory Visit: Payer: Medicare Other | Admitting: Internal Medicine

## 2022-04-29 ENCOUNTER — Encounter: Payer: Self-pay | Admitting: Physical Therapy

## 2022-04-29 ENCOUNTER — Encounter: Payer: 59 | Attending: Obstetrics and Gynecology | Admitting: Physical Therapy

## 2022-04-29 DIAGNOSIS — R102 Pelvic and perineal pain: Secondary | ICD-10-CM | POA: Insufficient documentation

## 2022-04-29 DIAGNOSIS — R252 Cramp and spasm: Secondary | ICD-10-CM | POA: Insufficient documentation

## 2022-04-29 DIAGNOSIS — M6281 Muscle weakness (generalized): Secondary | ICD-10-CM | POA: Diagnosis present
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
## 2022-04-30 ENCOUNTER — Telehealth: Payer: Self-pay | Admitting: Physical Therapy

## 2022-04-30 ENCOUNTER — Ambulatory Visit: Payer: 59 | Attending: Rheumatology | Admitting: Physical Therapy

## 2022-04-30 DIAGNOSIS — M5440 Lumbago with sciatica, unspecified side: Secondary | ICD-10-CM | POA: Insufficient documentation

## 2022-04-30 DIAGNOSIS — R252 Cramp and spasm: Secondary | ICD-10-CM | POA: Insufficient documentation

## 2022-04-30 DIAGNOSIS — M797 Fibromyalgia: Secondary | ICD-10-CM | POA: Insufficient documentation

## 2022-04-30 DIAGNOSIS — M6281 Muscle weakness (generalized): Secondary | ICD-10-CM | POA: Insufficient documentation

## 2022-04-30 DIAGNOSIS — G8929 Other chronic pain: Secondary | ICD-10-CM | POA: Insufficient documentation

## 2022-04-30 DIAGNOSIS — R102 Pelvic and perineal pain: Secondary | ICD-10-CM | POA: Insufficient documentation

## 2022-04-30 NOTE — Telephone Encounter (Signed)
Called patient about her missed aquatic appointment today at 10:15. Unable to complete the phone call.  Earlie Counts, PT '@12'$ /10/2021@ 12:58 PM

## 2022-05-06 ENCOUNTER — Encounter: Payer: 59 | Admitting: Physical Therapy

## 2022-05-07 ENCOUNTER — Ambulatory Visit: Payer: 59 | Admitting: Physical Therapy

## 2022-05-09 ENCOUNTER — Other Ambulatory Visit: Payer: Self-pay | Admitting: Physician Assistant

## 2022-05-13 ENCOUNTER — Encounter: Payer: 59 | Admitting: Physical Therapy

## 2022-05-14 ENCOUNTER — Ambulatory Visit: Payer: 59 | Admitting: Physical Therapy

## 2022-05-20 ENCOUNTER — Other Ambulatory Visit: Payer: Self-pay

## 2022-05-20 MED ORDER — LINACLOTIDE 290 MCG PO CAPS: ORAL_CAPSULE | ORAL | 0 refills | Status: DC

## 2022-05-20 NOTE — Telephone Encounter (Signed)
Refilled as requested  

## 2022-05-20 NOTE — Telephone Encounter (Signed)
Inbound call from patient stating she needs refill on linzess

## 2022-05-21 ENCOUNTER — Ambulatory Visit: Payer: 59 | Admitting: Physical Therapy

## 2022-05-27 ENCOUNTER — Encounter: Payer: 59 | Admitting: Physical Therapy

## 2022-05-28 ENCOUNTER — Ambulatory Visit: Payer: 59 | Admitting: Physical Therapy
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
## 2022-07-02 ENCOUNTER — Ambulatory Visit: Payer: 59 | Admitting: Rheumatology

## 2022-07-02 DIAGNOSIS — G8929 Other chronic pain: Secondary | ICD-10-CM

## 2022-07-02 DIAGNOSIS — K5909 Other constipation: Secondary | ICD-10-CM

## 2022-07-02 DIAGNOSIS — R21 Rash and other nonspecific skin eruption: Secondary | ICD-10-CM

## 2022-07-02 DIAGNOSIS — N946 Dysmenorrhea, unspecified: Secondary | ICD-10-CM

## 2022-07-02 DIAGNOSIS — E559 Vitamin D deficiency, unspecified: Secondary | ICD-10-CM

## 2022-07-02 DIAGNOSIS — M79641 Pain in right hand: Secondary | ICD-10-CM

## 2022-07-02 DIAGNOSIS — M797 Fibromyalgia: Secondary | ICD-10-CM

## 2022-07-02 DIAGNOSIS — Z8669 Personal history of other diseases of the nervous system and sense organs: Secondary | ICD-10-CM

## 2022-07-02 DIAGNOSIS — G4709 Other insomnia: Secondary | ICD-10-CM

## 2022-07-02 DIAGNOSIS — R5383 Other fatigue: Secondary | ICD-10-CM

## 2022-07-02 DIAGNOSIS — Z8709 Personal history of other diseases of the respiratory system: Secondary | ICD-10-CM

## 2022-07-02 DIAGNOSIS — R768 Other specified abnormal immunological findings in serum: Secondary | ICD-10-CM

## 2022-07-02 DIAGNOSIS — Z8659 Personal history of other mental and behavioral disorders: Secondary | ICD-10-CM

## 2022-07-07 ENCOUNTER — Telehealth: Payer: Self-pay | Admitting: *Deleted

## 2022-07-07 NOTE — Telephone Encounter (Signed)
TC from pt reporting difficulty transferring RX Xulane from [facility]. Reports was told RX not there. I called the pharmacy on the pt's behalf. Estill Bamberg, pharmacy tech reports RX is there. Pt can call to fill or pharmacist from new location can call and speak directly to [facility] for assistance with transferring RX.

## 2022-07-24 ENCOUNTER — Other Ambulatory Visit: Payer: Self-pay | Admitting: Gastroenterology

## 2022-07-24 MED ORDER — LINACLOTIDE 290 MCG PO CAPS: ORAL_CAPSULE | ORAL | 1 refills | Status: DC

## 2022-08-23 ENCOUNTER — Other Ambulatory Visit: Payer: Self-pay | Admitting: Physician Assistant

## 2022-08-26 ENCOUNTER — Emergency Department (HOSPITAL_BASED_OUTPATIENT_CLINIC_OR_DEPARTMENT_OTHER)
Admission: EM | Admit: 2022-08-26 | Discharge: 2022-08-26 | Disposition: A | Discharge status: Home / Self Care | Payer: 59 | Attending: Emergency Medicine | Admitting: Emergency Medicine

## 2022-08-26 ENCOUNTER — Other Ambulatory Visit: Payer: Self-pay

## 2022-08-26 ENCOUNTER — Other Ambulatory Visit (HOSPITAL_BASED_OUTPATIENT_CLINIC_OR_DEPARTMENT_OTHER): Payer: Self-pay

## 2022-08-26 ENCOUNTER — Encounter (HOSPITAL_BASED_OUTPATIENT_CLINIC_OR_DEPARTMENT_OTHER): Payer: Self-pay | Admitting: Emergency Medicine

## 2022-08-26 DIAGNOSIS — R0789 Other chest pain: Secondary | ICD-10-CM | POA: Insufficient documentation

## 2022-08-26 DIAGNOSIS — Z91010 Allergy to peanuts: Secondary | ICD-10-CM | POA: Diagnosis not present

## 2022-08-26 DIAGNOSIS — Z91040 Latex allergy status: Secondary | ICD-10-CM | POA: Diagnosis not present

## 2022-08-26 DIAGNOSIS — G479 Sleep disorder, unspecified: Secondary | ICD-10-CM | POA: Insufficient documentation

## 2022-08-26 DIAGNOSIS — R079 Chest pain, unspecified: Secondary | ICD-10-CM | POA: Diagnosis present

## 2022-08-26 LAB — CBC WITH DIFFERENTIAL/PLATELET
Abs Immature Granulocytes: 0.07 10*3/uL (ref 0.00–0.07)
Basophils Absolute: 0.1 10*3/uL (ref 0.0–0.1)
Basophils Relative: 1 %
Eosinophils Absolute: 0.2 10*3/uL (ref 0.0–0.5)
Eosinophils Relative: 2 %
HCT: 42.6 % (ref 36.0–46.0)
Hemoglobin: 13.1 g/dL (ref 12.0–15.0)
Immature Granulocytes: 1 %
Lymphocytes Relative: 49 %
Lymphs Abs: 5.1 10*3/uL — ABNORMAL HIGH (ref 0.7–4.0)
MCH: 23 pg — ABNORMAL LOW (ref 26.0–34.0)
MCHC: 30.8 g/dL (ref 30.0–36.0)
MCV: 74.7 fL — ABNORMAL LOW (ref 80.0–100.0)
Monocytes Absolute: 0.7 10*3/uL (ref 0.1–1.0)
Monocytes Relative: 7 %
Neutro Abs: 4.1 10*3/uL (ref 1.7–7.7)
Neutrophils Relative %: 40 %
Platelets: 454 10*3/uL — ABNORMAL HIGH (ref 150–400)
RBC: 5.7 MIL/uL — ABNORMAL HIGH (ref 3.87–5.11)
RDW: 13.8 % (ref 11.5–15.5)
WBC: 10.1 10*3/uL (ref 4.0–10.5)
nRBC: 0 % (ref 0.0–0.2)

## 2022-08-26 LAB — URINALYSIS, ROUTINE W REFLEX MICROSCOPIC
Bilirubin Urine: NEGATIVE
Glucose, UA: NEGATIVE mg/dL
Hgb urine dipstick: NEGATIVE
Ketones, ur: NEGATIVE mg/dL
Leukocytes,Ua: NEGATIVE
Nitrite: NEGATIVE
Protein, ur: NEGATIVE mg/dL
Specific Gravity, Urine: 1.014 (ref 1.005–1.030)
pH: 6.5 (ref 5.0–8.0)

## 2022-08-26 LAB — COMPREHENSIVE METABOLIC PANEL
ALT: 12 U/L (ref 0–44)
AST: 11 U/L — ABNORMAL LOW (ref 15–41)
Albumin: 4.3 g/dL (ref 3.5–5.0)
Alkaline Phosphatase: 66 U/L (ref 38–126)
Anion gap: 7 (ref 5–15)
BUN: 10 mg/dL (ref 6–20)
CO2: 30 mmol/L (ref 22–32)
Calcium: 9.2 mg/dL (ref 8.9–10.3)
Chloride: 102 mmol/L (ref 98–111)
Creatinine, Ser: 0.57 mg/dL (ref 0.44–1.00)
GFR, Estimated: >60 mL/min (ref >60)
Glucose, Bld: 86 mg/dL (ref 70–99)
Potassium: 3.4 mmol/L — ABNORMAL LOW (ref 3.5–5.1)
Sodium: 139 mmol/L (ref 135–145)
Total Bilirubin: 0.3 mg/dL (ref 0.3–1.2)
Total Protein: 7.2 g/dL (ref 6.5–8.1)

## 2022-08-26 LAB — PREGNANCY, URINE: Preg Test, Ur: NEGATIVE

## 2022-08-26 LAB — LIPASE, BLOOD: Lipase: 39 U/L (ref 11–51)

## 2022-08-26 LAB — TROPONIN I (HIGH SENSITIVITY): Troponin I (High Sensitivity): 3 ng/L (ref <18)

## 2022-08-26 MED ORDER — CELECOXIB 200 MG PO CAPS
200.0000 mg | ORAL_CAPSULE | Freq: Two times a day (BID) | ORAL | 0 refills | Status: DC
  Filled 2022-08-26: qty 20, 10d supply, fill #0

## 2022-08-26 MED ORDER — CYCLOBENZAPRINE HCL 10 MG PO TABS
5.0000 mg | ORAL_TABLET | Freq: Every evening | ORAL | 0 refills | Status: DC | PRN
  Filled 2022-08-26: qty 20, 20d supply, fill #0

## 2022-08-26 MED ORDER — CYCLOBENZAPRINE HCL 10 MG PO TABS: 5.0000 mg | ORAL_TABLET | Freq: Every evening | ORAL | 0 refills | Status: DC | PRN

## 2022-08-26 NOTE — Discharge Instructions (Addendum)
You have been diagnosed by your caregiver as having chest wall pain. °SEEK IMMEDIATE MEDICAL ATTENTION IF: °You develop a fever.  °Your chest pains become severe or intolerable.  °You develop new, unexplained symptoms (problems).  °You develop shortness of breath, nausea, vomiting, sweating or feel light headed.  °You develop a new cough or you cough up blood. ° °

## 2022-08-26 NOTE — ED Notes (Signed)
Discharge paperwork given and verbally understood. 

## 2022-08-26 NOTE — ED Triage Notes (Signed)
Pt arrives pov, steady gait with c/o LT side CP and "body jerking" yesterday. Reports CP continues, also reports HA. Also reports RT hand felt tight. Endorses hx of anxiety

## 2022-08-26 NOTE — ED Notes (Signed)
Rt placed IV in 1 stick

## 2022-08-26 NOTE — ED Provider Notes (Signed)
[facility] Provider Note   CSN: [id number] Arrival date & time: 08/26/22  [id number]     History  Chief Complaint  Patient presents with   Chest Pain    Veronica Buchanan is a 27 y.o. female who presents emergency department for chest pain.  Monday morning, 2 days ago, the patient awoke around 3:00 and was unable to move her body.  She noted twitching.  She states that this lasted for about 20 minutes before she was able to move.  About 30 minutes later she began having pain in her chest.  Since that time she has had persistent waxing and waning but constant chest pain which is not worse with exertion, position or deep breathing.  She has associated shoulder tightness stiffness and pain and yesterday went in to sign some papers at work but noticed that her right arm felt tight as she was signing papers so went home.  She has no new complaints.  She denies nausea, vomiting, diaphoresis, unilateral leg swelling, hemoptysis, history of DVT, PE.   Chest Pain      Home Medications Prior to Admission medications   Medication Sig Start Date End Date Taking? Authorizing Provider  celecoxib (CELEBREX) 200 MG capsule Take 1 capsule (200 mg total) by mouth 2 (two) times daily. As needed for chest and back discomfort 08/26/22  Yes Ilyanna Baillargeon, PA-C  cyclobenzaprine (FLEXERIL) 10 MG tablet Take 0.5-1 tablets (5-10 mg total) by mouth at bedtime as needed for muscle spasms. And muscle tension 08/26/22  Yes Rosario Duey, PA-C  acetaminophen (TYLENOL) 500 MG tablet Take 500 mg by mouth every 6 (six) hours as needed.    [provider]  albuterol (PROVENTIL HFA;VENTOLIN HFA) 108 (90 BASE) MCG/ACT inhaler Inhale 2 puffs into the lungs every 6 (six) hours as needed for wheezing or shortness of breath.     [provider]  dicyclomine (BENTYL) 10 MG capsule Take 10 mg by mouth 2 (two) times daily.    [provider]  EPINEPHrine 0.3 mg/0.3 mL IJ  SOAJ injection SMARTSIG:0.3 Milligram(s) IM Once 04/24/20   [provider]  famotidine (PEPCID) 20 MG tablet Take 1 tablet (20 mg total) by mouth 2 (two) times daily. 04/05/22   Prosperi, Christian H, PA-C  hydrOXYzine (ATARAX/VISTARIL) 25 MG tablet Take 1-2 tablets every 6 hours as needed for anxiety. 03/21/21   Vanessa Kick, MD  LINZESS 290 MCG CAPS capsule TAKE 1 CAPSULE(290 MCG) BY MOUTH DAILY BEFORE BREAKFAST 08/25/22   Vladimir Crofts, PA-C  norelgestromin-ethinyl estradiol Veronica Buchanan) 150-35 MCG/24HR transdermal patch Place 1 patch onto the skin once a week. 03/10/22   Darliss Cheney, MD  predniSONE (DELTASONE) 20 MG tablet Take 2 tablets (40 mg total) by mouth daily. 04/05/22   Prosperi, Christian H, PA-C  sertraline (ZOLOFT) 50 MG tablet Take 50 mg by mouth daily. 04/06/20   [provider]  ziprasidone (GEODON) 20 MG capsule Take 20 mg by mouth at bedtime.    Joline Salt, RN      Allergies    Adhesive [tape], Other, Shellfish allergy, Justicia adhatoda (malabar nut tree) [justicia adhatoda], Latex, and Peanut-containing drug products    Review of Systems   Review of Systems  Cardiovascular:  Positive for chest pain.    Physical Exam Updated Vital Signs BP 109/80   Pulse 79   Temp 98.5 F (36.9 C) (Oral)   Resp 19   Wt 61.2 kg   LMP 08/10/2022  SpO2 100%   BMI 21.79 kg/m  Physical Exam Vitals and nursing note reviewed.  Constitutional:      General: She is not in acute distress.    Appearance: She is well-developed. She is not diaphoretic.     Comments: Patient has a small dog sitting in her lap  HENT:     Head: Normocephalic and atraumatic.     Right Ear: External ear normal.     Left Ear: External ear normal.     Nose: Nose normal.     Mouth/Throat:     Mouth: Mucous membranes are moist.  Eyes:     General: No scleral icterus.    Conjunctiva/sclera: Conjunctivae normal.  Cardiovascular:     Rate and Rhythm: Normal rate and regular  rhythm.     Heart sounds: Normal heart sounds. No murmur heard.    No friction rub. No gallop.  Pulmonary:     Effort: Pulmonary effort is normal. No respiratory distress.     Breath sounds: Normal breath sounds.  Abdominal:     General: Bowel sounds are normal. There is no distension.     Palpations: Abdomen is soft. There is no mass.     Tenderness: There is no abdominal tenderness. There is no guarding.  Musculoskeletal:     Cervical back: Normal range of motion.       Back:  Skin:    General: Skin is warm and dry.  Neurological:     Mental Status: She is alert and oriented to person, place, and time.  Psychiatric:        Behavior: Behavior normal.     ED Results / Procedures / Treatments   Labs (all labs ordered are listed, but only abnormal results are displayed) Labs Reviewed  CBC WITH DIFFERENTIAL/PLATELET - Abnormal; Notable for the following components:      Result Value   RBC 5.70 (*)    MCV 74.7 (*)    MCH 23.0 (*)    Platelets 454 (*)    Lymphs Abs 5.1 (*)    All other components within normal limits  COMPREHENSIVE METABOLIC PANEL - Abnormal; Notable for the following components:   Potassium 3.4 (*)    AST 11 (*)    All other components within normal limits  LIPASE, BLOOD  URINALYSIS, ROUTINE W REFLEX MICROSCOPIC  PREGNANCY, URINE  TROPONIN I (HIGH SENSITIVITY)    EKG None  Radiology No results found.  Procedures Procedures    Medications Ordered in ED Medications - No data to display  ED Course/ Medical Decision Making/ A&P Clinical Course as of 08/26/22 1118  Tue Aug 26, 2022  1107 CBC with Differential(!) [AH]  1107 Urinalysis, Routine w reflex microscopic -Urine, Clean Catch [AH]  1107 Pregnancy, urine [AH]  1107 Troponin I (High Sensitivity) [AH]  1107 Comprehensive metabolic panel(!) [AH]  [id number] Lipase, blood Patient's labs reviewed.  She has had persistent chest pain now for longer than 36 hours.  She has a negative troponin.  She  does not need a second troponin. [AH]  1108 I interpreted EKG which shows normal sinus rhythm at a rate of 94 [AH]  1108 EKG 12-Lead [AH]    Clinical Course User Index [AH] Margarita Mail, PA-C                             Medical Decision Making Given the large differential diagnosis for Veronica Buchanan, the decision making in this  case is of high complexity.  After evaluating all of the data points in this case, the presentation of Veronica Buchanan is NOT consistent with Acute Coronary Syndrome (ACS) and/or myocardial ischemia, pulmonary embolism, aortic dissection; Borhaave's, significant arrythmia, pneumothorax, cardiac tamponade, or other emergent cardiopulmonary condition.  Further, the presentation of Veronica Buchanan is NOT consistent with pericarditis, myocarditis, cholecystitis, pancreatitis, mediastinitis, endocarditis, new valvular disease.  Additionally, the presentation of Veronica Buchanan NOT consistent with flail chest, cardiac contusion, ARDS, or significant intra-thoracic or intra-abdominal bleeding.  Moreover, this presentation is NOT consistent with pneumonia, sepsis, or pyelonephritis.     Strict return and follow-up precautions have been given by me personally or by detailed written instruction given verbally by nursing staff using the teach back method to the patient/family/caregiver(s).  Data Reviewed/Counseling: I have reviewed the patient's vital signs, nursing notes, and other relevant tests/information. I had a detailed discussion regarding the historical points, exam findings, and any diagnostic results supporting the discharge diagnosis. I also discussed the need for outpatient follow-up and the need to return to the ED if symptoms worsen or if there are any questions or concerns that arise at home.  Suspect sleep paralysis- refer to Neuro for sleep assessment  Amount and/or Complexity of Data Reviewed Labs: ordered. Decision-making details documented in ED  Course. ECG/medicine tests: ordered and independent interpretation performed. Decision-making details documented in ED Course.  Risk Prescription drug management.           Final Clinical Impression(s) / ED Diagnoses Final diagnoses:  Chest wall pain  Sleep disorder    Rx / DC Orders ED Discharge Orders          Ordered    Ambulatory referral to Neurology       Comments: An appointment is requested in approximately: 8 weeks   08/26/22 1116    celecoxib (CELEBREX) 200 MG capsule  2 times daily        08/26/22 1116    cyclobenzaprine (FLEXERIL) 10 MG tablet  At bedtime PRN        08/26/22 1116              Margarita Mail, PA-C 08/26/22 1118    Regan Lemming, MD 08/26/22 1624

## 2022-08-27 ENCOUNTER — Ambulatory Visit: Payer: 59 | Admitting: Rheumatology

## 2022-09-25 ENCOUNTER — Encounter: Payer: Self-pay | Admitting: Neurology

## 2022-09-25 ENCOUNTER — Institutional Professional Consult (permissible substitution): Payer: 59 | Admitting: Neurology

## 2022-09-30 ENCOUNTER — Institutional Professional Consult (permissible substitution): Payer: 59 | Admitting: Neurology

## 2022-10-15 ENCOUNTER — Encounter: Payer: Self-pay | Admitting: Neurology

## 2022-10-15 ENCOUNTER — Ambulatory Visit (INDEPENDENT_AMBULATORY_CARE_PROVIDER_SITE_OTHER): Payer: 59 | Admitting: Neurology

## 2022-10-15 VITALS — BP 101/75 | HR 92 | Ht 66.0 in | Wt 138.6 lb

## 2022-10-15 DIAGNOSIS — G4753 Recurrent isolated sleep paralysis: Secondary | ICD-10-CM

## 2022-10-15 DIAGNOSIS — G4719 Other hypersomnia: Secondary | ICD-10-CM | POA: Diagnosis not present

## 2022-10-15 DIAGNOSIS — G47411 Narcolepsy with cataplexy: Secondary | ICD-10-CM | POA: Diagnosis not present

## 2022-10-15 DIAGNOSIS — F518 Other sleep disorders not due to a substance or known physiological condition: Secondary | ICD-10-CM | POA: Diagnosis not present
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
## 2022-10-15 NOTE — Patient Instructions (Addendum)
ASSESSMENT AND PLAN 27 y.o. year old female  here with:    1)  excessive daytime sleepiness  2) isolated sleep paralysis  3)  vivid dreams  4) possible cataplectic falls and arm weakness.   This patient had been worked up by Dr. Terrace Arabia for paresthesias involving the upper extremities especially the right arm.  She now presents with a report of upper extremity right-sided weakness, falls without loss of consciousness, hypersomnia, history of anxiety attacks panic attacks and chronic insomnia in the past.  My goal is to work her up for narcolepsy versus idiopathic hypersomnia. The patient assured me that she is able to wean off her medications even on short notice.  These include sertraline and she has not taken that regular so we will ask her to stop sertraline 3 weeks prior to a PSG with MSLT to follow.  She is also not taking ziprasidone, hydroxyzine - I like her not to use these meds for 3 weeks prior to test.  but she may take Tylenol as needed has an EpiPen and may take Linzess.    Narcolepsy Narcolepsy is a disorder that causes people to fall asleep suddenly and without control (have sleep attacks) during the daytime. It is a lifelong disorder. Narcolepsy disrupts the sleep cycle at night, which then causes daytime sleepiness. What are the causes? The cause of narcolepsy is not fully understood, but it may be related to: Low levels of hypocretin, a chemical (neurotransmitter) in the brain that controls sleep and wake cycles. Hypocretin imbalance may be caused by: Abnormal genes that are passed from parent to child (inherited). An autoimmune disease in which the body's defense system (immune system) attacks the brain cells that make hypocretin. Infection, tumor, or injury in the area of the brain that controls sleep. Exposure to poisons (toxins), such as heavy metals, pesticides, and secondhand smoke. What are the signs or symptoms? Symptoms of this condition include: Excessive  daytime sleepiness. This is the most common symptom and is usually the first symptom you will notice. This may affect your performance at work or school. Sleep attacks. You may fall asleep in the middle of an activity, especially low-energy activities like reading or watching TV. Feeling like you cannot think clearly and having trouble focusing or remembering things. You may also feel depressed. Sudden muscle weakness (cataplexy). When this occurs, your speech may become slurred, or your knees may buckle. Cataplexy is usually triggered by surprise, anger, fear, or laughter. Losing the ability to speak or move (sleep paralysis). This may occur just as you start to fall asleep or wake up. You will be aware of the paralysis. It usually lasts for just a few seconds or minutes. Seeing, hearing, tasting, smelling, or feeling things that are not real (hallucinations). Hallucinations may occur with sleep paralysis. They can happen when you are falling asleep, waking up, or dozing. Trouble staying asleep at night (insomnia) and restless sleep. How is this diagnosed? This condition may be diagnosed based on: A physical exam to rule out any other problems that may be causing your symptoms. You may be asked to write down your sleeping patterns for several weeks in a sleep diary. This will help your health care provider make a diagnosis. Sleep studies that measure how well your REM sleep is regulated. These tests also measure your heart rate, breathing, movement, and brain waves. These tests include: An overnight sleep study (polysomnogram). A daytime sleep study that is done while you take several naps during the day (  multiple sleep latency test, MSLT). This test measures how quickly you fall asleep and how quickly you enter REM sleep. Removal of spinal fluid to measure hypocretin levels. How is this treated? There is no cure for this condition, but treatment can help relieve symptoms. Treatment may  include: Lifestyle and sleeping strategies to help you cope with the condition, such as: Exercising regularly. Maintaining a regular sleep schedule. Avoiding caffeine and large meals before bed. Medicines. These may include: Medicines that help keep you awake and alert (stimulants) to fight daytime sleepiness. Medicines that treat depression (antidepressants). These may be used to treat cataplexy. Sodium oxybate. This is a strong medicine to help you relax (sedative) that you may take at night. It can help control daytime sleepiness and cataplexy. Other treatments may include mental health counseling or joining a support group. Follow these instructions at home: Sleeping habits  Get about 8 hours of sleep every night. Go to sleep and get up at about the same time every day. Keep your bedroom dark, quiet, and comfortable. When you feel very tired, take short naps. Schedule naps so that you take them at about the same time every day. Before bedtime: Avoid bright lights and screens. Relax. Try activities like reading or taking a warm bath. Activity Get at least 20 minutes of exercise every day. This will help you sleep better at night and reduce daytime sleepiness. Avoid exercising within 3 hours of bedtime. Do not drive or use machinery if you are sleepy. If possible, take a nap before driving. Do not swim or go out on the water without a life jacket. Eating and drinking Do not drink alcohol or caffeinated beverages within 4-5 hours of bedtime. Do not eat a large meal before bedtime. Eat meals at about the same times every day. General instructions  Take over-the-counter and prescription medicines only as told by your health care provider. Keep a sleep diary as told by your health care provider. Tell your employer or teachers that you have narcolepsy. You may be able to adjust your schedule to include time for naps. Do not use any products that contain nicotine or tobacco. These  products include cigarettes, chewing tobacco, and vaping devices, such as e-cigarettes. If you need help quitting, ask your health care provider. Where to find more information [employer] of Neurological Disorders and Stroke: [email or web address] Contact a health care provider if: Your symptoms are not getting better. You have fast or irregular heartbeats (palpitations). You are having a hard time determining what is real and what is not (psychosis). Get help right away if: You hurt yourself during a sleep attack or an attack of cataplexy. You have chest pain. You have trouble breathing. These symptoms may be an emergency. Get help right away. Call 911. Do not wait to see if the symptoms will go away. Do not drive yourself to the hospital. Summary Narcolepsy is a disorder that causes people to fall asleep suddenly and without control during the daytime (sleep attacks). Narcolepsy is a lifelong disorder with no cure. Treatment can help relieve symptoms. Go to sleep and get up at about the same time every day. Follow instructions about sleep and activities as told by your health care provider. Take over-the-counter and prescription medicines only as told by your health care provider. This information is not intended to replace advice given to you by your health care provider. Make sure you discuss any questions you have with your health care provider. Document Revised: 09/13/2021 Document Reviewed: 09/13/2021  Elsevier Patient Education  2023 [employer] Hypersomnia Hypersomnia is a condition in which a person feels very tired during the day even though the person gets plenty of sleep at night. A person with this condition may take naps during the day and may find it very difficult to wake up from sleep. Hypersomnia may affect a person's ability to think, concentrate, drive, or remember things. What are the causes? The cause of this condition may not be known. Possible causes  include: Taking certain medicines. Using drugs or alcohol. Sleep disorders, such as narcolepsy and sleep apnea. Injury to the head, brain, or spinal cord. Tumors. Certain medical conditions. These include: Depression. Diabetes. Gastroesophageal reflux disease (GERD). An underactive thyroid gland (hypothyroidism). What are the signs or symptoms? The main symptoms of hypersomnia include: Feeling very tired throughout the day, regardless of how much sleep you got the night before. Having trouble waking up. Others may find it difficult to wake you up when you are sleeping. Sleeping for longer and longer periods at a time. Taking naps throughout the day. Other symptoms may include: Feeling restless, anxious, or annoyed. Lacking energy. Having trouble with: Remembering. Speaking. Thinking. Loss of appetite. Seeing, hearing, tasting, smelling, or feeling things that are not real (hallucinations). How is this diagnosed? This condition may be diagnosed based on: Your symptoms and medical history. Your sleeping habits. Your health care provider may ask you to write down your sleeping habits in a daily sleep log, along with any symptoms you have. A series of tests that are done while you sleep (sleep study or polysomnogram). A test that measures how quickly you can fall asleep during the day (daytime nap study or multiple sleep latency test). How is this treated? This condition may be treated by: Following a regular sleep routine. Making lifestyle changes, such as changing your eating habits, getting regular exercise, and avoiding alcohol or caffeinated beverages. Taking medicines to make you more alert (stimulants) during the day. Treating any underlying medical causes of hypersomnia. Follow these instructions at home: Sleep habits Stick to a routine that includes going to bed and waking up at the same times every day and night. Practice a relaxing bedtime routine. This may include  reading, meditation, deep breathing, or taking a warm bath before going to sleep. Exercise regularly as told by your health care provider. However, avoid exercising in the hours right before bedtime. Keep your sleep environment at a cooler temperature, darkened, and quiet. Sleep with pillows and a mattress that are comfortable and supportive. Schedule short 20-minute naps for when you feel sleepiest during the day. Talk with your employer or teachers about your hypersomnia. If possible, adjust your schedule so that: You have a regular daytime work schedule. You can take a scheduled nap during the day. You do not have to work or be active at night. Do not eat a heavy meal for a few hours before bedtime. Eat your meals at about the same times every day. Safety  Do not drive or use machinery if you are sleepy. Ask your health care provider if it is safe for you to drive. Wear a life jacket when swimming or spending time near water. General instructions  Take over-the-counter and prescription medicines only as told by your health care provider. This includes supplements. Avoid drinking alcohol or caffeinated beverages. Keep a sleep log that will help your health care provider manage your condition. This may include information about: What time you go to bed each night. How often  you wake up at night. How many hours you sleep at night. How often and for how long you nap during the day. Any observations from others, such as leg movements during sleep, sleep walking, or snoring. Keep all follow-up visits. This is important. Contact a health care provider if: You have new symptoms. Your symptoms get worse. Get help right away if: You have thoughts about hurting yourself or someone else. Get help right away if you feel like you may hurt yourself or others, or have thoughts about taking your own life. Go to your nearest emergency room or: Call 911. Call the National Suicide Prevention Lifeline  at 778-849-2142 or 988. This is open 24 hours a day. Text the Crisis Text Line at 361 634 6160. Summary Hypersomnia refers to a condition in which you feel very tired during the day even though you get plenty of sleep at night. A person with this condition may take naps during the day and may find it very difficult to wake up from sleep. Hypersomnia may affect a person's ability to think, concentrate, drive, or remember things. Treatment may include a regular sleep routine and making some lifestyle changes. This information is not intended to replace advice given to you by your health care provider. Make sure you discuss any questions you have with your health care provider. Document Revised: 04/22/2021 Document Reviewed: 04/22/2021 Elsevier Patient Education  2023 [employer].

## 2022-10-24 LAB — NARCOLEPSY EVALUATION
DQA1*01:02: NEGATIVE
DQB1*06:02: NEGATIVE

## 2022-10-28 ENCOUNTER — Encounter: Payer: 59 | Admitting: Licensed Clinical Social Worker

## 2022-11-04 ENCOUNTER — Telehealth: Payer: Self-pay

## 2022-11-04 NOTE — Telephone Encounter (Signed)
I spoke with the patient and provided the results of the HLA bio-marker test. She verbalized understanding of the findings and expressed appreciation for the call. All questions answered.

## 2022-11-04 NOTE — Telephone Encounter (Signed)
-----   Message from Melvyn Novas, MD sent at 11/03/2022  8:47 AM EDT ----- Negative for HLA bio-markers of narcolepsy.

## 2022-11-11 ENCOUNTER — Telehealth: Payer: Self-pay | Admitting: Neurology

## 2022-11-11 NOTE — Telephone Encounter (Signed)
NPSG/MSLT UHC medicare/medicaid no auth req    [employer]

## 2022-12-04 ENCOUNTER — Ambulatory Visit (INDEPENDENT_AMBULATORY_CARE_PROVIDER_SITE_OTHER): Payer: 59 | Admitting: Obstetrics and Gynecology

## 2022-12-04 ENCOUNTER — Telehealth: Payer: Self-pay | Admitting: *Deleted

## 2022-12-04 ENCOUNTER — Encounter: Payer: Self-pay | Admitting: Obstetrics and Gynecology

## 2022-12-04 ENCOUNTER — Other Ambulatory Visit: Payer: Self-pay

## 2022-12-04 ENCOUNTER — Telehealth: Payer: Self-pay | Admitting: Physician Assistant

## 2022-12-04 VITALS — BP 115/72 | HR 91 | Ht 66.0 in | Wt 138.1 lb

## 2022-12-04 DIAGNOSIS — Z3201 Encounter for pregnancy test, result positive: Secondary | ICD-10-CM

## 2022-12-04 LAB — POCT PREGNANCY, URINE: Preg Test, Ur: POSITIVE — AB

## 2022-12-04 NOTE — Telephone Encounter (Signed)
Pt is confused about what to take with her pregnancy, ibgard, miralax, and benefiber together?
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
## 2022-12-04 NOTE — Telephone Encounter (Signed)
See note below and advise. 

## 2022-12-04 NOTE — Telephone Encounter (Signed)
Patient pregnant, will stop Linzess, will start with MiraLAX 17 g twice daily, fiber, can do IBgard as needed for discomfort. Sent patient MyChart message.

## 2022-12-04 NOTE — Telephone Encounter (Signed)
Patient called states she just found out she is pregnant and her doctor told her she would need to stop Linzess or have it changed. Please advise.

## 2022-12-09 ENCOUNTER — Inpatient Hospital Stay (HOSPITAL_COMMUNITY)
Admission: AD | Admit: 2022-12-09 | Discharge: 2022-12-09 | Disposition: A | Discharge status: Home / Self Care | Payer: 59 | Attending: Obstetrics and Gynecology | Admitting: Obstetrics and Gynecology

## 2022-12-09 ENCOUNTER — Telehealth: Payer: Self-pay

## 2022-12-09 ENCOUNTER — Inpatient Hospital Stay (HOSPITAL_COMMUNITY): Payer: 59

## 2022-12-09 ENCOUNTER — Other Ambulatory Visit: Payer: Self-pay

## 2022-12-09 ENCOUNTER — Encounter (HOSPITAL_COMMUNITY): Payer: Self-pay | Admitting: Obstetrics and Gynecology

## 2022-12-09 DIAGNOSIS — R1031 Right lower quadrant pain: Secondary | ICD-10-CM | POA: Diagnosis not present

## 2022-12-09 DIAGNOSIS — O26891 Other specified pregnancy related conditions, first trimester: Secondary | ICD-10-CM | POA: Insufficient documentation

## 2022-12-09 DIAGNOSIS — O26851 Spotting complicating pregnancy, first trimester: Secondary | ICD-10-CM | POA: Insufficient documentation

## 2022-12-09 DIAGNOSIS — K219 Gastro-esophageal reflux disease without esophagitis: Secondary | ICD-10-CM | POA: Insufficient documentation

## 2022-12-09 DIAGNOSIS — K581 Irritable bowel syndrome with constipation: Secondary | ICD-10-CM | POA: Diagnosis not present

## 2022-12-09 DIAGNOSIS — Z3A01 Less than 8 weeks gestation of pregnancy: Secondary | ICD-10-CM | POA: Insufficient documentation

## 2022-12-09 DIAGNOSIS — O209 Hemorrhage in early pregnancy, unspecified: Secondary | ICD-10-CM

## 2022-12-09 DIAGNOSIS — O99611 Diseases of the digestive system complicating pregnancy, first trimester: Secondary | ICD-10-CM | POA: Insufficient documentation

## 2022-12-09 DIAGNOSIS — R197 Diarrhea, unspecified: Secondary | ICD-10-CM | POA: Insufficient documentation

## 2022-12-09 DIAGNOSIS — R1032 Left lower quadrant pain: Secondary | ICD-10-CM | POA: Diagnosis not present

## 2022-12-09 LAB — CBC
HCT: 39.5 % (ref 36.0–46.0)
Hemoglobin: 12.5 g/dL (ref 12.0–15.0)
MCH: 23.7 pg — ABNORMAL LOW (ref 26.0–34.0)
MCHC: 31.6 g/dL (ref 30.0–36.0)
MCV: 75 fL — ABNORMAL LOW (ref 80.0–100.0)
Platelets: 351 10*3/uL (ref 150–400)
RBC: 5.27 MIL/uL — ABNORMAL HIGH (ref 3.87–5.11)
RDW: 13.5 % (ref 11.5–15.5)
WBC: 4.8 10*3/uL (ref 4.0–10.5)
nRBC: 0 % (ref 0.0–0.2)

## 2022-12-09 LAB — URINALYSIS, ROUTINE W REFLEX MICROSCOPIC
Bacteria, UA: NONE SEEN
Bilirubin Urine: NEGATIVE
Glucose, UA: NEGATIVE mg/dL
Ketones, ur: NEGATIVE mg/dL
Leukocytes,Ua: NEGATIVE
Nitrite: NEGATIVE
Protein, ur: NEGATIVE mg/dL
Specific Gravity, Urine: 1.001 — ABNORMAL LOW (ref 1.005–1.030)
pH: 7 (ref 5.0–8.0)

## 2022-12-09 LAB — WET PREP, GENITAL
Clue Cells Wet Prep HPF POC: NONE SEEN
Sperm: NONE SEEN
Trich, Wet Prep: NONE SEEN
WBC, Wet Prep HPF POC: <10 (ref <10)
Yeast Wet Prep HPF POC: NONE SEEN

## 2022-12-09 LAB — HIV ANTIBODY (ROUTINE TESTING W REFLEX): HIV Screen 4th Generation wRfx: NONREACTIVE

## 2022-12-09 LAB — HCG, QUANTITATIVE, PREGNANCY: hCG, Beta Chain, Quant, S: 20030 m[IU]/mL — ABNORMAL HIGH (ref <5)
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
## 2022-12-09 NOTE — Telephone Encounter (Signed)
Patient called office to report single episode of spotting this morning. Reviewed bleeding precautions. Pt then expresses she has been having abdominal pain for the past few days. Recommended patient go to MAU for evaluation.

## 2022-12-09 NOTE — MAU Note (Signed)
Veronica Buchanan is a 27 y.o. at [redacted]w[redacted]d here in MAU reporting: she's began having spotting with wiping that began today and abdominal pain that began 1 week ago.  States pain is constant , sharp & stabbing, worse when lying down.  States was instructed to take Tylenol, last taken 1 hour ago. LMP: 10/31/2022 Onset of complaint: today Pain score: 8 Vitals:   12/09/22 1040  BP: (!) 103/58  Pulse: 90  Resp: 20  Temp: 98 F (36.7 C)  SpO2: 100%     FHT:NA Lab orders placed from triage:   UA

## 2022-12-10 LAB — ABO/RH: ABO/RH(D): B POS

## 2022-12-10 LAB — GC/CHLAMYDIA PROBE AMP (~~LOC~~) NOT AT ARMC
Chlamydia: NEGATIVE
Comment: NEGATIVE
Comment: NORMAL
Neisseria Gonorrhea: NEGATIVE

## 2022-12-18 ENCOUNTER — Other Ambulatory Visit: Payer: Self-pay

## 2022-12-18 ENCOUNTER — Ambulatory Visit (INDEPENDENT_AMBULATORY_CARE_PROVIDER_SITE_OTHER): Payer: 59

## 2022-12-18 DIAGNOSIS — N809 Endometriosis, unspecified: Secondary | ICD-10-CM

## 2022-12-18 DIAGNOSIS — J4530 Mild persistent asthma, uncomplicated: Secondary | ICD-10-CM

## 2022-12-18 DIAGNOSIS — O0990 Supervision of high risk pregnancy, unspecified, unspecified trimester: Secondary | ICD-10-CM | POA: Insufficient documentation

## 2022-12-18 DIAGNOSIS — Z3A01 Less than 8 weeks gestation of pregnancy: Secondary | ICD-10-CM

## 2022-12-18 DIAGNOSIS — O0991 Supervision of high risk pregnancy, unspecified, first trimester: Secondary | ICD-10-CM

## 2022-12-18 MED ORDER — PROMETHAZINE HCL 25 MG PO TABS
25.0000 mg | ORAL_TABLET | Freq: Four times a day (QID) | ORAL | 0 refills | Status: DC | PRN
Start: 2022-12-18 — End: 2023-01-15

## 2022-12-18 NOTE — Progress Notes (Signed)
New OB Intake  I connected with Veronica Buchanan  on 12/18/22 at  3:15 PM EDT in person and verified that I am speaking with the correct person using two identifiers. Nurse is located at [facility] and pt is located at [employer].  I discussed the limitations, risks, security and privacy concerns of performing an evaluation and management service by telephone and the availability of in person appointments. I also discussed with the patient that there may be a patient responsible charge related to this service. The patient expressed understanding and agreed to proceed.  I explained I am completing New OB Intake today. We discussed EDD of 08/07/2023, by Last Menstrual Period. Pt is G1P0000. I reviewed her allergies, medications and Medical/Surgical/OB history.    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. Discussed the nature of our practice with multiple providers including residents and students. Due to the size of the practice, the delivering provider may not be the same as those providing prenatal care.   MyChart/Babyscripts MyChart access verified. I explained pt will have some visits in office and some virtually. Babyscripts instructions given and order placed. Patient verifies receipt of registration text/e-mail. Account successfully created  and app downloaded.  Blood Pressure Cuff/Weight Scale Patient has private insurance; instructed to purchase blood pressure cuff and bring to first prenatal appt. Explained after first prenatal appt pt will check weekly and document in Babyscripts.   Anatomy US Please schedule anatomy US @ new ob appointment  Is patient a CenteringPregnancy candidate?  Accepted If accepted,    Is patient a Mom+Baby Combined Care candidate?  Accepted centering    If accepted, confirm patient does not intend to move from the area for at least 12 months, then notify Mom+Baby staff  Interested in Doula? If yes, send referral and doula dot phrase.    First visit review I reviewed new OB appt with patient. Explained pt will be seen by DO Cherlynn Polo- Lott at first visit. Discussed Avelina Laine genetic screening with patient.  Panorama and Horizon.. Routine prenatal labs is needed at new ob appointment.  Last Pap Diagnosis  Date Value Ref Range Status  03/10/2022   Final   - Negative for intraepithelial lesion or malignancy (NILM)    Veronica Buchanan, CMA 12/18/2022  5:00 PM

## 2022-12-18 NOTE — Patient Instructions (Signed)
Safe Medications in Pregnancy   Acne:  Benzoyl Peroxide  Salicylic Acid   Backache/Headache:  Tylenol: 2 regular strength every 4 hours OR               2 Extra strength every 6 hours   Colds/Coughs/Allergies:  Benadryl (alcohol free) 25 mg every 6 hours as needed  Breath right strips  Claritin  Cepacol throat lozenges  Chloraseptic throat spray  Cold-Eeze- up to three times per day  Cough drops, alcohol free  Flonase (by prescription only)  Guaifenesin  Mucinex  Robitussin DM (plain only, alcohol free)  Saline nasal spray/drops  Sudafed (pseudoephedrine) & Actifed * use only after [redacted] weeks gestation and if you do not have high blood pressure  Tylenol  Vicks Vaporub  Zinc lozenges  Zyrtec   Constipation:  Colace  Ducolax suppositories  Fleet enema  Glycerin suppositories  Metamucil  Milk of magnesia  Miralax  Senokot  Smooth move tea   Diarrhea:  Kaopectate  Imodium A-D   *NO pepto Bismol   Hemorrhoids:  Anusol  Anusol HC  Preparation H  Tucks   Indigestion:  Tums  Maalox  Mylanta  Zantac  Pepcid   Insomnia:  Benadryl (alcohol free) 25mg every 6 hours as needed  Tylenol PM  Unisom, no Gelcaps   Leg Cramps:  Tums  MagGel   Nausea/Vomiting:  Bonine  Dramamine  Emetrol  Ginger extract  Sea bands  Meclizine  Nausea medication to take during pregnancy:  Unisom (doxylamine succinate 25 mg tablets) Take one tablet daily at bedtime. If symptoms are not adequately controlled, the dose can be increased to a maximum recommended dose of two tablets daily (1/2 tablet in the morning, 1/2 tablet mid-afternoon and one at bedtime).  Vitamin B6 100mg tablets. Take one tablet twice a day (up to 200 mg per day).   Skin Rashes:  Aveeno products  Benadryl cream or 25mg every 6 hours as needed  Calamine Lotion  1% cortisone cream   Yeast infection:  Gyne-lotrimin 7  Monistat 7    **If taking multiple medications, please check labels to avoid  duplicating the same active ingredients  **take medication as directed on the label  ** Do not exceed 4000 mg of tylenol in 24 hours  **Do not take medications that contain aspirin or ibuprofen            [place] Pediatric Providers  [place] ([address]) [facility] Brown, MD; Chambliss, MD; Eniola, MD; Hensel, MD; McDiarmid, MD; McIntyer, MD [address] [place], [place] [address] [phone] Mon-Fri 8:30-12:30, 1:30-5:00  Providers come to see babies during newborn hospitalization Only accepting infants of Mother's who are seen at Family Medicine Center or have siblings seen at   [facility] Center Medicaid - Yes; Tricare - Yes   [facility] Mulberry, MD [address] [place], [place] [address] [phone] Mon, Tue, Thur, Fri 8:30-5:00, Wed 10:00-7:00 (closed 1-2pm daily for lunch) Takes [place] residents with no insurance.  [place] [facility] only with Medicaid/insurance; Tricare - no  [facility] Ben-Davies, MD; Brown, MD; Chandler, MD; Ettefagh, MD; Grant, MD; Hanvey, MD; Herrin, MD; Jones,  MD; Lester, MD; McCormick, MD; McQueen, MD; Simha, MD; Stanley, MD; Stryffeler, NP [address], [place],  [address] [phone] Mon, Tue, Thur, Fri 8:30-5:30, Wed 9:30-5:30, Sat 8:30-12:30 Only accepting infants of first-time parents or siblings of current   patients Hospital discharge coordinator will make follow-up appointment Medicaid - yes; Tricare - yes  [place] ([address])  [facility] Cox, MD; Davis, MD; Dovico, MD; Ettefaugh, MD; Lowe, MD; Nation, MD; Slimp, MD; Sumner, MD; Williams, MD [address], [place], [place] [address] [phone] Mon-Fri 8:30-5:00, closed for lunch 12:30-1:30; Sat-Sun 10:00-1:00 Accepting Newborns with commercial insurance only, must call prior to  delivery to be accepted into  practice.  Medicaid - no, Tricare - yes   [facility] [address] [place], [place] [address] [phone] or [phone] Mon to Fri 8am to 10pm, Sat 8am to 1pm (virtual only on weekends) Only accepts Medicaid Healthy Blue pts  [facility]  Artis, MD; Coccaro, MD; Lockett Gardner, MD; Netherton, NP; Roper, MD; Wilmot, PA-C; Skinner, MD [address] [place], [place] [address] [phone] Mon-Fri 8:30-5:30 Medicaid - yes, Tricare - yes  [place] ([address]) [facility] Warner, MD [address], [place], [place] [address] [phone] Mon, Tues, Wed Fri 8:30-5:00, Sat 8:30-12:00, Closed Thursdays Accepting siblings of established patients and first time mom's if you call prenatally Medicaid- yes; Tricare - yes  [facility] Becker, PA; Hagler, MD; Quinn, PA-C; Scifres, PA; Sun, MD; Swayne, MD;  [address], [place], [place] [address] [phone] Mon-Fri 8:30-5:00, closed for lunch 1-2 Only accepting newborns of established patients Medicaid- no; Tricare - yes  [place] ([address]) [facility] Timberlake, MD; [address], [place], [place] [address] [phone] Mon-Fri 8:00-5:00 Medicaid - No; Tricare - Yes  [facility] [employer]  Brake, NP; Wharton, PA [address], [place], [place] [address] [phone] Mon-Fri 8:00-5:00 Medicaid - No, Tricare - Yes  [facility] Gay, MD; Quinlan, MD; Blatt, DNP [address] [place], [place] [address] [phone]  Mon-Fri 8:00-5:00 Medicaid - No; Tricare - Yes  [facility] [address] [place], [place] [address] [phone] Mon-Fri 8:30-5:00 (lunch 12:00-1:00) Medicaid -Yes; Tricare - Yes  [facility] Jordan, MD [address],  [place], [place] [address] [phone] Mon-Fri 8:00-5:00 Seeing newborns of current patients only. No new patients Medicaid - No, Tricare - yes  [facility] Parker, MD [address] [place], [place] [address] [phone] Mon-Fri 8:00-5:00 Medicaid -yes as secondary coverage only; Tricare - yes  [facility] Brecken, PA; Christy, NP; Dees, MD; DeClaire, MD; DeWeese, MD; Hodge, PA; Smoot, NP; Summer, MD; Vapne, MD [address] [place], [place] [address] [phone] Mon-Fri 8:30-5:00, Sat 9:00-11:00 Accepts commercial insurance ONLY. Offers free prenatal information sessions for families. Medicaid - No, Tricare - Call first  [facility] Bouska, MD; Gordon, PA; Jeffery, PA; Weber, PA [address] [place] [address] [phone] Mon-Fri 7:30-5:30 Medicaid - Yes; Tricare - yes  [place] ([address] & [address])  [facility] Reese, MD [address] [place], [place] [address] [phone] Mon-Thur 8:00-6:00, closed for lunch 12-2, closed Fridays Medicaid - yes; Tricare - no  [facility] Anderson, NP; Badger, MD; Beal, PA; Spencer, PA [address], [place], [place] [address] [phone] Mon-Fri 7:30-4:30 Medicaid - yes, Tricare - yes  [facility]  Agbuya, MD; Klett, NP; Romgoolam, MD; Rothstein, NP [address], , [place] [address] [phone] Mon-Fri 8:30-5:00, closed for lunch 1-2, Sat 8:30-12:00 - sick visits only Providers come to see   babies at [facility] Only accepting newborns of siblings and first time parents ONLY if who have met with office prior to delivery Medicaid -Yes; Tricare - yes  [facility] [place]  Golden, DO; Friddle, NP; Wallace, MD; Wood, MD:  [address], [place], [place] [address] [phone] Mon- Fri 8:00-5:00, Sat 9:00-12:00 - sick  visits only Accepting siblings of established patients and first time mom/baby Medicaid - Yes; Tricare - yes Patients must have vaccinations (baby vaccines)  [place] ([address] & [address])  [facility] [facility] [address] [place], [place] [address] [phone] Mon-Fri 8:00-5:00 Medicaid - no; Tricare - yes  [facility] Briscoe, MD; Schmidt, PA; Moreira, PA [address], [place], [place] [address] [phone] Mon-Fri 8:00-5:00 Medicaid- yes; Tricare - yes  [facility] Boyd, MD; Jones, NP; Osborn, PA [address], [place], [place] [address] [phone] Mon-Fri 8:00-5:00 Medicaid - Yes; Tricare - yes  [place] ([address])  [facility] Atkinson, PA; Calderon, PA; Cummings, MD; Dillard, MD; Henrish, NP; Isenhour, DO; Martin, PA; Olson, MD; Ott, MD; Phillips, MD; Valente, PA; VanDeven, PA; Yonjof, NP [address], [place], [place] [address] [phone] Mon-Fri 8:30-5:00, Sat 9:00-12:00 - sick only Please register online [email or web address] then schedule online or call office Medicaid-Yes; Tricare -yes  [facility]  Dabrusco, MD; Dial, MD; Koochiching, MD; Fleenor, NP; Goolsby, PA; Tonuzi, MD; Turner, NP; West, MD [address], [place], [place] [address] [phone] Mon-Fri 8:00-5:30, Sat&Sun by appointment (phones open at 8:30) Medicaid - Yes; Tricare - yes  [place] ([address]) [facility] Allen, CPNP; Bates, MD; Gordon, MD; Mills, NP; Weinshilboum, DO [address], [place], [place] [address] [phone] M-F 8:00 - 5:15, Sat/Sun 9-12 sick visits only Medicaid - No; Tricare - yes  [facility] [place] [facility]  Brown, PA-C; Cowen, PA-C; Dennis, DO; Fuster, PA-C; Martin, PA-C; Shelton,  PA-C; Spry, MD [address] [place], [place] [address] [phone] Mon-Thur 8:00-7:00, Fri 8:00-5:00 Accepting Medicaid for 13 and under only   [facility] [place] [facility] [place] Hayes, FNP; List, FNP; Moran, MD; Pitonzo, PA-C; Scholer, MD; Spangle, FNP; Nzenwa, FNP; Jasper, MD; Moran, MD [address] [place], [place] [address] [phone] Mon-Fri 8:30-5:30 Medicaid - Yes; Tricare - yes  [facility] [address]  Kelly, CPNP; Logan, MD; Poth, MD; Ramadoss, MD; Staton, NP [address], [place], [place] [address] [phone] Mon-Thur 8:00-5:30, Fri 8:00-5:00, Sat 9:00-12:00 Medicaid - yes, Tricare - yes  [place] ([address])  [facility] [place] Masneri, DO; Meyers, MD; Nelson, PA [address], [place], [place] [address] [phone] Mon-Fri 8:00-5:00, closed for lunch 12-1 Medicaid - No; Tricare - yes  [facility] [place] McGowen, MD [address], [place], [place] [address] [phone] Mon-Fri 8:00-5:00 Medicaid - No; Tricare - yes  [facility] [place] MacDonald, MD; Nayak, MD; Kearns, MD; Jones, MD [address] [place] [address], [place], [place] [address] [phone] Mon-Fri 8:00-5:00 Medicaid- Yes; Tricare - yes  [place] ([address])  [facility] [place] Martin, PA-C; Tabori, MD [address], [place],  [address] [phone] Mon-Fri 8:00-5:00 Medicaid - No; Tricare - yes  [facility] [place]  Margin - CPNP [address], [place], [place] [address] [phone] Mon-Weds 8:00-6:00, Thurs-Fri 8:00-5:00, Sat 9:00-12:00 Medicaid - yes; Tricare - yes   [facility] [place] Aubuchon, MD; Brandon, PA [address] [place], [place] [address] [phone] Mon-Fri 8:00-5:00 Medicaid - yes; Tricare - yes  [place]  Pediatric Providers  [facility] [address], [place], [place] [address] [phone] M, Thur: 8am -8pm, Tues, Weds: 8am - 5pm; Fri: 8-1 Medicaid - Yes; Tricare - yes  [facility] Mertz, MD; Johnson, MD; Wells, MD; Downs, PA; Hockenberger, PA [address], [place], [place] [address] [phone] M-F 8:30 - 5:00 Medicaid - Call office; Tricare -yes  [facility] Bonney, MD; Page, MD, Minter, MD; Mueller, PNP; Thomason, NP [address], [place], [place] [address] [phone] M-F 8:30 - 5:00, Sat/Sun 8:30 - 12:30 (sick visits) Medicaid - Call office; Tricare -yes  [facility] Lewis, MD; Shaub, PNP; Boylston, MD; Quaile, PA; Nonato, NP; Landon, CPNP [address], [place], [place] [address] [phone] M-F 8:30 - 5:00 Medicaid - Call office; Tricare - yes  [facility] [place] Cline, MD; Dvergsten, MD; Flores, MD; Kawatu, MD; Nogo, MD [address], [place], [place] [address] [phone] M-Thur: 8:00 - 5:00; Fri: 8:00 - 4:00 Medicaid - yes; Tricare - yes  [facility] [address] [place], [place], [place] [address] [phone] M-F: 8:30- 5:00, closed for lunch 12:30 - 1:00 Medicaid - yes; Tricare -yes  [facility] [place] [address], [place], [place] [address] [phone] M-F 8:00 - 5:00 Medicaid - yes; Tricare - yes  [facility] Johnson, DO; Rumball, DO; Wicker, NP [address] [place], [place], [place] [address] [phone] M-F 8:00 - 5:00, Closed 12-1 for lunch Medicaid - Call; Tricare - yes  [facility] Stein, MD [address], [place], [place] [address] [phone] M-F: 8:00-5:00, Sat: 8:00 - noon Medicaid - call; Tricare -yes  [place] Pediatric Providers  [facility] Collins, FNP-C [address], [place], [place] [address] [phone] M-W: 8:00-5:00, Thur: 8:00 -  7:00, Fri: 8:00 - noon Medicaid - yes; Tricare - yes  [facility] [place] Adams, FNP [address], [place], [place] [address] [phone] M-F 8:00 - 5:00, Closed for lunch 12-1 Medicaid - yes; Tricare - yes  [place] Pediatric Providers  [facility] Smith, FNP, Melvin, MD, Fay, FNP-C [address] [place] [address] [place], [place], [place] [address] [phone] M-T 8:00-5:00, Wed-Fri 7:00-6:00 Medicaid - Yes; Tricare -yes  [facility] [place] Civiletti, DO; [address] [place] [address], [place], [place] [address] [phone] M-F 8:00 - 5:00, closed for lunch 12-1 Medicaid - Yes; Tricare - yes  [facility]  Barnes, MD; Bergdolt, MD; Caulfield, MD; Emrich, MD; Fiscus, MD; Hoppens, MD; Kylstra, MD, McPherson, MD; Todd, MD; Prestwood, MD; Waters, MD; Wood, MD [address], [place], [place] [address] [phone] M-F 8:00-5:00 Medicaid - yes; Tricare - yes  [facility] Cheema, MD (speaks Punjabi and Hindi) [address] [place],  [address] [phone] M-F: 8:30 - 5:00, closed 12:30 - 1 for lunch Medicaid - Yes; Tricare -yes  [place] Pediatric Providers  [facility] Loda, MD; Timberlake, MD; Burke,   MD [address], [place] [address] [phone] M-Th: 8:00 - 5:30, Fri: 8:00 - 12:00 Medicaid - yes; Tricare - yes  [facility] [place] Lookabill, NP; Meier, MD; Daffron, MD [address], [place], [place] [address] [phone] M-F: 8:00 - 5:00 Medicaid - yes; Tricare - yes  [facility] Busse, NP; Bowman, NP; Baune, NP; Entwistle, MD; Williams, MD, Huffman, NP, Ferguson, MD; Patel, DO [address], [place], [place] [address] [phone] M-F: 8:30 - 5:30p Medicaid - yes; Tricare - yes Other locations available as well  [place] [facility] Rajan, MD; Wilson, MD; Morgan, PA-C, Domenech, PA-C; Myers, PA-C [address], [place], [place] [address] [phone] M-W: 8:00am - 7:00pm, Thurs: 8:00am - 8:00pm; Fri: 8:00am - 5:00pm, closed daily from 12-1 for lunch Medicaid - yes; Tricare - yes  [place] [facility] Providers  [facility] [place] Adams, MD; Crystal, FNP; Hadley, MD; Stokes, MD; Johnson, PNP; Brady, PA-C; West, PNP; Gardner, MD;  [address], [place], [place] [address] [phone] M - Fri: 8am - 5pm, Sat 9-noon Medicaid - Yes; Tricare -yes  [facility] [place] Nayak, MD; Jones, FNP; McDonald, MD; Kearns, MD [address] [place] [address], [place], [place] [phone] M-F 8:00 - 5:00 Medicaid - call; Tricare - yes  [facility] [place] Bell, MD; Emory, PNP; Pinder, MD; Anderson, MD; Light, PA-C; Johnson, MD; Latta, MD; Saul, PNP; Rainey, MD; Clifford, MD; McClung, MD [address], [place], [place] [address] [phone] M-F 8:00am - 5:00pm; Sat. 9:00 - 11:00 Medicaid - yes; Tricare - yes  [facility] [place] Soldato-Couture, MD [address], [place], [place] [address] [phone] M-F 8:00 - 5:00 Medicaid - [place] Medicaid only; Tricare - yes  [facility] [place] Walker, MD; Davis, PNP; Ajizian, MD [address] [place] [address], [place], [place] [address] [phone] M-F 8:00 - 5:00 Medicaid - yes; Tricare - yes  [facility] [place] Barry, MD; Brown, MD, Forest, MD, Hazek, MD; Hoyle, MD; Smith, MD; [address], [place], [place] [address] [phone] M-F: 8-5 Medicaid - yes; Tricare - yes  [facility] [place] Brady, Md; Dowlen, MD; [address] [place] [address], [place], [place] [address] [phone] M-F 8-5 Medicaid - yes; Tricare - yes  [facility] Kearns, MD; Nayak, MD; Soldato-Courture, MD; Pellam-Palmer, DNP;  Herring, PNP [address], , [place] [address] [phone] M-F 8-5 Medicaid - yes; Tricare - yes  [facility] Weathers, MD; Merritt, PA-C; Davis-PA-C; Warn[place], MD [address], [place], [place] [address] [phone] M-F 7am - 5 pm Medicaid - call; Tricare - yes  [facility] Hill, PNP; Erickson, MD; Robinson, MD [address], [place], [place] [address] [phone] M-F 8-5 Medicaid - yes; Tricare - yes  [facility] Kribbs, MD; Warner, MD; Williams, FNP; [place], FNP; Boles, FNP; Romblad, PA-C; [place] - FNP [address], [place], [place] [address] [phone] M-F 8-5 Medicaid- yes; Tricare - yes  [facility] [place] [place] Yoder, MD; Verenes, MD; Armentrout, MD; Stewart, MD; Beasley, CPNP; Ford, MD; Erickson, MD; Rice, MD [address], [place], [place] [address] [phone] M-F: 8-5, Sat: 9-4, Sun 9-12 Medicaid - yes; Tricare - yes  [facility] Little, PNP; Davis, PNP [address], [place],   [place] [address] [phone] M-F 8 - 5, closed 12-1 for lunch Medicaid - yes; Tricare - yes  [facility] Friesen, MD; Cnegia, MD; Rice, MD; Patel, DO [address], [place], [place] [address] [phone] M-F 8- 5:30 Medicaid - yes; Tricare - yes  [facility] [place] Zvolensky, MD; Ray, MD; Haas, MD [address] [address], [place], [place] [address] [phone] M: 8-7; Tues-Fri: 8-5; Sat: 9-12 Medicaid - yes; Tricare - yes  [facility] [place] Heinrich, MD; Meyer, MD; Clark, MD; Rhyne, MD; Aubuchon, MD [address], [place], [place] [address] [phone] M: 8-7; Tues-Fri: 8-5; Sat: 8:30-12:30 Medicaid - yes;  Tricare - yes  [facility] [place] Bista, MD; Dillard, PA [address], [place], [place] [address] [phone] Mon-Fri: 8-5 Medicaid - yes; Tricare - yes  [facility] [place] Beasley, CPNP; Mahle, CPNP; Rice, MD; Duffy, MD; Culler, MD; [address], [place], [place] [address] [phone] M-F: 8-5, closed 1-2 for lunch Medicaid - yes; Tricare - yes  [facility] Rickman, PA; Mounce, NP; Smith, MD; Jordan, CPNP; Darty, PA; Ball, MD; Wallace, MD [address] [place] [address], [place], [place] [address] [phone] M-Thurs: 8-7; Fri: 8-6; Sat: 9-12; Sun 2-4 Medicaid - yes; Tricare - yes  [facility] Brown, MD; Shin, MD; Goodman, DNP, FNP; Sebesta, DO; [address], [place], [place] [address] [phone] M-F: 8-5 Medicaid - yes; Tricare - yes  [place] Pediatric Providers  [facility] Dough, MD; Welsh, NP [address], [place], [place] [address] [phone] M - Fri: 8am - 5pm, closed for lunch 12-1 Medicaid - Yes; Tricare - yes  [facility] Manandhar, MD; Riley, MD; Sanger, DO; Vinocur, MD;Hall, PA; Walsh, PA; Campbell, NP [address], [place] [address] [phone] M-F 8:00 - 5:00, Sat 8:00 - 11:30 Medicaid - yes; Tricare - yes  [facility] Khan, MD; Redding, MD, Street, MD, Holt, MD, Burgart, MD; Rhyne, NP; Dickinson, PA;  [address] [place] [address], [place], [place] [address] [phone] M-F 8:10am - 5:00pm Medicaid - yes; Tricare - yes  [facility] Connors, MD; Kime, NP [address], [place], [place] [address] [phone] M-F 8:00 - 5:00 Medicaid - [place] Medicaid only; Tricare - yes  [facility] [place] Whyte, MD; Fox, NP [address], [place], [place] [address] [phone] M-F 8:00 - 5:00; Closed for lunch 12 - 1:00 Medicaid - yes; Tricare - yes  [facility] Penner, MD; Wilburn, FNP [address], [place], [place] [address] [phone] Mon 9-5; Tues/Wed 10-5; Thurs 8:30-5; Fri: 8-12:30 Medicaid - yes; Tricare - yes  [place] Pediatric Providers  [facility]  Golding, MD; Jackson, PA-C [address], [place], [place] [address] [phone] phone [phone] fax M-F 7:15 - 4:30 Medicaid - yes; Tricare - yes  [facility] Gosrani, MD; Meccariello, DO [address] [place], [place] [address] [phone] M-Fri: 8:30 - 5:00, closed for lunch everyday noon - 1pm Medicaid - Yes; Tricare - yes  [facility] Burdine, MD; Daniel, MD; Howard, MD; Sasser, MD; Boles, PA; Boyd, PA-C; Carroll, PA; McGee, PA; Skillman,   PA; Wilson, PA [address] [place], [place] [address] [phone] M-Thurs: 7:30am - 7:00pm; Friday 7:30am - 4pm; Sat: 8:00 - 1:00 Medicaid - Yes; Tricare - yes  [facility] [place] Akhbari, MD; Law, MD; Qayumi, MD; Salvador, DO [address], [place], [place] [address] [phone] M-Thur: 8:00 - 5:00, Fri: 8:00 - Noon Medicaid - yes; Tricare - yes No [place] [employer]  [facility] Dettinger, MD; Gottschalk, DO; Hawks, NP; Martin, NP; Morgan, NP; Milian, NP; Rakes, NP; Stacks, MD; Webster, PA [address], [place], [place] [address] [phone] M-F 8:00 - 5:00 Medicaid - yes; Tricare - yes  [facility] Collins, FNP-C; Bucio, FNP-C [address], [place], [place] [address] [phone] M, W, R 8:00-5:00, Tues: 8:00am - 7:00pm; Fri 8:00 - noon Medicaid - Yes; Tricare - yes  [facility] Khan, MD [address] [place]  [address] [place], [place] [address] [phone]  M-Thurs 8:30-5:30, Fri: 8:30-12:30pm Medicaid - Yes; Tricare - N  

## 2022-12-22 ENCOUNTER — Encounter: Payer: Self-pay | Admitting: *Deleted

## 2022-12-24 ENCOUNTER — Encounter: Payer: Self-pay | Admitting: *Deleted

## 2022-12-25 ENCOUNTER — Encounter: Payer: Self-pay | Admitting: Family Medicine

## 2022-12-29 ENCOUNTER — Encounter: Payer: Self-pay | Admitting: Family Medicine

## 2023-01-05 ENCOUNTER — Encounter: Payer: Self-pay | Admitting: General Practice

## 2023-01-05 ENCOUNTER — Ambulatory Visit: Payer: 59

## 2023-01-05 ENCOUNTER — Encounter: Payer: Self-pay | Admitting: Obstetrics and Gynecology

## 2023-01-05 ENCOUNTER — Ambulatory Visit: Payer: 59 | Admitting: General Practice

## 2023-01-05 ENCOUNTER — Telehealth: Payer: Self-pay | Admitting: General Practice

## 2023-01-05 DIAGNOSIS — O3680X0 Pregnancy with inconclusive fetal viability, not applicable or unspecified: Secondary | ICD-10-CM

## 2023-01-05 DIAGNOSIS — Z3481 Encounter for supervision of other normal pregnancy, first trimester: Secondary | ICD-10-CM | POA: Diagnosis not present

## 2023-01-05 DIAGNOSIS — Z3A09 9 weeks gestation of pregnancy: Secondary | ICD-10-CM | POA: Diagnosis not present

## 2023-01-05 DIAGNOSIS — Z3401 Encounter for supervision of normal first pregnancy, first trimester: Secondary | ICD-10-CM

## 2023-01-05 LAB — POCT URINALYSIS DIP (DEVICE)
Bilirubin Urine: NEGATIVE
Glucose, UA: NEGATIVE mg/dL
Hgb urine dipstick: NEGATIVE
Ketones, ur: NEGATIVE mg/dL
Leukocytes,Ua: NEGATIVE
Nitrite: NEGATIVE
Protein, ur: NEGATIVE mg/dL
Specific Gravity, Urine: 1.025 (ref 1.005–1.030)
Urobilinogen, UA: 0.2 mg/dL (ref 0.0–1.0)
pH: 6.5 (ref 5.0–8.0)

## 2023-01-05 NOTE — Telephone Encounter (Signed)
Called patient regarding mychart message and asked for more information about her pain. Patient states she sometimes had a pain like this before pregnancy when she rolled over but this pain has been constant since last night rated at a 7. She states the pain is in her lower back/side and some mild pain in her lower abdomen/pelvis. Patient denies bleeding, dysuria, incomplete emptying, or flank pain. Offered ultrasound appt today just to check to make sure everything was looking okay and we could go from there. Patient verbalized understanding.

## 2023-01-05 NOTE — Progress Notes (Signed)
Patient presents to office today for viability ultrasound after constant back/side pain since last night rated at a 7- she also has mild pain in lower abdomen. Denies bleeding, dysuria, or incomplete emptying. Ultrasound was normal. Discussed with Dr Briscoe Deutscher who recommended UA to rule out blood in urine- UA negative. Per Dr Briscoe Deutscher, could offer Flexeril for pain. Discussed with patient who prefers to try at home relief measures. Recommended tylenol, heating pad on low, gentle stretching or muscle rub. Advised to call back if it worsens.  Chase Caller RN BSN 01/05/23

## 2023-01-15 ENCOUNTER — Ambulatory Visit (INDEPENDENT_AMBULATORY_CARE_PROVIDER_SITE_OTHER): Payer: 59 | Admitting: Obstetrics and Gynecology

## 2023-01-15 ENCOUNTER — Encounter: Payer: Self-pay | Admitting: Obstetrics and Gynecology

## 2023-01-15 ENCOUNTER — Other Ambulatory Visit: Payer: Self-pay

## 2023-01-15 VITALS — BP 107/73 | HR 80 | Wt 140.2 lb

## 2023-01-15 DIAGNOSIS — Z8659 Personal history of other mental and behavioral disorders: Secondary | ICD-10-CM

## 2023-01-15 DIAGNOSIS — Z3A10 10 weeks gestation of pregnancy: Secondary | ICD-10-CM | POA: Diagnosis not present

## 2023-01-15 DIAGNOSIS — Z8709 Personal history of other diseases of the respiratory system: Secondary | ICD-10-CM | POA: Diagnosis not present

## 2023-01-15 DIAGNOSIS — O0990 Supervision of high risk pregnancy, unspecified, unspecified trimester: Secondary | ICD-10-CM

## 2023-01-15 DIAGNOSIS — O0991 Supervision of high risk pregnancy, unspecified, first trimester: Secondary | ICD-10-CM

## 2023-01-15 MED ORDER — PROMETHAZINE HCL 25 MG PO TABS
25.0000 mg | ORAL_TABLET | Freq: Four times a day (QID) | ORAL | 1 refills | Status: DC | PRN
Start: 2023-01-15 — End: 2023-07-20
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
## 2023-01-16 LAB — CBC/D/PLT+RPR+RH+ABO+RUBIGG...
Antibody Screen: NEGATIVE
Basophils Absolute: 0 10*3/uL (ref 0.0–0.2)
Basos: 1 %
EOS (ABSOLUTE): 0.1 10*3/uL (ref 0.0–0.4)
Eos: 1 %
HCV Ab: NONREACTIVE
HIV Screen 4th Generation wRfx: NONREACTIVE
Hematocrit: 38.6 % (ref 34.0–46.6)
Hemoglobin: 12 g/dL (ref 11.1–15.9)
Hepatitis B Surface Ag: NEGATIVE
Immature Grans (Abs): 0 10*3/uL (ref 0.0–0.1)
Immature Granulocytes: 0 %
Lymphocytes Absolute: 1.7 10*3/uL (ref 0.7–3.1)
Lymphs: 29 %
MCH: 22.5 pg — ABNORMAL LOW (ref 26.6–33.0)
MCHC: 31.1 g/dL — ABNORMAL LOW (ref 31.5–35.7)
MCV: 72 fL — ABNORMAL LOW (ref 79–97)
Monocytes Absolute: 0.5 10*3/uL (ref 0.1–0.9)
Monocytes: 9 %
Neutrophils Absolute: 3.4 10*3/uL (ref 1.4–7.0)
Neutrophils: 60 %
Platelets: 337 10*3/uL (ref 150–450)
RBC: 5.34 x10E6/uL — ABNORMAL HIGH (ref 3.77–5.28)
RDW: 14.1 % (ref 11.7–15.4)
RPR Ser Ql: NONREACTIVE
Rh Factor: POSITIVE
Rubella Antibodies, IGG: 4.98 {index} (ref >0.99)
WBC: 5.7 10*3/uL (ref 3.4–10.8)

## 2023-01-16 LAB — HCV INTERPRETATION

## 2023-01-16 LAB — TSH RFX ON ABNORMAL TO FREE T4: TSH: 2.17 u[IU]/mL (ref 0.450–4.500)

## 2023-01-17 LAB — URINE CULTURE, OB REFLEX

## 2023-01-17 LAB — CULTURE, OB URINE

## 2023-01-19 ENCOUNTER — Encounter: Payer: Self-pay | Admitting: *Deleted

## 2023-01-20 ENCOUNTER — Encounter: Payer: Self-pay | Admitting: Obstetrics and Gynecology

## 2023-01-25 LAB — PANORAMA PRENATAL TEST FULL PANEL:PANORAMA TEST PLUS 5 ADDITIONAL MICRODELETIONS: FETAL FRACTION: 14.9

## 2023-01-27 LAB — HORIZON CUSTOM: REPORT SUMMARY: POSITIVE — AB

## 2023-01-28 ENCOUNTER — Encounter: Payer: Self-pay | Admitting: Obstetrics and Gynecology

## 2023-01-28 DIAGNOSIS — D563 Thalassemia minor: Secondary | ICD-10-CM | POA: Insufficient documentation

## 2023-01-29 ENCOUNTER — Encounter: Payer: Self-pay | Admitting: Obstetrics and Gynecology

## 2023-02-04 ENCOUNTER — Ambulatory Visit (INDEPENDENT_AMBULATORY_CARE_PROVIDER_SITE_OTHER): Payer: 59 | Admitting: Family Medicine

## 2023-02-04 VITALS — BP 111/73 | Wt 139.6 lb

## 2023-02-04 DIAGNOSIS — O0991 Supervision of high risk pregnancy, unspecified, first trimester: Secondary | ICD-10-CM | POA: Diagnosis not present

## 2023-02-04 DIAGNOSIS — Z3A13 13 weeks gestation of pregnancy: Secondary | ICD-10-CM | POA: Diagnosis not present

## 2023-02-04 DIAGNOSIS — D563 Thalassemia minor: Secondary | ICD-10-CM

## 2023-02-04 DIAGNOSIS — O0990 Supervision of high risk pregnancy, unspecified, unspecified trimester: Secondary | ICD-10-CM

## 2023-02-04 NOTE — Patient Instructions (Signed)

## 2023-02-04 NOTE — Progress Notes (Signed)
   PRENATAL VISIT NOTE:Centering Pregnancy Group 15, Session 1   Subjective:  Veronica Buchanan is a 27 y.o. G1P0000 at [redacted]w[redacted]d being seen today for ongoing prenatal care.  She is currently monitored for the following issues for this low-risk pregnancy and has Endometriosis; History of asthma; History of migraine; History of bipolar disorder; Nausea; Supervision of high risk pregnancy, antepartum; and Alpha thalassemia silent carrier on their problem list.  Patient reports no complaints.  Contractions: Not present. Vag. Bleeding: None.  Movement: Absent. Denies leaking of fluid.   The following portions of the patient's history were reviewed and updated as appropriate: allergies, current medications, past family history, past medical history, past social history, past surgical history and problem list.   Objective:   Vitals:   02/04/23 1149  BP: 111/73  Weight: 139 lb 9.6 oz (63.3 kg)    Fetal Status: Fetal Heart Rate (bpm): 150   Movement: Absent     General:  Alert, oriented and cooperative. Patient is in no acute distress.  Skin: Skin is warm and dry. No rash noted.   Cardiovascular: Normal heart rate noted  Respiratory: Normal respiratory effort, no problems with respiration noted  Abdomen: Soft, gravid, appropriate for gestational age.  Pain/Pressure: Absent     Pelvic: Cervical exam deferred        Extremities: Normal range of motion.     Mental Status: Normal mood and affect. Normal behavior. Normal judgment and thought content.   Assessment and Plan:  Pregnancy: G1P0000 at 104w5d 1. Supervision of high risk pregnancy, antepartum   Centering Pregnancy, Session#1: Introduction to model of care. Group determined rules for self-governance and closing phrase. Oriented group to space and mother's notebook.   Facilitated discussion today:  common discomforts, When to call practice  Mindfulness activity completed as well as introduction to deep breathing for childbirth preparation-  Centering 3 Breaths  Fundal height and FHR appropriate today unless noted otherwise in plan of care. Patient to continue group care.    2. Alpha thalassemia silent carrier Ask about partner testing kit, forgot to do during group today  Preterm labor symptoms and general obstetric precautions including but not limited to vaginal bleeding, contractions, leaking of fluid and fetal movement were reviewed in detail with the patient. Please refer to After Visit Summary for other counseling recommendations.   Return in about 4 weeks (around 03/04/2023) for Centering Pregnancy.  Future Appointments  Date Time Provider Department Center  02/08/2023  9:00 PM [facility] SLEEP LAB [facility] None  02/09/2023  7:15 AM [facility] SLEEP LAB [facility] None  03/04/2023  9:00 AM CENTERING PROVIDER [facility]  03/16/2023  9:15 AM [facility] NURSE [facility]  03/16/2023  9:30 AM [facility] US4 [facility]  04/01/2023  9:00 AM CENTERING PROVIDER [facility]  04/15/2023  9:00 AM CENTERING PROVIDER [facility]  04/29/2023  9:00 AM CENTERING PROVIDER [facility]  05/13/2023  9:00 AM CENTERING PROVIDER [facility]  06/03/2023  2:00 PM CENTERING PROVIDER [facility]  06/10/2023  9:00 AM CENTERING PROVIDER [facility]  06/24/2023  9:00 AM CENTERING PROVIDER [facility]  07/08/2023  9:00 AM CENTERING PROVIDER [facility]    Federico Flake, MD

## 2023-02-08 ENCOUNTER — Ambulatory Visit (INDEPENDENT_AMBULATORY_CARE_PROVIDER_SITE_OTHER): Payer: 59 | Admitting: Neurology

## 2023-02-08 DIAGNOSIS — G47411 Narcolepsy with cataplexy: Secondary | ICD-10-CM

## 2023-02-08 DIAGNOSIS — F518 Other sleep disorders not due to a substance or known physiological condition: Secondary | ICD-10-CM

## 2023-02-08 DIAGNOSIS — D563 Thalassemia minor: Secondary | ICD-10-CM

## 2023-02-08 DIAGNOSIS — G4753 Recurrent isolated sleep paralysis: Secondary | ICD-10-CM | POA: Diagnosis not present

## 2023-02-08 DIAGNOSIS — G4719 Other hypersomnia: Secondary | ICD-10-CM

## 2023-02-08 DIAGNOSIS — Z8659 Personal history of other mental and behavioral disorders: Secondary | ICD-10-CM

## 2023-02-09 ENCOUNTER — Other Ambulatory Visit: Payer: Self-pay

## 2023-02-09 ENCOUNTER — Other Ambulatory Visit: Payer: Self-pay | Admitting: *Deleted

## 2023-02-09 ENCOUNTER — Ambulatory Visit: Payer: 59 | Admitting: Neurology

## 2023-02-09 DIAGNOSIS — Z79899 Other long term (current) drug therapy: Secondary | ICD-10-CM

## 2023-02-09 DIAGNOSIS — G47411 Narcolepsy with cataplexy: Secondary | ICD-10-CM

## 2023-02-09 DIAGNOSIS — G4719 Other hypersomnia: Secondary | ICD-10-CM

## 2023-02-09 DIAGNOSIS — G4753 Recurrent isolated sleep paralysis: Secondary | ICD-10-CM

## 2023-02-09 DIAGNOSIS — F518 Other sleep disorders not due to a substance or known physiological condition: Secondary | ICD-10-CM

## 2023-02-09 NOTE — Addendum Note (Signed)
Addended by: Bertram Savin on: 02/09/2023 07:55 AM   Modules accepted: Orders

## 2023-02-11 ENCOUNTER — Encounter: Payer: Self-pay | Admitting: Obstetrics and Gynecology

## 2023-02-16 ENCOUNTER — Telehealth: Payer: Self-pay

## 2023-02-16 NOTE — Telephone Encounter (Addendum)
Pt called reporting pain that she has for two days.  Called pt and pt reports that she has had pain in lower abdomen in the groin.  Pt reports that she worked back to back at work and that is when the pain began.   Pt advised that is sounds like round ligament pain and that she could do some pelvic exercises, warm soak bath, or heating pad on 20 minutes at a time.  Pt verbalized understanding.   Leonette Nutting

## 2023-02-17 ENCOUNTER — Telehealth: Payer: Self-pay | Admitting: Anesthesiology

## 2023-02-17 NOTE — Telephone Encounter (Signed)
-----   Message from Livingston Dohmeier sent at 02/16/2023  5:29 PM EDT ----- Negative toxicology screen

## 2023-02-21 DIAGNOSIS — G47411 Narcolepsy with cataplexy: Secondary | ICD-10-CM

## 2023-02-21 DIAGNOSIS — G4753 Recurrent isolated sleep paralysis: Secondary | ICD-10-CM | POA: Insufficient documentation

## 2023-02-21 DIAGNOSIS — G4719 Other hypersomnia: Secondary | ICD-10-CM | POA: Insufficient documentation

## 2023-02-21 DIAGNOSIS — F518 Other sleep disorders not due to a substance or known physiological condition: Secondary | ICD-10-CM | POA: Insufficient documentation

## 2023-02-21 HISTORY — DX: Narcolepsy with cataplexy: G47.411
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
## 2023-02-21 NOTE — Procedures (Signed)
[facility] MSLT Report    Name: Veronica Buchanan, Veronica Buchanan Reference 0  Study Date: 02/09/2023 Procedure #: 0 DOB: 08-29-1995  Protocol This is a 13 channel Multiple Sleep Latency Test comprised of 5 channels of EEG (T3-Cz, Cz-T4, F4-M1, C4-M1, O2-M1). 3 channels of Chin EMG, 4 channels of EOG and 1 channel for ECG. All channels were sampled at 256 Hz.  This polysomnographic procedure is designed to evaluate (1) the complaint of excessive daytime sleepiness by quantifying the time required to fall asleep and (2) the possibility of narcolepsy by checking for abnormally short latencies to REM sleep. Electrographic variables include EEG, EMG, EOG and ECG. Patients are monitored throughout four or five 20-minute opportunities to sleep (naps) at two-hour intervals. For each nap, the patient is allowed 20 minutes to fall asleep. Once asleep, the patient is awakened after 15 minutes. Between naps, the patient is kept as alert as possible. A sleep latency of 20 minutes indicates that no sleep occurred. Parametric Analysis   Name: Veronica Buchanan, Veronica Buchanan BMI: 22 Physician: ,   ID: [id number] Height: 66 in Technician: Margaretann Loveless  Sex: Female Weight: 138 lb Record: [id number]  Age: 27 [Jan 11, 1996] Date: 02/09/2023 Scorer: Meagan Allen   Nap 1 Nap Start: 08:52:08 AM Nap End: 09:12:16 AM    Latency to Sleep Onset: 5.78m Total Sleep Time: 15.33m Stg Dur REM N1 N2 N3   0.7m 1.56m 13.30m 0.50m    Nap 2 Nap Start: 10:33:06 AM Nap End: 10:53:25 AM    Latency to Sleep Onset: 5.59m Total Sleep Time: 15.83m Stg Dur REM N1 N2 N3   0.100m 1.93m 13.80m 0.41m    Nap 3 Nap Start: 12:26:03 PM Nap End: 12:48:10 PM    Latency to Sleep Onset: 7.87m Total Sleep Time: 15.41m Stg Dur REM N1 N2 N3   0.60m 1.18m 13.70m 0.40m    Nap 4 Nap Start: 02:14:16 PM Nap End: 02:34:03 PM    Latency to Sleep Onset: 20.77m Total Sleep Time: 0.65m Stg Dur REM N1 N2 N3   0.54m 0.76m 0.13m 0.52m    Nap 5 Nap Start: 04:04:02 PM Nap  End: 04:28:03 PM    Latency to Sleep Onset: 9.8m Total Sleep Time: 14.93m Stg Dur REM N1 N2 N3   0.40m 2.33m 12.36m 0.26m    Mean Sleep Latency: 9.107m Number of Sleep Onset REM Periods: 0 Sleepiness Index: 52.5  Mean Sleep Latency: 9.5  Number of Naps with REM Sleep: 0      Results from Preceding PSG Study  Sleep Onset Time See attached  Sleep Efficiency (%) 77%  Rise Time See attached  Sleep Latency (min)  Total Sleep Time 366 min REM Latency (min)   Name: Veronica Buchanan, Veronica Buchanan Reference 0  Study Date: 02/09/2023 Procedure #: 0 DOB: 28-Oct-1995  IMPRESSION: No REM sleep onset was seen, and the patient was able to fall asleep in 4/ 5 nap opportunities with a sleep latency of  9.5 minutes. Mean sleep latency for the first 3 naps was 6 minutes.  RECOMMENDATIONS: This study can verify a high degree of daytime sleepiness but not narcolepsy. Idiopathic hypersomnia is the working diagnosis. Medication treatment for idiopathic hypersomnia allows for use of XYREM/ XYWAV and related medications.  I attest to having reviewed every epoch of the entire raw data recording prior to the issuance of this report in accordance with the Standards of the American Academy of Sleep Medicine. Melvyn Novas, MD

## 2023-02-23 ENCOUNTER — Encounter: Payer: Self-pay | Admitting: Neurology

## 2023-03-04 ENCOUNTER — Encounter: Payer: Self-pay | Admitting: Family Medicine

## 2023-03-04 ENCOUNTER — Ambulatory Visit (INDEPENDENT_AMBULATORY_CARE_PROVIDER_SITE_OTHER): Payer: 59 | Admitting: Family Medicine

## 2023-03-04 VITALS — BP 108/74 | HR 102 | Wt 139.8 lb

## 2023-03-04 DIAGNOSIS — D563 Thalassemia minor: Secondary | ICD-10-CM | POA: Insufficient documentation

## 2023-03-04 DIAGNOSIS — Z3A17 17 weeks gestation of pregnancy: Secondary | ICD-10-CM

## 2023-03-04 DIAGNOSIS — F419 Anxiety disorder, unspecified: Secondary | ICD-10-CM

## 2023-03-04 DIAGNOSIS — O0992 Supervision of high risk pregnancy, unspecified, second trimester: Secondary | ICD-10-CM

## 2023-03-04 DIAGNOSIS — O0990 Supervision of high risk pregnancy, unspecified, unspecified trimester: Secondary | ICD-10-CM

## 2023-03-04 MED ORDER — SERTRALINE HCL 25 MG PO TABS
25.0000 mg | ORAL_TABLET | Freq: Every day | ORAL | 2 refills | Status: DC
Start: 2023-03-04 — End: 2023-07-20

## 2023-03-04 MED ORDER — HYDROXYZINE HCL 25 MG PO TABS
25.0000 mg | ORAL_TABLET | Freq: Four times a day (QID) | ORAL | 2 refills | Status: AC | PRN
Start: 2023-03-04 — End: ?

## 2023-03-04 NOTE — Patient Instructions (Addendum)
Options for Doula Care in the [place]  As you review your birthing options, consider having a birth doula. A doula is trained to provide support before, during and just after you give birth. There are also postpartum doulas that help you adjust to new parenthood.  While doulas do not provide medical care, they do provide emotional, physical and educational support. A few months before your baby arrives, doulas can help answer questions, ease concerns and help you create and support your birthing plan.    Doulas can help reduce your stress and comfort you and your partner. They can help you cope with labor by helping you use breathing techniques, massage, creative labor positioning, essential oils and affirmations.   Studies show that the benefits of having a doula include:   A more positive birth experience  Fewer requests for pain-relief medication  Less likelihood of cesarean section, commonly called a c-section   Doulas are typically hired via a [employer] account planner between you and the doula. We are happy to provide a list of the most active doulas in the area, all of whom are credentialed by [facility] and will not count as a visitor at your birth.  There are several options for no-cost doula care at our hospital, including:  [facility] Volunteer Doula Program Every [employer] Program A Cure 4 Moms Doula Study (available only at [employer] for Women, [place], [place] and [employer] [facility] offices)  For more information on these programs or to receive a list of doulas active in our area, please email [email or web address]   Here is the intake for the Doula Program/study  [email or web address]        Considering Linden Dolin? Guide for patients at Center for [employer] [facility]) Why consider waterbirth? Gentle birth for babies  Less pain medicine used in labor  May allow for passive descent/less pushing  May reduce perineal tears  More mobility and  instinctive maternal position changes  Increased maternal relaxation   Is waterbirth safe? What are the risks of infection, drowning or other complications? Infection:  Very low risk (3.7 % for tub vs 4.8% for bed)  7 in 8000 waterbirths with documented infection  Poorly cleaned equipment most common cause  Slightly lower group B strep transmission rate  Drowning  Maternal:  Very low risk  Related to seizures or fainting  Newborn:  Very low risk. No evidence of increased risk of respiratory problems in multiple large studies  Physiological protection from breathing under water  Avoid underwater birth if there are any fetal complications  Once baby's head is out of the water, keep it out.  Birth complication  Some reports of cord trauma, but risk decreased by bringing baby to surface gradually  No evidence of increased risk of shoulder dystocia. Mothers can usually change positions faster in water than in a bed, possibly aiding the maneuvers to free the shoulder.   There are 2 things you MUST do to have a waterbirth with [facility]: Attend a waterbirth class at [employer] [facility]   3rd Wednesday of every month from 7-9 pm (virtual during COVID) [employer] at [email or web address] or [email or web address] or by calling [phone] Bring [place] the certificate from the class to your prenatal appointment or send via MyChart Meet with a midwife at 36 weeks* to see if you can still plan a waterbirth and to sign the consent.   *We also recommend that you schedule as many of your prenatal visits with a midwife  as possible.    Helpful information: You may want to bring a bathing suit top to the hospital to wear during labor but this is optional.  All other supplies are provided by the hospital. Please arrive at the hospital with signs of active labor, and do not wait at home until late in labor. It takes 45 min- 1 hour for fetal monitoring, and check in to your  room to take place, plus transport and filling of the waterbirth tub.    Things that would prevent you from having a waterbirth: Premature, <37wks  Previous cesarean birth  Presence of thick meconium-stained fluid  Multiple gestation (Twins, triplets, etc.)  Uncontrolled diabetes or gestational diabetes requiring medication  Hypertension diagnosed in pregnancy or preexisting hypertension (gestational hypertension, preeclampsia, or chronic hypertension) Fetal growth restriction (your baby measures less than 10th percentile on ultrasound) Heavy vaginal bleeding  Non-reassuring fetal heart rate  Active infection (MRSA, etc.). Group B Strep is NOT a contraindication for waterbirth.  If your labor has to be induced and induction method requires continuous monitoring of the baby's heart rate  Other risks/issues identified by your obstetrical provider   Please remember that birth is unpredictable. Under certain unforeseeable circumstances your provider may advise against giving birth in the tub. These decisions will be made on a case-by-case basis and with the safety of you and your baby as our highest priority.    Updated 08/28/21

## 2023-03-04 NOTE — Assessment & Plan Note (Signed)
Patient reports longstanding history of anxiety. She was trialed on multiple medications. She was on Sertraline and Hydroxyzine which was the Modi combination but these were stopped. She had noted more trouble sleeping and having panic attacks.

## 2023-03-04 NOTE — Progress Notes (Signed)
PRENATAL VISIT NOTE: Centering Pregnancy Group 15, Session 2   Subjective:  Veronica Buchanan is a 27 y.o. G1P0000 at 102w5d being seen today for ongoing prenatal care.  She is currently monitored for the following issues for this low-risk pregnancy and has Endometriosis; History of asthma; History of migraine; History of bipolar disorder; Nausea; Supervision of high risk pregnancy, antepartum; Alpha thalassemia silent carrier; Excessive daytime sleepiness; Abnormal dreams; Recurrent isolated sleep paralysis; Cataplexy; and Anxiety on their problem list.  Patient reports  Trouble sleeping, worsening anxiety .  Contractions: Not present. Vag. Bleeding: None.  Movement: Present. Denies leaking of fluid.   The following portions of the patient's history were reviewed and updated as appropriate: allergies, current medications, past family history, past medical history, past social history, past surgical history and problem list.   Objective:   Vitals:   03/04/23 0917  BP: 108/74  Pulse: (!) 102  Weight: 139 lb 12.8 oz (63.4 kg)    Fetal Status: Fetal Heart Rate (bpm): 148 Fundal Height: 17 cm Movement: Present     General:  Alert, oriented and cooperative. Patient is in no acute distress.  Skin: Skin is warm and dry. No rash noted.   Cardiovascular: Normal heart rate noted  Respiratory: Normal respiratory effort, no problems with respiration noted  Abdomen: Soft, gravid, appropriate for gestational age.  Pain/Pressure: Absent     Pelvic: Cervical exam deferred        Extremities: Normal range of motion.     Mental Status: Normal mood and affect. Normal behavior. Normal judgment and thought content.   Assessment and Plan:  Pregnancy: G1P0000 at [redacted]w[redacted]d  1. Supervision of high risk pregnancy, antepartum  Centering Pregnancy, Session#2: Reviewed rules for self-governance with group. Direct group to [employer].   Facilitated discussion today:  Nutrition, Back pain, Genetic screening  options with AFP/Quad.   Mindfulness activity completed as well as deep breathing with 1 minutes of tension and 1 minute of relaxation for childbirth preparation.  Also participated in mindful eating activity  Fundal height and FHR appropriate today unless noted otherwise in plan. Patient to continue group care.   - AFP, Serum, Open Spina Bifida - CBC; Future - Glucose Tolerance, 2 Hours w/1 Hour; Future - HIV Antibody (routine testing w rflx); Future - RPR; Future - Patient also interested in WB - Pt is interested in waterbirth.  No contraindications at this time per chart review/patient assessment.   - Pt to enroll in class, see CNMs for most visits in the office.  - Discussed waterbirth as option for low-risk pregnancy.  Reviewed conditions that may arise during pregnancy that will risk pt out of waterbirth including hypertension, diabetes, fetal growth restriction <10%ile, etc. - Discussed Doula care-- desires this and was referred to [facility]  2. Anxiety Discussed at length Reviewed medication safety in pregnancy Desires to restart  - sertraline (ZOLOFT) 25 MG tablet; Take 1 tablet (25 mg total) by mouth daily. If anxiety/depression does not worsen, increase to 2 tablets daily (50mg )  Dispense: 60 tablet; Refill: 2 - hydrOXYzine (ATARAX) 25 MG tablet; Take 1 tablet (25 mg total) by mouth every 6 (six) hours as needed for itching or anxiety.  Dispense: 30 tablet; Refill: 2  Preterm labor symptoms and general obstetric precautions including but not limited to vaginal bleeding, contractions, leaking of fluid and fetal movement were reviewed in detail with the patient. Please refer to After Visit Summary for other counseling recommendations.   No follow-ups on file.  Future Appointments  Date Time Provider Department Center  03/16/2023  9:15 AM [facility] NURSE [facility]  03/16/2023  9:30 AM [facility] US4 [facility]  04/01/2023  9:00 AM CENTERING PROVIDER [facility]  04/15/2023  9:00  AM CENTERING PROVIDER [facility]  04/29/2023  9:00 AM CENTERING PROVIDER [facility]  05/13/2023  8:20 AM [facility] LAB [facility]  05/13/2023  9:00 AM CENTERING PROVIDER [facility]  06/03/2023  2:00 PM CENTERING PROVIDER [facility]  06/10/2023  9:00 AM CENTERING PROVIDER [facility]  06/24/2023  9:00 AM CENTERING PROVIDER [facility]  07/08/2023  9:00 AM CENTERING PROVIDER [facility]    Federico Flake, MD

## 2023-03-06 LAB — AFP, SERUM, OPEN SPINA BIFIDA
AFP MoM: 1.46
AFP Value: 63.7 ng/mL
Gest. Age on Collection Date: 17 wk
Maternal Age At EDD: 27.6 a
OSBR Risk 1 IN: 6095
Test Results:: NEGATIVE
Weight: 139 [lb_av]

## 2023-03-16 ENCOUNTER — Encounter: Payer: Self-pay | Admitting: *Deleted

## 2023-03-16 ENCOUNTER — Ambulatory Visit: Payer: 59 | Admitting: *Deleted

## 2023-03-16 ENCOUNTER — Encounter: Payer: Self-pay | Admitting: Obstetrics and Gynecology

## 2023-03-16 ENCOUNTER — Ambulatory Visit: Payer: 59 | Attending: Obstetrics and Gynecology

## 2023-03-16 ENCOUNTER — Other Ambulatory Visit: Payer: Self-pay | Admitting: *Deleted

## 2023-03-16 ENCOUNTER — Other Ambulatory Visit: Payer: Self-pay

## 2023-03-16 VITALS — BP 106/62 | HR 97

## 2023-03-16 DIAGNOSIS — O99512 Diseases of the respiratory system complicating pregnancy, second trimester: Secondary | ICD-10-CM | POA: Diagnosis not present

## 2023-03-16 DIAGNOSIS — Z362 Encounter for other antenatal screening follow-up: Secondary | ICD-10-CM

## 2023-03-16 DIAGNOSIS — D563 Thalassemia minor: Secondary | ICD-10-CM | POA: Diagnosis present

## 2023-03-16 DIAGNOSIS — O0990 Supervision of high risk pregnancy, unspecified, unspecified trimester: Secondary | ICD-10-CM

## 2023-03-16 DIAGNOSIS — J45909 Unspecified asthma, uncomplicated: Secondary | ICD-10-CM | POA: Diagnosis not present

## 2023-03-16 DIAGNOSIS — O285 Abnormal chromosomal and genetic finding on antenatal screening of mother: Secondary | ICD-10-CM | POA: Diagnosis not present

## 2023-03-16 DIAGNOSIS — O3660X0 Maternal care for excessive fetal growth, unspecified trimester, not applicable or unspecified: Secondary | ICD-10-CM | POA: Insufficient documentation

## 2023-03-16 DIAGNOSIS — Z3A19 19 weeks gestation of pregnancy: Secondary | ICD-10-CM

## 2023-04-01 ENCOUNTER — Encounter: Payer: Self-pay | Admitting: Family Medicine

## 2023-04-01 ENCOUNTER — Ambulatory Visit (INDEPENDENT_AMBULATORY_CARE_PROVIDER_SITE_OTHER): Payer: 59 | Admitting: Family Medicine

## 2023-04-01 VITALS — BP 110/79 | HR 87 | Wt 146.8 lb

## 2023-04-01 DIAGNOSIS — O3662X0 Maternal care for excessive fetal growth, second trimester, not applicable or unspecified: Secondary | ICD-10-CM

## 2023-04-01 DIAGNOSIS — Z3A21 21 weeks gestation of pregnancy: Secondary | ICD-10-CM

## 2023-04-01 DIAGNOSIS — O0990 Supervision of high risk pregnancy, unspecified, unspecified trimester: Secondary | ICD-10-CM

## 2023-04-01 DIAGNOSIS — O0992 Supervision of high risk pregnancy, unspecified, second trimester: Secondary | ICD-10-CM | POA: Diagnosis not present

## 2023-04-06 NOTE — Progress Notes (Addendum)
PRENATAL VISIT NOTE: Centering Pregnancy Group 15, Session 3  Subjective:  Veronica Buchanan is a 27 y.o. G1P0000 at [redacted]w[redacted]d being seen today for ongoing prenatal care.  She is currently monitored for the following issues for this low-risk pregnancy and has Endometriosis; History of asthma; History of migraine; History of bipolar disorder; Nausea; Supervision of high risk pregnancy, antepartum; Excessive daytime sleepiness; Abnormal dreams; Recurrent isolated sleep paralysis; Cataplexy; Anxiety; Thalassemia alpha carrier; and LGA (large for gestational age) fetus affecting management of mother on their problem list.  Patient reports no complaints.  Contractions: Irregular. Vag. Bleeding: None.  Movement: Present. Denies leaking of fluid.   The following portions of the patient's history were reviewed and updated as appropriate: allergies, current medications, past family history, past medical history, past social history, past surgical history and problem list.   Objective:   Vitals:   04/01/23 1147  BP: 110/79  Pulse: 87  Weight: 146 lb 12.8 oz (66.6 kg)    Fetal Status: Fetal Heart Rate (bpm): 150 Fundal Height: 21 cm Movement: Present     General:  Alert, oriented and cooperative. Patient is in no acute distress.  Skin: Skin is warm and dry. No rash noted.   Cardiovascular: Normal heart rate noted  Respiratory: Normal respiratory effort, no problems with respiration noted  Abdomen: Soft, gravid, appropriate for gestational age.  Pain/Pressure: Absent     Pelvic: Cervical exam deferred        Extremities: Normal range of motion.     Mental Status: Normal mood and affect. Normal behavior. Normal judgment and thought content.   Assessment and Plan:  Pregnancy: G1P0000 at [redacted]w[redacted]d 1. Supervision of high risk pregnancy, antepartum Centering Pregnancy, Session#3: Reviewed resources in [employer].   Facilitated discussion today:  PTL precautions, back pain, stretches, stress reductions,  and breastfeeding/infant nutrition Mindfulness activity completed as well as deep breathing with still touch for childbirth preparation.    Fundal height and FHR appropriate today unless noted otherwise in plan. Patient to continue group care.   Seeking waterbirth, took class 10/17   - Pt interested in waterbirth and has attended the class.  - Reviewed conditions in labor that will risk her out of water immersion including thick meconium or blood stained amniotic fluid, non-reassuring fetal status on monitor, excessive bleeding, hypertension, dizziness, use of IV meds, damaged equipment or staffing that does not allow for water immersion, etc.  - The attending midwife must be on the unit for water immersion to begin; pt understands this may delay the start of water immersion. - Reminded pt that signing consent in labor at the hospital also acknowledges they will exit the tub if the attending midwife requests. - Consent given to patient for review.  Consent will be reviewed and signed at the hospital by the waterbirth provider prior to use of the tub. - Discussed other labor support options if waterbirth becomes unavailable, including position change, freedom of movement, use of birthing ball, and/or use of hydrotherapy in the shower (dependent upon medical condition/provider discretion).    2. Excessive fetal growth affecting management of pregnancy in second trimester, single or unspecified fetus TWG=8 lb 12.8 oz (3.992 kg)  Fetus measuring in high growth% Repeat growth [place] scheduled  Preterm labor symptoms and general obstetric precautions including but not limited to vaginal bleeding, contractions, leaking of fluid and fetal movement were reviewed in detail with the patient. Please refer to After Visit Summary for other counseling recommendations.   Return in about 2  weeks (around 04/15/2023) for Centering Pregnancy.  Future Appointments  Date Time Provider Department Center  04/15/2023   9:00 AM CENTERING PROVIDER [facility]  04/20/2023 10:30 AM [facility] US1 [facility]  04/29/2023  9:00 AM CENTERING PROVIDER [facility]  05/13/2023  8:20 AM [facility] LAB [facility]  05/13/2023  9:00 AM CENTERING PROVIDER [facility]  06/03/2023  2:00 PM CENTERING PROVIDER [facility]  06/10/2023  9:00 AM CENTERING PROVIDER [facility]  06/24/2023  9:00 AM CENTERING PROVIDER [facility]  07/08/2023  9:00 AM CENTERING PROVIDER [facility]    Federico Flake, MD

## 2023-04-09 ENCOUNTER — Encounter: Payer: Self-pay | Admitting: Family Medicine

## 2023-04-14 NOTE — Progress Notes (Unsigned)
       PRENATAL VISIT NOTE- Centering Pregnancy Cycle {NUMBERS:20191}, Session # {NUMBERS:20191}  Subjective:  Veronica Buchanan is a 27 y.o. G1P0000 at [redacted]w[redacted]d being seen today for ongoing prenatal care through Centering Pregnancy.  She is currently monitored for the following issues for this {Blank single:19197::"high-risk","low-risk"} pregnancy and has Endometriosis; History of asthma; History of migraine; History of bipolar disorder; Nausea; Supervision of high risk pregnancy, antepartum; Excessive daytime sleepiness; Abnormal dreams; Recurrent isolated sleep paralysis; Cataplexy; Anxiety; Thalassemia alpha carrier; and LGA (large for gestational age) fetus affecting management of mother on their problem list.  Patient reports {sx:14538}.   .  .   . ***Denies leaking of fluid/ROM.   The following portions of the patient's history were reviewed and updated as appropriate: allergies, current medications, past family history, past medical history, past social history, past surgical history and problem list. Problem list updated.  Objective:  There were no vitals filed for this visit.  Fetal Status:           General:  Alert, oriented and cooperative. Patient is in no acute distress.  Skin: Skin is warm and dry. No rash noted.   Cardiovascular: Normal heart rate noted  Respiratory: Normal respiratory effort, no problems with respiration noted  Abdomen: Soft, gravid, appropriate for gestational age.        Pelvic: {Blank single:19197::"Cervical exam performed","Cervical exam deferred"}        Extremities: Normal range of motion.     Mental Status: Normal mood and affect. Normal behavior. Normal judgment and thought content.   Assessment and Plan:  Pregnancy: G1P0000 at [redacted]w[redacted]d  1. Supervision of high risk pregnancy, antepartum ***  Centering Pregnancy, Session#4: Reviewed resources in [employer].   Facilitated discussion today:  Family dynamics/environment, family planning  postpartum, and preterm labor Mindfulness activity completed as well as deep breathing with hand massage for childbirth preparation.    Fundal height and FHR appropriate today unless noted otherwise in plan. Patient to continue group care.   2. Excessive fetal growth affecting management of pregnancy in second trimester, single or unspecified fetus ***  3. [redacted] weeks gestation of pregnancy ***   *** add [facility] dot phrase for session   {Blank single:19197::"Term","Preterm"} labor symptoms and general obstetric precautions including but not limited to vaginal bleeding, contractions, leaking of fluid and fetal movement were reviewed in detail with the patient. Please refer to After Visit Summary for other counseling recommendations.  No follow-ups on file.  Future Appointments  Date Time Provider Department Center  04/15/2023  9:00 AM CENTERING PROVIDER [facility]  04/20/2023 10:30 AM [facility] US1 [facility]  04/29/2023  9:00 AM CENTERING PROVIDER [facility]  05/13/2023  8:20 AM [facility] LAB [facility]  05/13/2023  9:00 AM CENTERING PROVIDER [facility]  06/03/2023  2:00 PM CENTERING PROVIDER [facility]  06/10/2023  9:00 AM CENTERING PROVIDER [facility]  06/24/2023  9:00 AM CENTERING PROVIDER [facility]  07/08/2023  9:00 AM CENTERING PROVIDER [facility]    Sharen Counter, CNM

## 2023-04-15 ENCOUNTER — Ambulatory Visit: Payer: 59

## 2023-04-15 ENCOUNTER — Encounter: Payer: Self-pay | Admitting: *Deleted

## 2023-04-15 VITALS — BP 104/73 | HR 85 | Wt 151.0 lb

## 2023-04-15 DIAGNOSIS — Z3A23 23 weeks gestation of pregnancy: Secondary | ICD-10-CM | POA: Diagnosis not present

## 2023-04-15 DIAGNOSIS — S99921A Unspecified injury of right foot, initial encounter: Secondary | ICD-10-CM

## 2023-04-15 DIAGNOSIS — O0990 Supervision of high risk pregnancy, unspecified, unspecified trimester: Secondary | ICD-10-CM | POA: Diagnosis not present

## 2023-04-15 DIAGNOSIS — O3662X0 Maternal care for excessive fetal growth, second trimester, not applicable or unspecified: Secondary | ICD-10-CM

## 2023-04-15 MED ORDER — TRAMADOL HCL 50 MG PO TABS: 50.0000 mg | ORAL_TABLET | Freq: Four times a day (QID) | ORAL | 0 refills | Status: DC | PRN

## 2023-04-20 ENCOUNTER — Ambulatory Visit: Payer: 59 | Attending: Obstetrics

## 2023-04-20 ENCOUNTER — Other Ambulatory Visit: Payer: Self-pay

## 2023-04-20 DIAGNOSIS — O99512 Diseases of the respiratory system complicating pregnancy, second trimester: Secondary | ICD-10-CM

## 2023-04-20 DIAGNOSIS — J45909 Unspecified asthma, uncomplicated: Secondary | ICD-10-CM

## 2023-04-20 DIAGNOSIS — D563 Thalassemia minor: Secondary | ICD-10-CM

## 2023-04-20 DIAGNOSIS — Z3A24 24 weeks gestation of pregnancy: Secondary | ICD-10-CM

## 2023-04-20 DIAGNOSIS — O99013 Anemia complicating pregnancy, third trimester: Secondary | ICD-10-CM | POA: Diagnosis not present

## 2023-04-20 DIAGNOSIS — Z362 Encounter for other antenatal screening follow-up: Secondary | ICD-10-CM | POA: Diagnosis present

## 2023-04-21 ENCOUNTER — Encounter: Payer: Self-pay | Admitting: *Deleted

## 2023-04-28 ENCOUNTER — Encounter: Payer: Self-pay | Admitting: Family Medicine

## 2023-04-29 ENCOUNTER — Telehealth: Payer: Self-pay | Admitting: *Deleted

## 2023-04-29 ENCOUNTER — Encounter: Payer: Self-pay | Admitting: *Deleted

## 2023-04-29 NOTE — Telephone Encounter (Signed)
I called Veronica Buchanan and left a message centering appointment changed to 10 am to 12 instead of 9 to 11 due to 2 hour school delay. Nancy Fetter

## 2023-04-29 NOTE — Telephone Encounter (Signed)
I called Veronica Buchanan and left a message centering today is 10 am to 12 noon due to 2 hour school delay. I also wanted to clarify since her appointment was cancelled already , possibly in error if she was coming today. Nancy Fetter

## 2023-05-11 ENCOUNTER — Telehealth: Payer: Self-pay | Admitting: Family Medicine

## 2023-05-11 ENCOUNTER — Telehealth: Payer: Self-pay | Admitting: Obstetrics and Gynecology

## 2023-05-11 NOTE — Telephone Encounter (Signed)
Patient called in stating she went to urgent care for cold sympoms, her boyfriend went as well because he is sick as well and he might have pneumonia. Urgent care told her to call us and let us know for further plan of care.

## 2023-05-11 NOTE — Telephone Encounter (Signed)
I spoke with Veronica Buchanan and [name] together to provide the [employer] carrier test results for alpha thalassemia (HBA1/HBA2) for Veronica Buchanan.  His results are negative, indicating no change in the genes for alpha thalassemia.  Because [name] is a silent carrier, their children may also be silent carriers, but are not at risk for clinically significant hemoglobinopathy symptoms.  We can be reached at [phone] with any questions or concerns.  [name], MS, CGC

## 2023-05-12 ENCOUNTER — Encounter: Payer: Self-pay | Admitting: *Deleted

## 2023-05-13 ENCOUNTER — Encounter: Payer: Self-pay | Admitting: *Deleted

## 2023-05-13 ENCOUNTER — Other Ambulatory Visit: Payer: 59

## 2023-05-13 ENCOUNTER — Ambulatory Visit: Payer: 59 | Admitting: Family Medicine

## 2023-05-13 ENCOUNTER — Other Ambulatory Visit: Payer: Self-pay

## 2023-05-13 VITALS — BP 108/63 | HR 94 | Wt 158.8 lb

## 2023-05-13 DIAGNOSIS — D563 Thalassemia minor: Secondary | ICD-10-CM

## 2023-05-13 DIAGNOSIS — Z1332 Encounter for screening for maternal depression: Secondary | ICD-10-CM

## 2023-05-13 DIAGNOSIS — Z3A27 27 weeks gestation of pregnancy: Secondary | ICD-10-CM

## 2023-05-13 DIAGNOSIS — O3663X0 Maternal care for excessive fetal growth, third trimester, not applicable or unspecified: Secondary | ICD-10-CM

## 2023-05-13 DIAGNOSIS — F419 Anxiety disorder, unspecified: Secondary | ICD-10-CM

## 2023-05-13 DIAGNOSIS — O0992 Supervision of high risk pregnancy, unspecified, second trimester: Secondary | ICD-10-CM

## 2023-05-13 DIAGNOSIS — K219 Gastro-esophageal reflux disease without esophagitis: Secondary | ICD-10-CM

## 2023-05-13 DIAGNOSIS — O0990 Supervision of high risk pregnancy, unspecified, unspecified trimester: Secondary | ICD-10-CM

## 2023-05-13 DIAGNOSIS — O3662X0 Maternal care for excessive fetal growth, second trimester, not applicable or unspecified: Secondary | ICD-10-CM

## 2023-05-13 MED ORDER — OMEPRAZOLE MAGNESIUM 20 MG PO TBEC: 20.0000 mg | DELAYED_RELEASE_TABLET | Freq: Every day | ORAL | 6 refills | Status: DC

## 2023-05-13 NOTE — Progress Notes (Signed)
   PRENATAL VISIT NOTE:Centering Pregnancy Group 15, Session 6   Subjective:  Veronica Buchanan is a 27 y.o. G1P0000 at [redacted]w[redacted]d being seen today for ongoing prenatal care.  She is currently monitored for the following issues for this low-risk pregnancy and has Endometriosis; History of asthma; History of migraine; History of bipolar disorder; Nausea; Supervision of high risk pregnancy, antepartum; Excessive daytime sleepiness; Abnormal dreams; Recurrent isolated sleep paralysis; Cataplexy; Anxiety; Thalassemia alpha carrier; and LGA (large for gestational age) fetus affecting management of mother on their problem list.  Patient reports no complaints.  Contractions: Not present. Vag. Bleeding: None.  Movement: Present. Denies leaking of fluid.   The following portions of the patient's history were reviewed and updated as appropriate: allergies, current medications, past family history, past medical history, past social history, past surgical history and problem list.   Objective:   Vitals:   05/13/23 1313  BP: 108/63  Pulse: 94  Weight: 158 lb 12.8 oz (72 kg)    Fetal Status: Fetal Heart Rate (bpm): 148 Fundal Height: 27 cm Movement: Present     General:  Alert, oriented and cooperative. Patient is in no acute distress.  Skin: Skin is warm and dry. No rash noted.   Cardiovascular: Normal heart rate noted  Respiratory: Normal respiratory effort, no problems with respiration noted  Abdomen: Soft, gravid, appropriate for gestational age.  Pain/Pressure: Absent     Pelvic: Cervical exam deferred        Extremities: Normal range of motion.     Mental Status: Normal mood and affect. Normal behavior. Normal judgment and thought content.   Assessment and Plan:  Pregnancy: G1P0000 at [redacted]w[redacted]d 1. Thalassemia alpha carrier (Primary) Partner negative  2. Supervision of high risk pregnancy, antepartum  Centering Pregnancy, Session#6: Reviewed resources in [employer].  Facilitated discussion  today: family planning/reproductive life planning, coping strategies   Mindfulness activity with positive affirmations   Fundal height and FHR appropriate today unless noted otherwise in plan. Patient to continue group care.  WB class on 11/6 Meets her doula in a few days and is excited.   3. Excessive fetal growth affecting management of pregnancy in third trimester, single or unspecified fetus Monitor growth  4. Anxiety On zoloft and hydroxyzine Current zoloft dose = 25mg   Feeling improved mood   Preterm labor symptoms and general obstetric precautions including but not limited to vaginal bleeding, contractions, leaking of fluid and fetal movement were reviewed in detail with the patient. Please refer to After Visit Summary for other counseling recommendations.   Return in about 3 weeks (around 06/03/2023) for Centering Pregnancy.  Future Appointments  Date Time Provider Department Center  06/01/2023  8:55 AM Federico Flake, MD [facility]  06/03/2023  2:00 PM CENTERING PROVIDER [facility]  06/10/2023  9:00 AM CENTERING PROVIDER [facility]  06/24/2023  9:00 AM CENTERING PROVIDER [facility]  07/08/2023  9:00 AM CENTERING PROVIDER [facility]    Federico Flake, MD

## 2023-05-13 NOTE — Progress Notes (Signed)
Pregnancy risk form completed. Veronica Buchanan

## 2023-05-13 NOTE — Telephone Encounter (Signed)
Returned call to patient. She reports she is feeling good. She is currently in Centering and concerns will be addressed there.

## 2023-05-14 LAB — HIV ANTIBODY (ROUTINE TESTING W REFLEX): HIV Screen 4th Generation wRfx: NONREACTIVE

## 2023-05-14 LAB — GLUCOSE TOLERANCE, 2 HOURS W/ 1HR
Glucose, 1 hour: 124 mg/dL (ref 70–179)
Glucose, 2 hour: 90 mg/dL (ref 70–152)
Glucose, Fasting: 82 mg/dL (ref 70–91)

## 2023-05-14 LAB — CBC
Hematocrit: 35.2 % (ref 34.0–46.6)
Hemoglobin: 11 g/dL — ABNORMAL LOW (ref 11.1–15.9)
MCH: 23.5 pg — ABNORMAL LOW (ref 26.6–33.0)
MCHC: 31.3 g/dL — ABNORMAL LOW (ref 31.5–35.7)
MCV: 75 fL — ABNORMAL LOW (ref 79–97)
Platelets: 321 10*3/uL (ref 150–450)
RBC: 4.69 x10E6/uL (ref 3.77–5.28)
RDW: 12.8 % (ref 11.7–15.4)
WBC: 9.3 10*3/uL (ref 3.4–10.8)

## 2023-05-14 LAB — RPR: RPR Ser Ql: NONREACTIVE

## 2023-05-27 NOTE — L&D Delivery Note (Signed)
 OB/GYN Faculty Practice Delivery Note  Veronica Buchanan is a 28 y.o. G1P1001 s/p SVD at [redacted]w[redacted]d. She was admitted for IOL due to DFM and vaginal bleeding.   ROM: 11h 49m with clear fluid GBS Status:  Positive/-- (01/15 0000) Maximum Maternal Temperature: 99.1  Labor Progress: Initial SVE: 3/80/-2. Augmented with AROM. She then progressed to complete @1012 .   Delivery Date/Time: 07/19/2023 @1504   Delivery: Called to room and patient was complete and pushing. Head delivered ROP. No nuchal cord present. Shoulder and body delivered in usual fashion. Infant with spontaneous cry, placed on mother's abdomen, dried and stimulated. Cord clamped x 2 after 1-minute delay, and cut by FOB. Cord blood drawn. Placenta delivered spontaneously with gentle cord traction. Fundus firm with massage, methergine and Pitocin. Labia, perineum, vagina, and cervix inspected without lesion.   Baby Weight: 2890g  Placenta: 3 vessel, intact. Sent to L&D Complications: None Lacerations: None EBL: 350 mL Analgesia: Epidural   Infant:  APGAR (1 MIN): 8  APGAR (5 MINS): 8   Wyn Forster, MD OB Family Medicine Fellow, [facility] for [facility], [facility] 07/19/2023, 5:34 PM

## 2023-06-01 ENCOUNTER — Encounter: Payer: 59 | Admitting: Family Medicine

## 2023-06-03 ENCOUNTER — Telehealth: Payer: Self-pay | Admitting: Family Medicine

## 2023-06-03 NOTE — Telephone Encounter (Signed)
 Called patient so that she is aware of the appointments I scheduled her for in February. She also has some questions about some notes that were put in her mychart. I told her I would let a nurse know and someone will call her back to talk about it.

## 2023-06-04 NOTE — Telephone Encounter (Signed)
 Left message for pt that I am returning her call to please give the office a call back if she continues to have questions or concerns.    Leonette Nutting  06/04/23

## 2023-06-10 ENCOUNTER — Encounter (HOSPITAL_COMMUNITY): Payer: Self-pay | Admitting: Obstetrics & Gynecology

## 2023-06-10 ENCOUNTER — Ambulatory Visit (INDEPENDENT_AMBULATORY_CARE_PROVIDER_SITE_OTHER): Payer: 59 | Admitting: Family Medicine

## 2023-06-10 ENCOUNTER — Other Ambulatory Visit: Payer: Self-pay

## 2023-06-10 ENCOUNTER — Inpatient Hospital Stay (HOSPITAL_COMMUNITY)
Admission: AD | Admit: 2023-06-10 | Discharge: 2023-06-13 | DRG: 833 | Disposition: A | Discharge status: Home / Self Care | Payer: 59 | Attending: Obstetrics and Gynecology | Admitting: Obstetrics and Gynecology

## 2023-06-10 VITALS — BP 96/69 | HR 106 | Wt 163.2 lb

## 2023-06-10 DIAGNOSIS — O4700 False labor before 37 completed weeks of gestation, unspecified trimester: Secondary | ICD-10-CM

## 2023-06-10 DIAGNOSIS — Z91040 Latex allergy status: Secondary | ICD-10-CM

## 2023-06-10 DIAGNOSIS — R110 Nausea: Secondary | ICD-10-CM | POA: Diagnosis not present

## 2023-06-10 DIAGNOSIS — Z3A31 31 weeks gestation of pregnancy: Secondary | ICD-10-CM

## 2023-06-10 DIAGNOSIS — Z148 Genetic carrier of other disease: Secondary | ICD-10-CM

## 2023-06-10 DIAGNOSIS — O3663X0 Maternal care for excessive fetal growth, third trimester, not applicable or unspecified: Secondary | ICD-10-CM

## 2023-06-10 DIAGNOSIS — O0993 Supervision of high risk pregnancy, unspecified, third trimester: Secondary | ICD-10-CM | POA: Diagnosis not present

## 2023-06-10 DIAGNOSIS — O99013 Anemia complicating pregnancy, third trimester: Secondary | ICD-10-CM | POA: Diagnosis not present

## 2023-06-10 DIAGNOSIS — O4703 False labor before 37 completed weeks of gestation, third trimester: Secondary | ICD-10-CM | POA: Diagnosis not present

## 2023-06-10 DIAGNOSIS — Z3A32 32 weeks gestation of pregnancy: Secondary | ICD-10-CM | POA: Diagnosis not present

## 2023-06-10 DIAGNOSIS — D563 Thalassemia minor: Secondary | ICD-10-CM | POA: Diagnosis not present

## 2023-06-10 DIAGNOSIS — Z79899 Other long term (current) drug therapy: Secondary | ICD-10-CM

## 2023-06-10 DIAGNOSIS — R42 Dizziness and giddiness: Secondary | ICD-10-CM

## 2023-06-10 DIAGNOSIS — O219 Vomiting of pregnancy, unspecified: Secondary | ICD-10-CM

## 2023-06-10 DIAGNOSIS — O0990 Supervision of high risk pregnancy, unspecified, unspecified trimester: Principal | ICD-10-CM

## 2023-06-10 LAB — URINALYSIS, ROUTINE W REFLEX MICROSCOPIC
Bilirubin Urine: NEGATIVE
Glucose, UA: NEGATIVE mg/dL
Hgb urine dipstick: NEGATIVE
Ketones, ur: NEGATIVE mg/dL
Leukocytes,Ua: NEGATIVE
Nitrite: NEGATIVE
Protein, ur: NEGATIVE mg/dL
Specific Gravity, Urine: 1.013 (ref 1.005–1.030)
pH: 7 (ref 5.0–8.0)

## 2023-06-10 LAB — COMPREHENSIVE METABOLIC PANEL
ALT: 14 U/L (ref 0–44)
AST: 20 U/L (ref 15–41)
Albumin: 3.2 g/dL — ABNORMAL LOW (ref 3.5–5.0)
Alkaline Phosphatase: 148 U/L — ABNORMAL HIGH (ref 38–126)
Anion gap: 9 (ref 5–15)
BUN: <5 mg/dL — ABNORMAL LOW (ref 6–20)
CO2: 24 mmol/L (ref 22–32)
Calcium: 9.5 mg/dL (ref 8.9–10.3)
Chloride: 103 mmol/L (ref 98–111)
Creatinine, Ser: 0.56 mg/dL (ref 0.44–1.00)
GFR, Estimated: >60 mL/min (ref >60)
Glucose, Bld: 79 mg/dL (ref 70–99)
Potassium: 4.1 mmol/L (ref 3.5–5.1)
Sodium: 136 mmol/L (ref 135–145)
Total Bilirubin: 0.5 mg/dL (ref 0.0–1.2)
Total Protein: 7 g/dL (ref 6.5–8.1)

## 2023-06-10 LAB — CBC WITH DIFFERENTIAL/PLATELET
Abs Immature Granulocytes: 0.08 10*3/uL — ABNORMAL HIGH (ref 0.00–0.07)
Basophils Absolute: 0 10*3/uL (ref 0.0–0.1)
Basophils Relative: 0 %
Eosinophils Absolute: 0.1 10*3/uL (ref 0.0–0.5)
Eosinophils Relative: 1 %
HCT: 37.7 % (ref 36.0–46.0)
Hemoglobin: 11.8 g/dL — ABNORMAL LOW (ref 12.0–15.0)
Immature Granulocytes: 1 %
Lymphocytes Relative: 17 %
Lymphs Abs: 1.6 10*3/uL (ref 0.7–4.0)
MCH: 23.5 pg — ABNORMAL LOW (ref 26.0–34.0)
MCHC: 31.3 g/dL (ref 30.0–36.0)
MCV: 75 fL — ABNORMAL LOW (ref 80.0–100.0)
Monocytes Absolute: 0.6 10*3/uL (ref 0.1–1.0)
Monocytes Relative: 7 %
Neutro Abs: 6.7 10*3/uL (ref 1.7–7.7)
Neutrophils Relative %: 74 %
Platelets: 301 10*3/uL (ref 150–400)
RBC: 5.03 MIL/uL (ref 3.87–5.11)
RDW: 13.5 % (ref 11.5–15.5)
WBC: 9.1 10*3/uL (ref 4.0–10.5)
nRBC: 0 % (ref 0.0–0.2)

## 2023-06-10 LAB — WET PREP, GENITAL
Clue Cells Wet Prep HPF POC: NONE SEEN
Sperm: NONE SEEN
Trich, Wet Prep: NONE SEEN
WBC, Wet Prep HPF POC: <10 (ref <10)
Yeast Wet Prep HPF POC: NONE SEEN

## 2023-06-10 LAB — TYPE AND SCREEN
ABO/RH(D): B POS
Antibody Screen: NEGATIVE

## 2023-06-10 LAB — RUPTURE OF MEMBRANE (ROM)PLUS: Rom Plus: NEGATIVE

## 2023-06-10 LAB — OB RESULTS CONSOLE GBS: GBS: POSITIVE

## 2023-06-10 MED ORDER — LACTATED RINGERS IV SOLN: INTRAVENOUS | Status: AC

## 2023-06-10 MED ORDER — MAGNESIUM SULFATE 40 GM/1000ML IV SOLN
2.0000 g/h | INTRAVENOUS | Status: AC
  Administered 2023-06-10 – 2023-06-11 (×2): 2 g/h via INTRAVENOUS
  Filled 2023-06-10 (×2): qty 1000

## 2023-06-10 MED ORDER — BETAMETHASONE SOD PHOS & ACET 6 (3-3) MG/ML IJ SUSP
12.0000 mg | Freq: Once | INTRAMUSCULAR | Status: AC
  Administered 2023-06-10: 12 mg via INTRAMUSCULAR
  Filled 2023-06-10: qty 5

## 2023-06-10 MED ORDER — MAGNESIUM SULFATE BOLUS VIA INFUSION
4.0000 g | Freq: Once | INTRAVENOUS | Status: AC
Start: 2023-06-10 — End: 2023-06-10
  Administered 2023-06-10: 4 g via INTRAVENOUS
  Filled 2023-06-10: qty 1000

## 2023-06-10 MED ORDER — DOCUSATE SODIUM 100 MG PO CAPS
100.0000 mg | ORAL_CAPSULE | Freq: Every day | ORAL | Status: DC
  Administered 2023-06-10: 100 mg via ORAL
  Filled 2023-06-10: qty 1

## 2023-06-10 MED ORDER — INDOMETHACIN 25 MG PO CAPS: 100.0000 mg | ORAL_CAPSULE | Freq: Once | ORAL | Status: DC

## 2023-06-10 MED ORDER — CALCIUM CARBONATE ANTACID 500 MG PO CHEW: 2.0000 | CHEWABLE_TABLET | ORAL | Status: DC | PRN

## 2023-06-10 MED ORDER — MAGNESIUM SULFATE 4 GM/100ML IV SOLN: 4.0000 g | Freq: Once | INTRAVENOUS | Status: DC

## 2023-06-10 MED ORDER — BETAMETHASONE SOD PHOS & ACET 6 (3-3) MG/ML IJ SUSP
12.0000 mg | Freq: Once | INTRAMUSCULAR | Status: AC
  Administered 2023-06-11: 12 mg via INTRAMUSCULAR
  Filled 2023-06-10: qty 5

## 2023-06-10 MED ORDER — ONDANSETRON 4 MG PO TBDP
8.0000 mg | ORAL_TABLET | Freq: Three times a day (TID) | ORAL | Status: DC | PRN
  Administered 2023-06-11 – 2023-06-12 (×2): 8 mg via ORAL
  Filled 2023-06-10 (×2): qty 2

## 2023-06-10 MED ORDER — ENOXAPARIN SODIUM 40 MG/0.4ML IJ SOSY
40.0000 mg | PREFILLED_SYRINGE | INTRAMUSCULAR | Status: DC
  Administered 2023-06-10: 40 mg via SUBCUTANEOUS
  Filled 2023-06-10 (×2): qty 0.4

## 2023-06-10 MED ORDER — LACTATED RINGERS IV SOLN: 125.0000 mL/h | INTRAVENOUS | Status: AC

## 2023-06-10 MED ORDER — INDOMETHACIN 25 MG PO CAPS
100.0000 mg | ORAL_CAPSULE | Freq: Once | ORAL | Status: AC
  Administered 2023-06-10: 100 mg via ORAL
  Filled 2023-06-10: qty 4

## 2023-06-10 MED ORDER — ONDANSETRON 4 MG PO TBDP: 4.0000 mg | ORAL_TABLET | Freq: Three times a day (TID) | ORAL | 2 refills | Status: DC | PRN

## 2023-06-10 MED ORDER — ACETAMINOPHEN 325 MG PO TABS
650.0000 mg | ORAL_TABLET | ORAL | Status: DC | PRN
  Administered 2023-06-11 (×2): 650 mg via ORAL
  Filled 2023-06-10 (×2): qty 2

## 2023-06-10 MED ORDER — PRENATAL MULTIVITAMIN CH
1.0000 | ORAL_TABLET | Freq: Every day | ORAL | Status: DC
  Administered 2023-06-10 – 2023-06-12 (×3): 1 via ORAL
  Filled 2023-06-10 (×3): qty 1

## 2023-06-10 NOTE — Patient Instructions (Signed)
 [facility]aternity [facility] [place]

## 2023-06-10 NOTE — Progress Notes (Signed)
 PRENATAL VISIT NOTE  Subjective:  Veronica Buchanan is a 28 y.o. G1P0000 at [redacted]w[redacted]d being seen today for ongoing prenatal care.  She is currently monitored for the following issues for this low-risk pregnancy and has Endometriosis; History of asthma; History of migraine; History of bipolar disorder; Nausea; Supervision of high risk pregnancy, antepartum; Excessive daytime sleepiness; Abnormal dreams; Recurrent isolated sleep paralysis; Cataplexy; Anxiety; Thalassemia alpha carrier; and LGA (large for gestational age) fetus affecting management of mother on their problem list.  Patient reports contractions since last night. She reports feeling unwell in general for several days and then last night had ctx but drank water and then they went away. However this am in centering they returned. RN instructed on how to time contractions and they are every 2-3 minutes .  Contractions: Irritability. Vag. Bleeding: None.  Movement: Present. Denies leaking of fluid.   The following portions of the patient's history were reviewed and updated as appropriate: allergies, current medications, past family history, past medical history, past social history, past surgical history and problem list.   Objective:   Vitals:   06/10/23 0931  BP: 96/69  Pulse: (!) 106  Weight: 163 lb 3.2 oz (74 kg)    Fetal Status: Fetal Heart Rate (bpm): 135 Fundal Height: 31 cm Movement: Present  Presentation: Vertex  General:  Alert, oriented and cooperative. Patient is in no acute distress.  Skin: Skin is warm and dry. No rash noted.   Cardiovascular: Normal heart rate noted  Respiratory: Normal respiratory effort, no problems with respiration noted  Abdomen: Soft, gravid, appropriate for gestational age.  Pain/Pressure: Absent     Pelvic: Cervical exam deferred Dilation: 3.5 Effacement (%): 50 Station: -2 (Anterior)  Extremities: Normal range of motion.  Edema: None  Mental Status: Normal mood and affect. Normal behavior. Normal  judgment and thought content.   Assessment and Plan:  Pregnancy: G1P0000 at [redacted]w[redacted]d 1. Lightheadedness - CBC - Comp Met (CMET)  2. Nausea and vomiting during pregnancy - ondansetron  (ZOFRAN -ODT) 4 MG disintegrating tablet; Take 1-2 tablets (4-8 mg total) by mouth every 8 (eight) hours as needed for nausea.  Dispense: 30 tablet; Refill: 2  3. Excessive fetal growth affecting management of pregnancy in third trimester, single or unspecified fetus LGA -62nd% on 11/25  4. Supervision of high risk pregnancy, antepartum   Centering Pregnancy, Session#8: Reviewed resources in [employer].   Facilitated discussion today: Induction process, labor Newborn safety, delivery planning, postpartum planning and follow other topics (family planning, breastfeeding)  Fundal height and FHR appropriate today unless noted otherwise in plan. Patient to continue group care.    5. Threatened preterm labor, antepartum (Primary) Checked in the office  3.5/50/-2 Discussed need to go to the hospital because she is in preterm labor Reviewed admission and recommendation for BMZ, tocolysis MAU team alerted to patient  She needs to go home and get something and then will go straight to the hospital. I recommended going ASAP.     Preterm labor symptoms and general obstetric precautions including but not limited to vaginal bleeding, contractions, leaking of fluid and fetal movement were reviewed in detail with the patient. Please refer to After Visit Summary for other counseling recommendations.   Return in about 2 weeks (around 06/24/2023) for Centering Pregnancy.  Future Appointments  Date Time Provider Department Center  06/24/2023  9:00 AM CENTERING PROVIDER [facility]  07/08/2023  9:00 AM CENTERING PROVIDER [facility]  07/15/2023  8:55 AM Raynell Caller, MD [facility]  07/22/2023  8:55 AM Raynell Caller, MD [facility]    Abner Ables, MD

## 2023-06-10 NOTE — MAU Note (Signed)
 Veronica Buchanan is a 28 y.o. at [redacted]w[redacted]d here in MAU reporting: sent from office for evaluation secondary having ctxs and cervix was dilated 3 cms.  Denies VB, underwear were a little wet after cervical exam.  Endorses +FM.  LMP: NA Onset of complaint: last night Pain score: 6 Vitals:   06/10/23 1246  BP: 112/72  Pulse: 95  Resp: 18  Temp: 98.2 F (36.8 C)  SpO2: 100%     FHT:139 bpm Lab orders placed from triage: None

## 2023-06-10 NOTE — H&P (Signed)
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           Appears normal         Mandible:               Appears normal  Nasal Bone:            Present                Maxilla:                Appears normal  Thorax  4 Chamber View:        Appears normal         SVC:                    Appears normal  Cardiac Activity:      Observed               Interventr. Septum:     Appears normal  Cardiac Rhythm:         Normal                 Cardiac Axis:           Normal  Cardiac Situs:         Appears normal  Diaphragm:              Appears normal  Rt Outflow Tract:      Appears normal         3 Vessel View:          Appears normal  Lt Outflow Tract:      Appears normal         3 V Trachea View:       Appears normal  Aortic Arch:           Appears normal         IVC:                    Appears normal  Ductal Arch:           Appears normal         Crossing:               Appears normal  Abdomen  Ventral Wall:          Appears normal         Lt Kidney:              Appears normal  Cord Insertion:        Appears normal         Rt Kidney:              Appears normal  Situs:                 Appears normal         Bladder:                Appears normal  Stomach:               Appears normal  Extremities  Lt Humerus:            Appears normal         Lt Femur:               Appears normal  Rt Humerus:            Appears normal         Rt Femur:               Appears normal  Lt Forearm:            Appears normal         Lt Lower Leg:           Appears normal  Rt Forearm:            Appears normal         Rt Lower Leg:           Appears normal  Lt Hand:               Open hand nml          Lt Foot:                Nml heel/foot  Rt Hand:               Not well visualized    Rt Foot:                Nml heel/foot  Other  Umbilical Cord:        Normal 3-vessel        Genitalia:              Female-nml ---------------------------------------------------------------------- Cervix Uterus  Adnexa  Cervix  Length:            3.9  cm.  Normal appearance by transabdominal scan  Uterus  No abnormality visualized.  Right Ovary  Within normal limits.  Left Ovary  Within normal limits.  Adnexa  No abnormality visualized ---------------------------------------------------------------------- Comments  This patient was seen for a detailed fetal anatomy scan.  She denies any significant past medical history and denies  any problems in her current  pregnancy.  She had a cell free DNA test earlier in her pregnancy which  indicated a low risk for trisomy 54, 28, and 13. A female fetus is  predicted.  She was informed that the fetal growth and amniotic fluid  level were appropriate for her gestational age.  There were no obvious fetal anomalies noted on today's  ultrasound exam.  However, the views of the fetal anatomy  were limited today due to the fetal position.  The patient was informed that anomalies may be missed due  to technical limitations. If the fetus is in a suboptimal position  or maternal habitus is increased, visualization of the fetus in  the maternal uterus may be impaired.  A follow-up exam was scheduled in 5 weeks to complete the  views of the fetal anatomy. ----------------------------------------------------------------------                   Sal Crass, MD Electronically Signed Final Report   03/16/2023 10:49 am ----------------------------------------------------------------------    Assessment and Plan: Principal Problem:   Threatened preterm labor, third trimester Active Problems:   Supervision of high risk pregnancy, antepartum   [redacted] weeks gestation of pregnancy   Preterm labor in third trimester  Admit to Antenatal Unit Betamethasone  #2 ordered for 06/11/23 around 1300 Magnesium  sulfate infusion ordered for tocolysis and neuroprotection Neonatology team consulted. Will recheck presentation if she does progress in preterm labor to determine route of delivery. Routine antenatal care   Lenoard Rad, MD, FACOG Attending Obstetrician & Gynecologist Faculty Practice, [facility]

## 2023-06-10 NOTE — MAU Provider Note (Signed)
 PTL     S Ms. Viveca Raitz is a 28 y.o. G1P0000 pregnant female at [redacted]w[redacted]d who presents to MAU today after being sent from clinic after report CTX 2-90mins and CVE 3.5/50/-2. Denies VB.  Endorses +FM.  States had no LOF but noticed underwear wet after CVE at clinic. She states that she still feels contractions occasionally but that they've spaced significantly.   Receives care at [facility]. Prenatal records reviewed.  Pertinent items noted in HPI and remainder of comprehensive ROS otherwise negative.   O BP 107/69   Pulse 98   Temp 98.2 F (36.8 C) (Oral)   Resp 16   Ht 5\' 6"  (1.676 m)   Wt 73.1 kg   LMP 10/31/2022 (Exact Date)   SpO2 100%   BMI 26.00 kg/m  Physical Exam Vitals and nursing note reviewed. Exam conducted with a chaperone present.  Constitutional:      General: She is not in acute distress.    Appearance: Normal appearance. She is not ill-appearing.  HENT:     Head: Normocephalic and atraumatic.     Right Ear: External ear normal.     Left Ear: External ear normal.     Nose: Nose normal.     Mouth/Throat:     Mouth: Mucous membranes are moist.     Pharynx: Oropharynx is clear.  Eyes:     Extraocular Movements: Extraocular movements intact.     Conjunctiva/sclera: Conjunctivae normal.  Cardiovascular:     Rate and Rhythm: Normal rate.  Pulmonary:     Effort: Pulmonary effort is normal. No respiratory distress.  Abdominal:     General: There is no distension.     Tenderness: There is no abdominal tenderness.     Comments: gravid  Genitourinary:    General: Normal vulva.     Rectum: Normal.     Comments: Normal female genitalia, no lesion, no pooling, CVE 3/50/-3 Musculoskeletal:        General: No swelling. Normal range of motion.     Cervical back: Normal range of motion.  Skin:    General: Skin is warm and dry.  Neurological:     Mental Status: She is alert and oriented to person, place, and time. Mental status is at baseline.     Motor: No  weakness.     Gait: Gait normal.  Psychiatric:        Mood and Affect: Mood normal.        Behavior: Behavior normal.    NST: 130bpm, moderate variability, +accels, no decels, no contractions  MDM: MAU Course:  Fern neg Pooling neg ROM plus pending at time of admission to ante CVE unchanged from clinic No VB GBS collected prior to admission to ante   Pt s/p BMX 1300, receiving neuro-protective mag, s/p indomethacin  100mg  1328.  Stable for admission to antepartum.    A/P: #[redacted] weeks gestation #PTL -stable for now and will be admitted to ante for continued evaluation and steroid completion - BMX ordered for 1/16 @ 1300 - continue mag at 2g/hr  - Indomethacin  25mg  Q4-6hrs PRN for breakthrough contractions, would use with caution as for gestational age  - ordered placed for NICU consult  - f/u GBS    Ebony Goldstein, MD 06/10/2023 2:24 PM

## 2023-06-11 ENCOUNTER — Inpatient Hospital Stay (HOSPITAL_COMMUNITY): Payer: 59

## 2023-06-11 DIAGNOSIS — Z3A31 31 weeks gestation of pregnancy: Secondary | ICD-10-CM

## 2023-06-11 DIAGNOSIS — D563 Thalassemia minor: Secondary | ICD-10-CM

## 2023-06-11 DIAGNOSIS — O99013 Anemia complicating pregnancy, third trimester: Secondary | ICD-10-CM

## 2023-06-11 LAB — CBC
Hematocrit: 35.6 % (ref 34.0–46.6)
Hemoglobin: 11.1 g/dL (ref 11.1–15.9)
MCH: 22.9 pg — ABNORMAL LOW (ref 26.6–33.0)
MCHC: 31.2 g/dL — ABNORMAL LOW (ref 31.5–35.7)
MCV: 73 fL — ABNORMAL LOW (ref 79–97)
Platelets: 299 10*3/uL (ref 150–450)
RBC: 4.85 x10E6/uL (ref 3.77–5.28)
RDW: 12.1 % (ref 11.7–15.4)
WBC: 8.3 10*3/uL (ref 3.4–10.8)

## 2023-06-11 LAB — COMPREHENSIVE METABOLIC PANEL
ALT: 11 [IU]/L (ref 0–32)
AST: 20 [IU]/L (ref 0–40)
Albumin: 3.7 g/dL — ABNORMAL LOW (ref 4.0–5.0)
Alkaline Phosphatase: 154 [IU]/L — ABNORMAL HIGH (ref 44–121)
BUN/Creatinine Ratio: 10 (ref 9–23)
BUN: 6 mg/dL (ref 6–20)
Bilirubin Total: <0.2 mg/dL (ref 0.0–1.2)
CO2: 21 mmol/L (ref 20–29)
Calcium: 9.4 mg/dL (ref 8.7–10.2)
Chloride: 101 mmol/L (ref 96–106)
Creatinine, Ser: 0.61 mg/dL (ref 0.57–1.00)
Globulin, Total: 2.7 g/dL (ref 1.5–4.5)
Glucose: 77 mg/dL (ref 70–99)
Potassium: 4.3 mmol/L (ref 3.5–5.2)
Sodium: 137 mmol/L (ref 134–144)
Total Protein: 6.4 g/dL (ref 6.0–8.5)
eGFR: 126 mL/min/{1.73_m2} (ref >59)

## 2023-06-11 LAB — GC/CHLAMYDIA PROBE AMP (~~LOC~~) NOT AT ARMC
Chlamydia: NEGATIVE
Comment: NEGATIVE
Comment: NORMAL
Neisseria Gonorrhea: NEGATIVE

## 2023-06-11 LAB — RPR: RPR Ser Ql: NONREACTIVE

## 2023-06-11 MED ORDER — INDOMETHACIN 25 MG PO CAPS
25.0000 mg | ORAL_CAPSULE | Freq: Four times a day (QID) | ORAL | Status: AC
  Administered 2023-06-11 (×2): 25 mg via ORAL
  Filled 2023-06-11 (×2): qty 1

## 2023-06-11 MED ORDER — TERBUTALINE SULFATE 1 MG/ML IJ SOLN
0.2500 mg | Freq: Once | INTRAMUSCULAR | Status: AC
  Administered 2023-06-11: 0.25 mg via SUBCUTANEOUS
  Filled 2023-06-11: qty 1

## 2023-06-11 MED ORDER — NIFEDIPINE 10 MG PO CAPS
10.0000 mg | ORAL_CAPSULE | Freq: Four times a day (QID) | ORAL | Status: DC | PRN
  Administered 2023-06-11: 10 mg via ORAL
  Filled 2023-06-11: qty 1

## 2023-06-11 MED ORDER — NIFEDIPINE 10 MG PO CAPS
10.0000 mg | ORAL_CAPSULE | Freq: Four times a day (QID) | ORAL | Status: AC
  Administered 2023-06-11 – 2023-06-12 (×3): 10 mg via ORAL
  Filled 2023-06-11 (×4): qty 1

## 2023-06-11 NOTE — Progress Notes (Signed)
OB note 06/11/2023 1345 Went to see patient after increased pressure after getting her u/s just now; pt not on monitor yet.   SVE: baby's head low in the vagina, cervix off to patient's right but unchanged at 3-4/70/0 station, feels intact/ head palpated  BMZ #2 at 1400 and can d/c Mg then but will finish indocin course until 32wks tonight and continue scheduled q6h procardia until mid day tomorrow. Will call NICU to make sure they know about her.   U/s reading pending but prelim afi 13, efw 69%, 2056g, ac 86%  Cornelia Copa MD Attending Center for [facility] (Faculty Practice) GYN Consult Phone: [phone] (M-F, [phone]) & [phone] (Off hours, weekends, holidays)

## 2023-06-11 NOTE — Progress Notes (Signed)
Patient ID: Veronica Buchanan, female   DOB: February 01, 1996, 28 y.o.   MRN: [id number] FACULTY PRACTICE ANTEPARTUM(COMPREHENSIVE) NOTE  Veronica Buchanan is a 28 y.o. G1P0000 with Estimated Date of Delivery: 08/07/23   By  early ultrasound [redacted]w[redacted]d  who is admitted for Preterm labor.    Fetal presentation is cephalic. Length of Stay:  1  Days  Date of admission:06/10/2023  Subjective: No complaints, contractions have resolved Patient reports the fetal movement as active. Patient reports uterine contraction  activity as none. Patient reports  vaginal bleeding as none. Patient describes fluid per vagina as None.  Vitals:  Blood pressure (!) 106/57, pulse 96, temperature 97.7 F (36.5 C), temperature source Oral, resp. rate 17, height 5\' 6"  (1.676 m), weight 73.1 kg, last menstrual period 10/31/2022, SpO2 99%. Vitals:   06/11/23 0420 06/11/23 0530 06/11/23 0620 06/11/23 0744  BP: 119/70   (!) 106/57  Pulse: 89   96  Resp:  20 16 17   Temp: (!) 97.5 F (36.4 C)   97.7 F (36.5 C)  TempSrc: Axillary   Oral  SpO2: 100%   99%  Weight:      Height:       Physical Examination:  General appearance - alert, well appearing, and in no distress Abdomen - soft, nontender, nondistended, no masses or organomegaly Fundal Height:  size equals dates Pelvic Exam:  examination not indicated Cervical Exam: Not evaluated. . Extremities: extremities normal, atraumatic, no cyanosis or edema with DTRs 2+ bilaterally Membranes:intact  Fetal Monitoring:  Baseline: 130s bpm, Variability: Good {> 6 bpm), Accelerations: Reactive, and Decelerations: Absent   reactive  Labs:  Results for orders placed or performed during the hospital encounter of 06/10/23 (from the past 24 hours)  CBC with Differential/Platelet   Collection Time: 06/10/23  1:13 PM  Result Value Ref Range   WBC 9.1 4.0 - 10.5 K/uL   RBC 5.03 3.87 - 5.11 MIL/uL   Hemoglobin 11.8 (L) 12.0 - 15.0 g/dL   HCT 57.8 46.9 - 62.9 %   MCV 75.0 (L) 80.0 - 100.0 fL    MCH 23.5 (L) 26.0 - 34.0 pg   MCHC 31.3 30.0 - 36.0 g/dL   RDW 52.8 41.3 - 24.4 %   Platelets 301 150 - 400 K/uL   nRBC 0.0 0.0 - 0.2 %   Neutrophils Relative % 74 %   Neutro Abs 6.7 1.7 - 7.7 K/uL   Lymphocytes Relative 17 %   Lymphs Abs 1.6 0.7 - 4.0 K/uL   Monocytes Relative 7 %   Monocytes Absolute 0.6 0.1 - 1.0 K/uL   Eosinophils Relative 1 %   Eosinophils Absolute 0.1 0.0 - 0.5 K/uL   Basophils Relative 0 %   Basophils Absolute 0.0 0.0 - 0.1 K/uL   Immature Granulocytes 1 %   Abs Immature Granulocytes 0.08 (Veronica) 0.00 - 0.07 K/uL  Comprehensive metabolic panel   Collection Time: 06/10/23  1:13 PM  Result Value Ref Range   Sodium 136 135 - 145 mmol/L   Potassium 4.1 3.5 - 5.1 mmol/L   Chloride 103 98 - 111 mmol/L   CO2 24 22 - 32 mmol/L   Glucose, Bld 79 70 - 99 mg/dL   BUN <5 (L) 6 - 20 mg/dL   Creatinine, Ser 0.10 0.44 - 1.00 mg/dL   Calcium 9.5 8.9 - 27.2 mg/dL   Total Protein 7.0 6.5 - 8.1 g/dL   Albumin 3.2 (L) 3.5 - 5.0 g/dL   AST 20 15 - 41 U/L  ALT 14 0 - 44 U/L   Alkaline Phosphatase 148 (Veronica) 38 - 126 U/L   Total Bilirubin 0.5 0.0 - 1.2 mg/dL   GFR, Estimated >09 >81 mL/min   Anion gap 9 5 - 15  Type and screen [facility]   Collection Time: 06/10/23  1:13 PM  Result Value Ref Range   ABO/RH(D) B POS    Antibody Screen NEG    Sample Expiration      06/13/2023,2359 Performed at [facility], [address] [place], [place] [address]   Wet prep, genital   Collection Time: 06/10/23  2:22 PM   Specimen: PATH Cytology Cervicovaginal Ancillary Only  Result Value Ref Range   Yeast Wet Prep HPF POC NONE SEEN NONE SEEN   Trich, Wet Prep NONE SEEN NONE SEEN   Clue Cells Wet Prep HPF POC NONE SEEN NONE SEEN   WBC, Wet Prep HPF POC <10 <10   Sperm NONE SEEN   Rupture of Membrane (ROM) Plus   Collection Time: 06/10/23  2:22 PM  Result Value Ref Range   Rom Plus NEGATIVE   Urinalysis, Routine w reflex microscopic -Urine, Clean Catch    Collection Time: 06/10/23  2:22 PM  Result Value Ref Range   Color, Urine YELLOW YELLOW   APPearance CLEAR CLEAR   Specific Gravity, Urine 1.013 1.005 - 1.030   pH 7.0 5.0 - 8.0   Glucose, UA NEGATIVE NEGATIVE mg/dL   Hgb urine dipstick NEGATIVE NEGATIVE   Bilirubin Urine NEGATIVE NEGATIVE   Ketones, ur NEGATIVE NEGATIVE mg/dL   Protein, ur NEGATIVE NEGATIVE mg/dL   Nitrite NEGATIVE NEGATIVE   Leukocytes,Ua NEGATIVE NEGATIVE  Results for orders placed or performed in visit on 06/10/23 (from the past 24 hours)  CBC   Collection Time: 06/10/23 10:00 AM  Result Value Ref Range   WBC 8.3 3.4 - 10.8 x10E3/uL   RBC 4.85 3.77 - 5.28 x10E6/uL   Hemoglobin 11.1 11.1 - 15.9 g/dL   Hematocrit 82.9 56.2 - 46.6 %   MCV 73 (L) 79 - 97 fL   MCH 22.9 (L) 26.6 - 33.0 pg   MCHC 31.2 (L) 31.5 - 35.7 g/dL   RDW 13.0 86.5 - 78.4 %   Platelets 299 150 - 450 x10E3/uL  Comp Met (CMET)   Collection Time: 06/10/23 10:00 AM  Result Value Ref Range   Glucose 77 70 - 99 mg/dL   BUN 6 6 - 20 mg/dL   Creatinine, Ser 6.96 0.57 - 1.00 mg/dL   eGFR 295 >28 UX/LKG/4.01   BUN/Creatinine Ratio 10 9 - 23   Sodium 137 134 - 144 mmol/L   Potassium 4.3 3.5 - 5.2 mmol/L   Chloride 101 96 - 106 mmol/L   CO2 21 20 - 29 mmol/L   Calcium 9.4 8.7 - 10.2 mg/dL   Total Protein 6.4 6.0 - 8.5 g/dL   Albumin 3.7 (L) 4.0 - 5.0 g/dL   Globulin, Total 2.7 1.5 - 4.5 g/dL   Bilirubin Total <0.2 0.0 - 1.2 mg/dL   Alkaline Phosphatase 154 (Veronica) 44 - 121 IU/L   AST 20 0 - 40 IU/L   ALT 11 0 - 32 IU/L    Imaging Studies:    No results found.   Medications:  Scheduled  betamethasone acetate-betamethasone sodium phosphate  12 mg Intramuscular Once   indomethacin  25 mg Oral Q6H   prenatal multivitamin  1 tablet Oral Q1200   I have reviewed the patient's  current medications.  ASSESSMENT: G1P0000 [redacted]w[redacted]d Estimated Date of Delivery: 08/07/23  Preterm labor: cervix 3 cm  PLAN: >S/P magnesium tocolysis >BMZ second dose  today  Unsure of discharge timing based on her cercial exam, will reassess tomorrow  Veronica Buchanan Veronica Buchanan 06/11/2023,8:50 AM

## 2023-06-11 NOTE — Progress Notes (Signed)
OB Note 06/11/2023 1140am Called by RN for abdominal pain/contractions but I was in the OR. Dr. Nobie Putnam came and check her and same dilation at 3-4cm/?more effaced at 70% and lower at -1. Pt states she's comfortable and feels much better. Patient is small and no UCs on the monitor and baby category with accels. Continue continuous EFM and Mg until BMZ#2 at 1400 and if still doing well okay to come off monitor.  Cornelia Copa MD Attending Center for [facility] (Faculty Practice) GYN Consult Phone: [phone] (M-F, [phone]) & [phone] (Off hours, weekends, holidays)

## 2023-06-11 NOTE — Consult Note (Signed)
Neonatology Consult to Antenatal Patient:  I was asked by Dr. Vergie Living to see this patient in order to provide antenatal counseling due to prematurity threatening preterm delivery.  [name] was admitted 06/11/2023 at [redacted]w[redacted]d weeks GA for preterm labor. She is currently not having active labor; membranes are intact. Fetal status is reassuring. She is now just BTMZ complete and has received prophylactic IV Mag.  Procardia about to be started.  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  [place] notable for EFW 2058g and AFI 13.    I spoke with the patient and her [place] with fiance (FOB) on the phone. We discussed the worst case of delivery in the next 1-2 days, including usual DR management, possible respiratory complications and need for support, IV access, feedings (mother desires breast feeding, which was encouraged, and use of donor breast milk as a bridge to which she agrees), LOS, Mortality and Morbidity, and long term outcomes.  Excellent questions were answered to the [place] of my ability.  I/we would be glad to come back if there are more questions later.  Thank you for asking me to see this patient.  Dineen Kid Leary Roca, MD Neonatologist 06/11/2023, 5:19 PM  The total length of face-to-face or floor/unit time for this encounter was 25 minutes. Counseling and/or coordination of care was 40 minutes of the above.

## 2023-06-12 DIAGNOSIS — Z3A32 32 weeks gestation of pregnancy: Secondary | ICD-10-CM

## 2023-06-12 LAB — CULTURE, BETA STREP (GROUP B ONLY)

## 2023-06-12 MED ORDER — NIFEDIPINE 10 MG PO CAPS
10.0000 mg | ORAL_CAPSULE | Freq: Three times a day (TID) | ORAL | Status: DC | PRN
  Administered 2023-06-12: 10 mg via ORAL
  Filled 2023-06-12: qty 1

## 2023-06-12 MED ORDER — POLYETHYLENE GLYCOL 3350 17 G PO PACK: 17.0000 g | PACK | Freq: Every day | ORAL | Status: DC | PRN

## 2023-06-12 NOTE — Progress Notes (Signed)
Daily Antepartum Note  Admission Date: 06/10/2023 Current Date: 06/12/2023 9:00 AM  Veronica Buchanan is a 28 y.o. G1P0000 at [redacted]w[redacted]d, admitted for PTL.  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   Objective:    Current Vital Signs 24h Vital Sign Ranges  T 98.1 F (36.7 C) Temp  Avg: 98.1 F (36.7 C)  Min: 97.9 F (36.6 C)  Max: 98.1 F (36.7 C)  BP 101/63 BP  Min: 101/63  Max: 115/62  HR 89 Pulse  Avg: 96.5  Min: 86  Max: 120  RR 16 Resp  Avg: 18  Min: 16  Max: 20  SaO2 100 % Room Air SpO2  Avg: 100 %  Min: 100 %  Max: 100 %       24 Hour I/O Current Shift I/O  Time Ins Outs 01/16 0701 - 01/17 0700 In: 603 [P.O.:320; I.V.:283] Out: 2900 [Urine:2900] No intake/output data recorded.   Patient Vitals for the past 24 hrs:  BP Temp Temp src Pulse Resp SpO2  06/12/23 0613 101/63 98.1 F (36.7 C) Axillary 89 16 100 %  06/12/23 0612 101/63 -- -- -- -- --  06/11/23 2012 113/65 98.1 F (36.7 C) Oral 86 18 100 %  06/11/23 1706 (!) 113/49 97.9 F (36.6 C) -- 91 18 100 %  06/11/23 1152 115/62 98.1 F (36.7 C) Oral (!) 120 20 100 %   Fetal Heart Tones: 140 baseline, +accels, no decel, mod variability Tocometry: quiet  Physical exam: General: Well nourished, well developed female in no  acute distress. Abdomen: gravid nttp Cardiovascular: S1, S2 normal, no murmur, rub or gallop, regular rate and rhythm Respiratory: CTAB Extremities: no clubbing, cyanosis or edema Skin: Warm and dry.   Medications: Current Facility-Administered Medications  Medication Dose Route Frequency Provider Last Rate Last Admin   acetaminophen (TYLENOL) tablet 650 mg  650 mg Oral Q4H PRN Hessie Dibble, MD   650 mg at 06/11/23 0950   calcium carbonate (TUMS - dosed in mg elemental calcium) chewable tablet 400 mg of elemental calcium  2 tablet Oral Q4H PRN Hessie Dibble, MD       NIFEdipine (PROCARDIA) capsule 10 mg  10 mg Oral Q6H [place] Bing, MD   10 mg at 06/12/23 0612   ondansetron (ZOFRAN-ODT) disintegrating tablet 8 mg  8 mg Oral Q8H PRN Hessie Dibble, MD   8 mg at 06/11/23 0453   prenatal multivitamin tablet 1 tablet  1 tablet Oral Q1200 Hessie Dibble, MD   1 tablet at 06/11/23 1242    Labs:  Recent Labs  Lab 06/10/23 1000 06/10/23 1313  WBC 8.3 9.1  HGB 11.1 11.8*  HCT 35.6 37.7  PLT 299 301    Recent Labs  Lab 06/10/23 1000 06/10/23 1313  NA 137 136  K 4.3 4.1  CL 101 103  CO2 21 24  BUN 6 <5*  CREATININE 0.61 0.56  CALCIUM 9.4 9.5  PROT 6.4 7.0  BILITOT <0.2 0.5  ALKPHOS 154* 148*  ALT 11 14  AST 20 20  GLUCOSE 77 79   Radiology:  No new imaging 1/17: ceph, efw 75%, 2058gm, ac 89%, afi 13  Assessment & Plan:  Patient stable *Pregnancy: reactive NST, fetal status reassuring; qday NSTs *PTL: d/w her at least a week of obs *Preterm: s/p NICU consult, Mg, indocin course, BMZ 1/15-16. Q6h procardia ends today *PPx: SCDs *FEN/GI: SLIV, regular diet *Dispo: see above.   Cornelia Copa MD Attending Center for [facility] (Faculty Practice) GYN Consult Phone: [phone] (M-F, [phone]) & [phone]  (Off hours, weekends, holidays)

## 2023-06-12 NOTE — Progress Notes (Signed)
Pt complaining of cramping.  Initially every 5-6 min. And now closer together.  States the pain is sharp.  Denies LOF or vaginal bleeding.  Also reporting some nausea and thinks she just did a bit too much moving around.    O: BP 115/73 (BP Location: Right Arm)   Pulse 74   Temp 98.3 F (36.8 C) (Oral)   Resp 16   Ht 5\' 6"  (1.676 m)   Wt 73.1 kg   LMP 10/31/2022 (Exact Date)   SpO2 100%   BMI 26.00 kg/m   Gen: No acute distress, resting comfortably in bed.  FHT: 130bpm, moderate variability, +accels, no decels Toco: +irritability, no contractions ntoes SVE: pt declined  A/P 27yo G1P0@ [redacted]w[redacted]d admitted for PTL  -no uterine contractions noted, pt declined exam and suspect cramping is mild -zofran for nausea to be given -procardia if needed -will continue with management as previously outlined  Myna Hidalgo, DO Attending Obstetrician & Gynecologist, Faculty Practice Center for [employer], [facility]

## 2023-06-13 NOTE — Plan of Care (Signed)
  Problem: Education: Goal: Knowledge of disease or condition will improve Outcome: Progressing Goal: Knowledge of the prescribed therapeutic regimen will improve Outcome: Progressing   Problem: Education: Goal: Knowledge of General Education information will improve Description: Including pain rating scale, medication(s)/side effects and non-pharmacologic comfort measures Outcome: Progressing   Problem: Health Behavior/Discharge Planning: Goal: Ability to manage health-related needs will improve Outcome: Progressing   Problem: Clinical Measurements: Goal: Ability to maintain clinical measurements within normal limits will improve Outcome: Progressing Goal: Will remain free from infection Outcome: Progressing Goal: Diagnostic test results will improve Outcome: Progressing   Problem: Activity: Goal: Risk for activity intolerance will decrease Outcome: Progressing   Problem: Nutrition: Goal: Adequate nutrition will be maintained Outcome: Progressing   Problem: Coping: Goal: Level of anxiety will decrease Outcome: Progressing

## 2023-06-13 NOTE — Discharge Summary (Signed)
Patient ID: Veronica Buchanan MRN: [id number] DOB/AGE: 1996-01-08 28 y.o.  Admit date: 06/10/2023 Discharge date: 06/13/2023  Admission Diagnoses:threatened preterm labor  Discharge Diagnoses: same  Prenatal Procedures: ultrasound  Consults: Neonatology  Hospital Course:  This is a 28 y.o. G1P0000 with IUP at [redacted]w[redacted]d admitted for preterm contractions. She was admitted with contractions, noted to have a cervical exam of 3 cm.  No leaking of fluid and no bleeding.  She was initially started on magnesium sulfate for tocolysis and neuroprotection and also received betamethasone x 2 doses.  Her tocolysis was transitioned to Procardia. She was seen by Neonatology during her stay.  She was observed, fetal heart rate monitoring remained reassuring, and she had no signs/symptoms of progressing preterm labor or other maternal-fetal concerns.  Her cervical exam was unchanged from admission.  She was deemed stable for discharge to home with outpatient follow up.  Discharge Exam: Temp:  [97.6 F (36.4 C)-98.3 F (36.8 C)] 97.6 F (36.4 C) (01/18 0825) Pulse Rate:  [69-85] 69 (01/18 0825) Resp:  [15-19] 15 (01/18 0825) BP: (98-116)/(53-74) 98/62 (01/18 0825) SpO2:  [98 %-100 %] 98 % (01/18 0825) Physical Examination: CONSTITUTIONAL: Well-developed, well-nourished female in no acute distress.  HENT:  Normocephalic, atraumatic, External right and left ear normal. Oropharynx is clear and moist EYES: Conjunctivae and EOM are normal. Pupils are equal, round, and reactive to light. No scleral icterus.  NECK: Normal range of motion, supple, no masses SKIN: Skin is warm and dry. No rash noted. Not diaphoretic. No erythema. No pallor. NEUROLGIC: Alert and oriented to person, place, and time. Normal reflexes, muscle tone coordination. No cranial nerve deficit noted. PSYCHIATRIC: Normal mood and affect. Normal behavior. Normal judgment and thought content. CARDIOVASCULAR: Normal heart rate noted, regular  rhythm RESPIRATORY: Effort and breath sounds normal, no problems with respiration noted MUSCULOSKELETAL: Normal range of motion. No edema and no tenderness. 2+ distal pulses. ABDOMEN: Soft, nontender, nondistended, gravid. CERVIX: Dilation: 3 Effacement (%): 70 Cervical Position: Posterior Station: -1 Presentation: Vertex Exam by:: Dr. Vergie Living Exam unchanged at discharge Fetal monitoring: FHR: 130 bpm, Variability: moderate, Accelerations: Present, Decelerations: Absent  Uterine activity: 1 contractions per hour  Significant Diagnostic Studies:  Results for orders placed or performed during the hospital encounter of 06/10/23 (from the past week)  OB RESULT CONSOLE Group B Strep   Collection Time: 06/10/23 12:00 AM  Result Value Ref Range   GBS Positive   CBC with Differential/Platelet   Collection Time: 06/10/23  1:13 PM  Result Value Ref Range   WBC 9.1 4.0 - 10.5 K/uL   RBC 5.03 3.87 - 5.11 MIL/uL   Hemoglobin 11.8 (L) 12.0 - 15.0 g/dL   HCT 78.2 95.6 - 21.3 %   MCV 75.0 (L) 80.0 - 100.0 fL   MCH 23.5 (L) 26.0 - 34.0 pg   MCHC 31.3 30.0 - 36.0 g/dL   RDW 08.6 57.8 - 46.9 %   Platelets 301 150 - 400 K/uL   nRBC 0.0 0.0 - 0.2 %   Neutrophils Relative % 74 %   Neutro Abs 6.7 1.7 - 7.7 K/uL   Lymphocytes Relative 17 %   Lymphs Abs 1.6 0.7 - 4.0 K/uL   Monocytes Relative 7 %   Monocytes Absolute 0.6 0.1 - 1.0 K/uL   Eosinophils Relative 1 %   Eosinophils Absolute 0.1 0.0 - 0.5 K/uL   Basophils Relative 0 %   Basophils Absolute 0.0 0.0 - 0.1 K/uL   Immature Granulocytes 1 %   Abs  Immature Granulocytes 0.08 (H) 0.00 - 0.07 K/uL  RPR   Collection Time: 06/10/23  1:13 PM  Result Value Ref Range   RPR Ser Ql NON REACTIVE NON REACTIVE  Comprehensive metabolic panel   Collection Time: 06/10/23  1:13 PM  Result Value Ref Range   Sodium 136 135 - 145 mmol/L   Potassium 4.1 3.5 - 5.1 mmol/L   Chloride 103 98 - 111 mmol/L   CO2 24 22 - 32 mmol/L   Glucose, Bld 79 70 - 99  mg/dL   BUN <5 (L) 6 - 20 mg/dL   Creatinine, Ser 4.54 0.44 - 1.00 mg/dL   Calcium 9.5 8.9 - 09.8 mg/dL   Total Protein 7.0 6.5 - 8.1 g/dL   Albumin 3.2 (L) 3.5 - 5.0 g/dL   AST 20 15 - 41 U/L   ALT 14 0 - 44 U/L   Alkaline Phosphatase 148 (H) 38 - 126 U/L   Total Bilirubin 0.5 0.0 - 1.2 mg/dL   GFR, Estimated >11 >91 mL/min   Anion gap 9 5 - 15  Type and screen [facility]   Collection Time: 06/10/23  1:13 PM  Result Value Ref Range   ABO/RH(D) B POS    Antibody Screen NEG    Sample Expiration      06/13/2023,2359 Performed at [facility], [address], [place], [place] [address]   GC/Chlamydia probe amp ([place])not at [facility]   Collection Time: 06/10/23  2:06 PM  Result Value Ref Range   Neisseria Gonorrhea Negative    Chlamydia Negative    Comment Normal Reference Ranger Chlamydia - Negative    Comment      Normal Reference Range Neisseria Gonorrhea - Negative  Wet prep, genital   Collection Time: 06/10/23  2:22 PM   Specimen: PATH Cytology Cervicovaginal Ancillary Only  Result Value Ref Range   Yeast Wet Prep HPF POC NONE SEEN NONE SEEN   Trich, Wet Prep NONE SEEN NONE SEEN   Clue Cells Wet Prep HPF POC NONE SEEN NONE SEEN   WBC, Wet Prep HPF POC <10 <10   Sperm NONE SEEN   Rupture of Membrane (ROM) Plus   Collection Time: 06/10/23  2:22 PM  Result Value Ref Range   Rom Plus NEGATIVE   Urinalysis, Routine w reflex microscopic -Urine, Clean Catch   Collection Time: 06/10/23  2:22 PM  Result Value Ref Range   Color, Urine YELLOW YELLOW   APPearance CLEAR CLEAR   Specific Gravity, Urine 1.013 1.005 - 1.030   pH 7.0 5.0 - 8.0   Glucose, UA NEGATIVE NEGATIVE mg/dL   Hgb urine dipstick NEGATIVE NEGATIVE   Bilirubin Urine NEGATIVE NEGATIVE   Ketones, ur NEGATIVE NEGATIVE mg/dL   Protein, ur NEGATIVE NEGATIVE mg/dL   Nitrite NEGATIVE NEGATIVE   Leukocytes,Ua NEGATIVE NEGATIVE  Culture, beta strep (group b only)   Collection Time:  06/10/23  2:28 PM   Specimen: Vaginal/Rectal; Genital  Result Value Ref Range   Specimen Description VAGINAL/RECTAL    Special Requests NONE    Culture (A)     GROUP B STREP(S.AGALACTIAE)ISOLATED TESTING AGAINST S. AGALACTIAE NOT ROUTINELY PERFORMED DUE TO PREDICTABILITY OF AMP/PEN/VAN SUSCEPTIBILITY. Performed at [facility], [address], [place], [place] [address]    Report Status 06/12/2023 FINAL   Results for orders placed or performed in visit on 06/10/23 (from the past week)  CBC   Collection Time: 06/10/23 10:00 AM  Result Value Ref  Range   WBC 8.3 3.4 - 10.8 x10E3/uL   RBC 4.85 3.77 - 5.28 x10E6/uL   Hemoglobin 11.1 11.1 - 15.9 g/dL   Hematocrit 08.6 57.8 - 46.6 %   MCV 73 (L) 79 - 97 fL   MCH 22.9 (L) 26.6 - 33.0 pg   MCHC 31.2 (L) 31.5 - 35.7 g/dL   RDW 46.9 62.9 - 52.8 %   Platelets 299 150 - 450 x10E3/uL  Comp Met (CMET)   Collection Time: 06/10/23 10:00 AM  Result Value Ref Range   Glucose 77 70 - 99 mg/dL   BUN 6 6 - 20 mg/dL   Creatinine, Ser 4.13 0.57 - 1.00 mg/dL   eGFR 244 >01 UU/VOZ/3.66   BUN/Creatinine Ratio 10 9 - 23   Sodium 137 134 - 144 mmol/L   Potassium 4.3 3.5 - 5.2 mmol/L   Chloride 101 96 - 106 mmol/L   CO2 21 20 - 29 mmol/L   Calcium 9.4 8.7 - 10.2 mg/dL   Total Protein 6.4 6.0 - 8.5 g/dL   Albumin 3.7 (L) 4.0 - 5.0 g/dL   Globulin, Total 2.7 1.5 - 4.5 g/dL   Bilirubin Total <4.4 0.0 - 1.2 mg/dL   Alkaline Phosphatase 154 (H) 44 - 121 IU/L   AST 20 0 - 40 IU/L   ALT 11 0 - 32 IU/L    Discharge Condition: Stable  Disposition: Discharge disposition: 01-Home or Self Care        Discharge Instructions     Discharge patient   Complete by: As directed    Discharge disposition: 01-Home or Self Care   Discharge patient date: 06/13/2023      Allergies as of 06/13/2023       Reactions   Latex Itching, Other (See Comments)   burning Skin with subjective "Burning Paresthesia"   Other Anaphylaxis, Swelling   NUTS -  mainly tree nut and peanuts Throat and Tongue Swelling   Peanut-containing Drug Products Swelling   Mouth swelling Mouth Swelling   Shellfish Allergy Swelling, Rash   Tape Dermatitis   Coconut Oil Hives   Justicia Adhatoda (malabar Nut Tree) [justicia Adhatoda]    Mainly pecans.        Medication List     TAKE these medications    acetaminophen 500 MG tablet Commonly known as: TYLENOL Take 500 mg by mouth every 6 (six) hours as needed.   albuterol 108 (90 Base) MCG/ACT inhaler Commonly known as: VENTOLIN HFA Inhale 2 puffs into the lungs every 6 (six) hours as needed for wheezing or shortness of breath.   calcium carbonate 500 MG chewable tablet Commonly known as: TUMS - dosed in mg elemental calcium Chew 2 tablets by mouth 3 (three) times daily with meals.   cyclobenzaprine 5 MG tablet Commonly known as: FLEXERIL Take 5-10 mg by mouth 3 (three) times daily as needed for muscle spasms.   EPINEPHrine 0.3 mg/0.3 mL Soaj injection Commonly known as: EPI-PEN   famotidine 20 MG tablet Commonly known as: PEPCID Take 20 mg by mouth 2 (two) times daily.   hydrOXYzine 25 MG tablet Commonly known as: ATARAX Take 1 tablet (25 mg total) by mouth every 6 (six) hours as needed for itching or anxiety.   multivitamin-prenatal 27-0.8 MG Tabs tablet Take 1 tablet by mouth daily at 12 noon.   omeprazole 20 MG tablet Commonly known as: PriLOSEC OTC Take 1 tablet (20 mg total) by mouth daily.   ondansetron 4 MG disintegrating tablet Commonly known  as: ZOFRAN-ODT Take 1-2 tablets (4-8 mg total) by mouth every 8 (eight) hours as needed for nausea.   polyethylene glycol powder 17 GM/SCOOP powder Commonly known as: GLYCOLAX/MIRALAX Take 1 Container by mouth once.   promethazine 25 MG tablet Commonly known as: PHENERGAN Take 1 tablet (25 mg total) by mouth every 6 (six) hours as needed for nausea or vomiting.   sertraline 25 MG tablet Commonly known as: Zoloft Take 1 tablet  (25 mg total) by mouth daily. If anxiety/depression does not worsen, increase to 2 tablets daily (50mg )   traMADol 50 MG tablet Commonly known as: ULTRAM Take 1-2 tablets (50-100 mg total) by mouth every 6 (six) hours as needed.        Follow-up Information     [facility] Follow up in 4 day(s).   Specialty: Obstetrics and Gynecology Why: she has an appointment Contact information: [address] [place] [address] [phone]                Signed: Scheryl Darter M.D. 06/13/2023, 10:46 AM

## 2023-06-24 ENCOUNTER — Ambulatory Visit (INDEPENDENT_AMBULATORY_CARE_PROVIDER_SITE_OTHER): Payer: 59 | Admitting: Family Medicine

## 2023-06-24 VITALS — BP 118/80 | HR 88 | Wt 162.6 lb

## 2023-06-24 DIAGNOSIS — O0990 Supervision of high risk pregnancy, unspecified, unspecified trimester: Secondary | ICD-10-CM

## 2023-06-24 DIAGNOSIS — O4703 False labor before 37 completed weeks of gestation, third trimester: Secondary | ICD-10-CM

## 2023-06-24 DIAGNOSIS — F419 Anxiety disorder, unspecified: Secondary | ICD-10-CM

## 2023-06-24 DIAGNOSIS — Z3A33 33 weeks gestation of pregnancy: Secondary | ICD-10-CM

## 2023-06-24 DIAGNOSIS — O479 False labor, unspecified: Secondary | ICD-10-CM

## 2023-06-24 MED ORDER — NIFEDIPINE 10 MG PO CAPS: 10.0000 mg | ORAL_CAPSULE | Freq: Three times a day (TID) | ORAL | 0 refills | Status: DC | PRN

## 2023-06-24 NOTE — Progress Notes (Signed)
   PRENATAL VISIT NOTE: Centering Pregnancy Group 15, Session 9  Subjective:  Veronica Buchanan is a 28 y.o. G1P0000 at [redacted]w[redacted]d being seen today for ongoing prenatal care.  She is currently monitored for the following issues for this low-risk pregnancy and has History of asthma; History of migraine; History of bipolar disorder; Nausea; Supervision of high risk pregnancy, antepartum; Excessive daytime sleepiness; Recurrent isolated sleep paralysis; Anxiety; Thalassemia alpha carrier; and Threatened preterm labor, third trimester on their problem list.  Patient reports no complaints.   . Vag. Bleeding: None.  Movement: Present. Denies leaking of fluid.   The following portions of the patient's history were reviewed and updated as appropriate: allergies, current medications, past family history, past medical history, past social history, past surgical history and problem list.   Objective:   Vitals:   06/24/23 0904  BP: 118/80  Pulse: 88  Weight: 162 lb 9.6 oz (73.8 kg)    Fetal Status: Fetal Heart Rate (bpm): 140 Fundal Height: 33 cm Movement: Present  Presentation: Vertex  General:  Alert, oriented and cooperative. Patient is in no acute distress.  Skin: Skin is warm and dry. No rash noted.   Cardiovascular: Normal heart rate noted  Respiratory: Normal respiratory effort, no problems with respiration noted  Abdomen: Soft, gravid, appropriate for gestational age.  Pain/Pressure: Absent     Pelvic: Cervical exam deferred        Extremities: Normal range of motion.  Edema: None  Mental Status: Normal mood and affect. Normal behavior. Normal judgment and thought content.   Assessment and Plan:  Pregnancy: G1P0000 at [redacted]w[redacted]d  1. Threatened preterm labor, third trimester (Primary) Reviewed when to take procardia Reviewed that coming to the hospital if one dose does not work Voiced understanding - NIFEdipine (PROCARDIA) 10 MG capsule; Take 1 capsule (10 mg total) by mouth 3 (three) times daily as  needed. For preterm contractions, take if > 3 contractions in 30 minutes.  Dispense: 30 capsule; Refill: 0  2. Uterine contractions - NIFEdipine (PROCARDIA) 10 MG capsule; Take 1 capsule (10 mg total) by mouth 3 (three) times daily as needed. For preterm contractions, take if > 3 contractions in 30 minutes.  Dispense: 30 capsule; Refill: 0  3. Supervision of high risk pregnancy, antepartum  Centering Pregnancy, Session#9: Reviewed resources in [employer].   Facilitated discussion today: Safe Sleep, Breastfeeding and breast changes, childcare, advocacy, normal growth and development of infants/toddlers, Care of infants  Fundal height and FHR appropriate today unless noted otherwise in plan. Patient to continue group care.  Reviewed that if she goes into labor prior to 37wks then WB will not a possibility. Voiced understanding  4. Anxiety Stable, on zoloft  5. [redacted] weeks gestation of pregnancy   Preterm labor symptoms and general obstetric precautions including but not limited to vaginal bleeding, contractions, leaking of fluid and fetal movement were reviewed in detail with the patient. Please refer to After Visit Summary for other counseling recommendations.   Return in about 2 weeks (around 07/08/2023) for Centering Pregnancy, Routine prenatal care.  Future Appointments  Date Time Provider Department Center  07/08/2023  9:00 AM CENTERING PROVIDER [facility]  07/15/2023  8:55 AM [place] Bing, MD [facility]  07/22/2023  8:55 AM [place] Bing, MD [facility]    Federico Flake, MD

## 2023-06-25 ENCOUNTER — Telehealth: Payer: Self-pay | Admitting: *Deleted

## 2023-06-25 NOTE — Telephone Encounter (Signed)
Pt left message requesting a call back about her prescription for Procardia 10 mg.  She stated that there is an issue.

## 2023-06-26 ENCOUNTER — Encounter: Payer: Self-pay | Admitting: Family Medicine

## 2023-06-29 NOTE — Telephone Encounter (Signed)
This message was addressed in telephone call on 06/29/23

## 2023-06-29 NOTE — Telephone Encounter (Signed)
Called patient regarding medication Procardia. Informed patient I spoke to her pharmacy [facility] on [address] in [place], [place] and put in a verbal order so that medication can be filled. Medication: Nifedipine 10mg , Frequency: 3 times daily PRN, ordered 30 capsule with 0 refill. Sig: Take 1 capsule (10 mg) by mouth 3 times daily as needed. For preterm contractions, take if > 3 contractions in 30 minutes 30 minutes. Pharmacy repeated verbal order.  Informed patient precautions to go to the MAU. Patient verbalized understanding.  Pharmacy stated medication will be ready in 1.5 hours. Patient stated she will go pick up medication today.  Patient also asked if she can use chemicals to do her hair. Unsure what chemicals. Advised patient to hold off using any chemical products as of now and discuss this with her provider at her next Centering appointment next week. Patient verbalized understanding.    Marcelino Duster, RN

## 2023-07-02 ENCOUNTER — Encounter: Payer: Self-pay | Admitting: *Deleted

## 2023-07-08 ENCOUNTER — Ambulatory Visit: Payer: 59 | Admitting: Family Medicine

## 2023-07-08 ENCOUNTER — Other Ambulatory Visit (HOSPITAL_COMMUNITY)
Admission: RE | Admit: 2023-07-08 | Discharge: 2023-07-08 | Disposition: A | Discharge status: Home / Self Care | Payer: 59 | Source: Ambulatory Visit | Attending: Family Medicine | Admitting: Family Medicine

## 2023-07-08 ENCOUNTER — Other Ambulatory Visit: Payer: Self-pay

## 2023-07-08 VITALS — BP 120/81 | HR 101 | Wt 170.5 lb

## 2023-07-08 DIAGNOSIS — O26893 Other specified pregnancy related conditions, third trimester: Secondary | ICD-10-CM

## 2023-07-08 DIAGNOSIS — O0990 Supervision of high risk pregnancy, unspecified, unspecified trimester: Secondary | ICD-10-CM

## 2023-07-08 DIAGNOSIS — Z8659 Personal history of other mental and behavioral disorders: Secondary | ICD-10-CM | POA: Diagnosis not present

## 2023-07-08 DIAGNOSIS — O0993 Supervision of high risk pregnancy, unspecified, third trimester: Secondary | ICD-10-CM | POA: Diagnosis not present

## 2023-07-08 DIAGNOSIS — N898 Other specified noninflammatory disorders of vagina: Secondary | ICD-10-CM | POA: Insufficient documentation

## 2023-07-08 DIAGNOSIS — O4703 False labor before 37 completed weeks of gestation, third trimester: Secondary | ICD-10-CM | POA: Insufficient documentation

## 2023-07-08 DIAGNOSIS — B951 Streptococcus, group B, as the cause of diseases classified elsewhere: Secondary | ICD-10-CM | POA: Insufficient documentation

## 2023-07-08 DIAGNOSIS — Z3A35 35 weeks gestation of pregnancy: Secondary | ICD-10-CM

## 2023-07-08 LAB — POCT FERNING: Ferning, POC: NEGATIVE

## 2023-07-08 NOTE — Progress Notes (Signed)
   PRENATAL VISIT NOTE  Subjective:  Veronica Buchanan is a 28 y.o. G1P0000 at [redacted]w[redacted]d being seen today for ongoing prenatal care.  She is currently monitored for the following issues for this high-risk pregnancy and has History of asthma; History of migraine; History of bipolar disorder; Nausea; Supervision of high risk pregnancy, antepartum; Excessive daytime sleepiness; Recurrent isolated sleep paralysis; Anxiety; Thalassemia alpha carrier; Threatened preterm labor, third trimester; and Positive GBS test on their problem list.  Patient reports no complaints.  Contractions: Irritability. Vag. Bleeding: None.  Movement: Present. Denies leaking of fluid.   The following portions of the patient's history were reviewed and updated as appropriate: allergies, current medications, past family history, past medical history, past social history, past surgical history and problem list.   Objective:   Vitals:   07/08/23 1006  BP: 120/81  Pulse: (!) 101  Weight: 170 lb 8 oz (77.3 kg)    Fetal Status: Fetal Heart Rate (bpm): 147   Movement: Present     General:  Alert, oriented and cooperative. Patient is in no acute distress.  Skin: Skin is warm and dry. No rash noted.   Cardiovascular: Normal heart rate noted  Respiratory: Normal respiratory effort, no problems with respiration noted  Abdomen: Soft, gravid, appropriate for gestational age.  Pain/Pressure: Present     Pelvic: Cervical exam performed in the presence of a chaperone       Cervix is visually 3. White, thick discharge. No pooling  Extremities: Normal range of motion.  Edema: Trace  Mental Status: Normal mood and affect. Normal behavior. Normal judgment and thought content.   Assessment and Plan:  Pregnancy: G1P0000 at [redacted]w[redacted]d 1. Threatened preterm labor, third trimester (Primary) Not taking procardia Was admitted 1/15 -1/18 BMZ on 1/15 and 1/16 Last cervical exam was 3/70/-1  2. Supervision of high risk pregnancy, antepartum Up to  date Was GBS positive on 1/15 will need repeat swab next week if still pregnant.  Vigorous movement Continues to desire WB if she reaches term  Reports leaking fluid for 1 week and need ton change underwear frequently- FERN negative today Counseled to go to MAU for continued leaking Results for orders placed or performed in visit on 07/08/23 (from the past 24 hours)  POCT [place]     Status: Normal   Collection Time: 07/08/23 10:52 AM  Result Value Ref Range   [place], POC Negative Negative    3. History of bipolar disorder Stable, slightly blunted affect  Preterm labor symptoms and general obstetric precautions including but not limited to vaginal bleeding, contractions, leaking of fluid and fetal movement were reviewed in detail with the patient. Please refer to After Visit Summary for other counseling recommendations.   Return in about 1 week (around 07/15/2023) for Routine prenatal care.  Future Appointments  Date Time Provider Department Center  07/15/2023  8:55 AM [place] Bing, MD [facility]  07/22/2023  8:55 AM [place] Bing, MD [facility]    Federico Flake, MD

## 2023-07-09 LAB — CERVICOVAGINAL ANCILLARY ONLY
Bacterial Vaginitis (gardnerella): NEGATIVE
Candida Glabrata: NEGATIVE
Candida Vaginitis: NEGATIVE
Chlamydia: NEGATIVE
Comment: NEGATIVE
Comment: NEGATIVE
Comment: NEGATIVE
Comment: NEGATIVE
Comment: NEGATIVE
Comment: NORMAL
Neisseria Gonorrhea: NEGATIVE
Trichomonas: NEGATIVE

## 2023-07-11 ENCOUNTER — Encounter: Payer: Self-pay | Admitting: Family Medicine

## 2023-07-15 ENCOUNTER — Other Ambulatory Visit: Payer: Self-pay

## 2023-07-15 ENCOUNTER — Ambulatory Visit: Payer: 59 | Admitting: Obstetrics and Gynecology

## 2023-07-15 VITALS — BP 121/80 | HR 75 | Wt 173.0 lb

## 2023-07-15 DIAGNOSIS — B951 Streptococcus, group B, as the cause of diseases classified elsewhere: Secondary | ICD-10-CM

## 2023-07-15 DIAGNOSIS — O0990 Supervision of high risk pregnancy, unspecified, unspecified trimester: Secondary | ICD-10-CM

## 2023-07-15 DIAGNOSIS — Z8751 Personal history of pre-term labor: Secondary | ICD-10-CM | POA: Insufficient documentation

## 2023-07-15 DIAGNOSIS — O09213 Supervision of pregnancy with history of pre-term labor, third trimester: Secondary | ICD-10-CM | POA: Diagnosis not present

## 2023-07-15 DIAGNOSIS — O0993 Supervision of high risk pregnancy, unspecified, third trimester: Secondary | ICD-10-CM | POA: Diagnosis not present

## 2023-07-15 DIAGNOSIS — Z3A36 36 weeks gestation of pregnancy: Secondary | ICD-10-CM

## 2023-07-15 NOTE — Progress Notes (Signed)
   PRENATAL VISIT NOTE  Subjective:  Veronica Buchanan is a 28 y.o. G1P0000 at [redacted]w[redacted]d being seen today for ongoing prenatal care.  She is currently monitored for the following issues for this high-risk pregnancy and has History of asthma; History of migraine; History of bipolar disorder; Nausea; Supervision of high risk pregnancy, antepartum; Excessive daytime sleepiness; Recurrent isolated sleep paralysis; Anxiety; Thalassemia alpha carrier; Threatened preterm labor, third trimester; and Positive GBS test on their problem list.  Patient reports no complaints.  Contractions: Irritability. Vag. Bleeding: None.  Movement: Present. Denies leaking of fluid.   The following portions of the patient's history were reviewed and updated as appropriate: allergies, current medications, past family history, past medical history, past social history, past surgical history and problem list.   Objective:   Vitals:   07/15/23 0910  BP: 121/80  Pulse: 75  Weight: 173 lb (78.5 kg)    Fetal Status: Fetal Heart Rate (bpm): 128 Fundal Height: 35 cm Movement: Present  Presentation: Vertex  General:  Alert, oriented and cooperative. Patient is in no acute distress.  Skin: Skin is warm and dry. No rash noted.   Cardiovascular: Normal heart rate noted  Respiratory: Normal respiratory effort, no problems with respiration noted  Abdomen: Soft, gravid, appropriate for gestational age.  Pain/Pressure: Absent     Pelvic: Cervical exam deferred        Extremities: Normal range of motion.  Edema: Trace  Mental Status: Normal mood and affect. Normal behavior. Normal judgment and thought content.   Assessment and Plan:  Pregnancy: G1P0000 at [redacted]w[redacted]d 1. [redacted] weeks gestation of pregnancy (Primary) Routine care. F/u re: birth control next visit. GC/CT swab neg last week 1/16: efw 75%, 2058g, ac 89% ,afi 13. Repeat prn  2. Positive GBS test  3. Supervision of high risk pregnancy, antepartum  Preterm labor symptoms and  general obstetric precautions including but not limited to vaginal bleeding, contractions, leaking of fluid and fetal movement were reviewed in detail with the patient. Please refer to After Visit Summary for other counseling recommendations.   Return in about 1 week (around 07/22/2023) for low risk ob, in person, md or app.  Future Appointments  Date Time Provider Department Center  07/22/2023  8:55 AM [place] Bing, MD [facility]     Bing, MD

## 2023-07-15 NOTE — Progress Notes (Signed)
3.6 °

## 2023-07-18 ENCOUNTER — Inpatient Hospital Stay (HOSPITAL_COMMUNITY)
Admission: AD | Admit: 2023-07-18 | Discharge: 2023-07-20 | DRG: 807 | Disposition: A | Discharge status: Home / Self Care | Payer: 59 | Attending: Maternal & Fetal Medicine | Admitting: Maternal & Fetal Medicine

## 2023-07-18 ENCOUNTER — Other Ambulatory Visit: Payer: Self-pay

## 2023-07-18 ENCOUNTER — Encounter (HOSPITAL_COMMUNITY): Payer: Self-pay | Admitting: Maternal & Fetal Medicine

## 2023-07-18 DIAGNOSIS — O99824 Streptococcus B carrier state complicating childbirth: Secondary | ICD-10-CM | POA: Diagnosis present

## 2023-07-18 DIAGNOSIS — O99344 Other mental disorders complicating childbirth: Secondary | ICD-10-CM | POA: Diagnosis present

## 2023-07-18 DIAGNOSIS — B951 Streptococcus, group B, as the cause of diseases classified elsewhere: Secondary | ICD-10-CM

## 2023-07-18 DIAGNOSIS — Z3A37 37 weeks gestation of pregnancy: Secondary | ICD-10-CM

## 2023-07-18 DIAGNOSIS — O9962 Diseases of the digestive system complicating childbirth: Secondary | ICD-10-CM | POA: Diagnosis present

## 2023-07-18 DIAGNOSIS — F319 Bipolar disorder, unspecified: Secondary | ICD-10-CM | POA: Diagnosis present

## 2023-07-18 DIAGNOSIS — K219 Gastro-esophageal reflux disease without esophagitis: Secondary | ICD-10-CM | POA: Diagnosis present

## 2023-07-18 DIAGNOSIS — O4693 Antepartum hemorrhage, unspecified, third trimester: Secondary | ICD-10-CM

## 2023-07-18 DIAGNOSIS — O368130 Decreased fetal movements, third trimester, not applicable or unspecified: Principal | ICD-10-CM | POA: Diagnosis present

## 2023-07-18 DIAGNOSIS — F419 Anxiety disorder, unspecified: Secondary | ICD-10-CM | POA: Diagnosis present

## 2023-07-18 DIAGNOSIS — O9952 Diseases of the respiratory system complicating childbirth: Secondary | ICD-10-CM | POA: Diagnosis present

## 2023-07-18 DIAGNOSIS — Z91040 Latex allergy status: Secondary | ICD-10-CM

## 2023-07-18 DIAGNOSIS — Z79899 Other long term (current) drug therapy: Secondary | ICD-10-CM

## 2023-07-18 DIAGNOSIS — O0990 Supervision of high risk pregnancy, unspecified, unspecified trimester: Secondary | ICD-10-CM

## 2023-07-18 DIAGNOSIS — J45909 Unspecified asthma, uncomplicated: Secondary | ICD-10-CM | POA: Diagnosis present

## 2023-07-18 HISTORY — DX: Depression, unspecified: F32.A

## 2023-07-18 LAB — CBC WITH DIFFERENTIAL/PLATELET
Abs Immature Granulocytes: 0.08 10*3/uL — ABNORMAL HIGH (ref 0.00–0.07)
Basophils Absolute: 0 10*3/uL (ref 0.0–0.1)
Basophils Relative: 0 %
Eosinophils Absolute: 0 10*3/uL (ref 0.0–0.5)
Eosinophils Relative: 0 %
HCT: 36 % (ref 36.0–46.0)
Hemoglobin: 11.3 g/dL — ABNORMAL LOW (ref 12.0–15.0)
Immature Granulocytes: 1 %
Lymphocytes Relative: 11 %
Lymphs Abs: 1.5 10*3/uL (ref 0.7–4.0)
MCH: 23.3 pg — ABNORMAL LOW (ref 26.0–34.0)
MCHC: 31.4 g/dL (ref 30.0–36.0)
MCV: 74.1 fL — ABNORMAL LOW (ref 80.0–100.0)
Monocytes Absolute: 0.8 10*3/uL (ref 0.1–1.0)
Monocytes Relative: 5 %
Neutro Abs: 11.4 10*3/uL — ABNORMAL HIGH (ref 1.7–7.7)
Neutrophils Relative %: 83 %
Platelets: 332 10*3/uL (ref 150–400)
RBC: 4.86 MIL/uL (ref 3.87–5.11)
RDW: 13.8 % (ref 11.5–15.5)
WBC: 13.8 10*3/uL — ABNORMAL HIGH (ref 4.0–10.5)
nRBC: 0 % (ref 0.0–0.2)

## 2023-07-18 LAB — TYPE AND SCREEN
ABO/RH(D): B POS
Antibody Screen: NEGATIVE

## 2023-07-18 LAB — DIC (DISSEMINATED INTRAVASCULAR COAGULATION)PANEL
D-Dimer, Quant: 2.64 ug{FEU}/mL — ABNORMAL HIGH (ref 0.00–0.50)
Fibrinogen: 546 mg/dL — ABNORMAL HIGH (ref 210–475)
INR: 1 (ref 0.8–1.2)
Platelets: 344 10*3/uL (ref 150–400)
Prothrombin Time: 13.8 s (ref 11.4–15.2)
Smear Review: NONE SEEN
aPTT: 31 s (ref 24–36)

## 2023-07-18 LAB — HIV ANTIBODY (ROUTINE TESTING W REFLEX): HIV Screen 4th Generation wRfx: NONREACTIVE

## 2023-07-18 MED ORDER — ALBUTEROL SULFATE (2.5 MG/3ML) 0.083% IN NEBU: 2.5000 mg | INHALATION_SOLUTION | Freq: Four times a day (QID) | RESPIRATORY_TRACT | Status: DC | PRN

## 2023-07-18 MED ORDER — SODIUM CHLORIDE 0.9 % IV SOLN
5.0000 10*6.[IU] | Freq: Once | INTRAVENOUS | Status: AC
  Administered 2023-07-18: 5 10*6.[IU] via INTRAVENOUS
  Filled 2023-07-18: qty 5

## 2023-07-18 MED ORDER — SERTRALINE HCL 25 MG PO TABS
25.0000 mg | ORAL_TABLET | Freq: Every day | ORAL | Status: DC
  Administered 2023-07-19: 25 mg via ORAL
  Filled 2023-07-18: qty 1

## 2023-07-18 MED ORDER — ALBUTEROL SULFATE HFA 108 (90 BASE) MCG/ACT IN AERS: 1.0000 | INHALATION_SPRAY | Freq: Four times a day (QID) | RESPIRATORY_TRACT | Status: DC | PRN

## 2023-07-18 MED ORDER — FENTANYL CITRATE (PF) 100 MCG/2ML IJ SOLN
50.0000 ug | Freq: Once | INTRAMUSCULAR | Status: AC
  Administered 2023-07-18: 50 ug via INTRAVENOUS
  Filled 2023-07-18: qty 2

## 2023-07-18 MED ORDER — HYDROXYZINE HCL 25 MG PO TABS: 25.0000 mg | ORAL_TABLET | Freq: Four times a day (QID) | ORAL | Status: DC | PRN

## 2023-07-18 MED ORDER — ONDANSETRON HCL 4 MG/2ML IJ SOLN
4.0000 mg | Freq: Four times a day (QID) | INTRAMUSCULAR | Status: DC | PRN
  Administered 2023-07-18: 4 mg via INTRAVENOUS
  Filled 2023-07-18: qty 2

## 2023-07-18 MED ORDER — OXYCODONE-ACETAMINOPHEN 5-325 MG PO TABS
2.0000 | ORAL_TABLET | ORAL | Status: DC | PRN
  Administered 2023-07-18: 2 via ORAL
  Filled 2023-07-18: qty 2

## 2023-07-18 MED ORDER — LACTATED RINGERS IV SOLN
INTRAVENOUS | Status: DC
  Administered 2023-07-18: 1000 mL via INTRAVENOUS

## 2023-07-18 MED ORDER — PENICILLIN G POT IN DEXTROSE 60000 UNIT/ML IV SOLN
3.0000 10*6.[IU] | INTRAVENOUS | Status: DC
  Administered 2023-07-19 (×2): 3 10*6.[IU] via INTRAVENOUS
  Filled 2023-07-18 (×4): qty 50

## 2023-07-18 MED ORDER — FAMOTIDINE IN NACL 20-0.9 MG/50ML-% IV SOLN: 20.0000 mg | Freq: Two times a day (BID) | INTRAVENOUS | Status: DC | PRN
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
## 2023-07-18 NOTE — Progress Notes (Signed)
 Veronica Buchanan MRN: [id number]  Subjective: -Care assumed of 28 y.o. G1P0 at [redacted]w[redacted]d who presents for active. In room to meet acquaintance of patient.  Patient currently with doula at bedside.  States she feel she is coping well, but contractions are overwhelming. Currently laying in bed, but then UTB with c/o urge to push.   Objective: BP 126/83   Pulse 77   Temp 98.4 F (36.9 C) (Oral)   Resp 17   Wt 77.3 kg   LMP 10/31/2022 (Exact Date)   SpO2 (!) 86%   BMI 27.52 kg/m  No intake/output data recorded. No intake/output data recorded.  Fetal Monitoring: FHT: 145 bpm, Mod Var, -Decels, -Accels UC: Palpate mild to moderate    Physical Exam: General appearance: Alert, Awake, and in mild distress s/t contractions.  Chest: Normal Rate. No respiratory distress.  Abdominal exam: Gravid, Soft RT, Nontender. Extremities: Min edema Skin exam: Warm Dry  Vaginal Exam: SVE:   Dilation: 5 Effacement (%): 80, 90 Station: -2 Exam by:: J.Quanta Roher, CNM Membranes:Intact Internal Monitors: None  Augmentation/Induction: Pitocin:None Cytotec: None  Assessment:  IUP at 37.1 weeks Cat I FT  Active Labor GBS Positive  Plan: -Initial dose PCN complete -Cervical exam as above. -Discussed labor plans and informed that WB restrictions in setting of vaginal bleeding and midwife availability. -Patient verbalizes understanding and expresses desire for healthy birth overall! -Informed that she will be sent to labor when room becomes available. -Okay for intermittent monitoring as appropriate and ambulation as tolerated.  -Reassessment prn.   Valma Cava, CNM 07/18/2023, 9:41 PM
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
## 2023-07-18 NOTE — MAU Note (Signed)
.  Veronica Buchanan is a 28 y.o. at [redacted]w[redacted]d here in MAU reporting: Patient reports ctx 5-7 that started 1 hour ago. Patient reports about 40 minutes ago she started seeing vaginal bleeding. Patient report decreased fetal movement today.  Onset of complaint:  Pain score: 8/10 There were no vitals filed for this visit.    Lab orders placed from triage:   none

## 2023-07-19 ENCOUNTER — Inpatient Hospital Stay (HOSPITAL_COMMUNITY): Payer: 59 | Admitting: Anesthesiology

## 2023-07-19 ENCOUNTER — Encounter (HOSPITAL_COMMUNITY): Payer: Self-pay | Admitting: Maternal & Fetal Medicine

## 2023-07-19 DIAGNOSIS — F419 Anxiety disorder, unspecified: Secondary | ICD-10-CM | POA: Diagnosis present

## 2023-07-19 DIAGNOSIS — O99344 Other mental disorders complicating childbirth: Secondary | ICD-10-CM | POA: Diagnosis present

## 2023-07-19 DIAGNOSIS — O368130 Decreased fetal movements, third trimester, not applicable or unspecified: Secondary | ICD-10-CM | POA: Diagnosis present

## 2023-07-19 DIAGNOSIS — Z79899 Other long term (current) drug therapy: Secondary | ICD-10-CM | POA: Diagnosis not present

## 2023-07-19 DIAGNOSIS — O9962 Diseases of the digestive system complicating childbirth: Secondary | ICD-10-CM | POA: Diagnosis present

## 2023-07-19 DIAGNOSIS — O4693 Antepartum hemorrhage, unspecified, third trimester: Secondary | ICD-10-CM | POA: Diagnosis not present

## 2023-07-19 DIAGNOSIS — J45909 Unspecified asthma, uncomplicated: Secondary | ICD-10-CM | POA: Diagnosis present

## 2023-07-19 DIAGNOSIS — Z91040 Latex allergy status: Secondary | ICD-10-CM | POA: Diagnosis not present

## 2023-07-19 DIAGNOSIS — K219 Gastro-esophageal reflux disease without esophagitis: Secondary | ICD-10-CM | POA: Diagnosis present

## 2023-07-19 DIAGNOSIS — F319 Bipolar disorder, unspecified: Secondary | ICD-10-CM | POA: Diagnosis present

## 2023-07-19 DIAGNOSIS — Z3A37 37 weeks gestation of pregnancy: Secondary | ICD-10-CM | POA: Diagnosis not present

## 2023-07-19 DIAGNOSIS — O9952 Diseases of the respiratory system complicating childbirth: Secondary | ICD-10-CM | POA: Diagnosis present

## 2023-07-19 DIAGNOSIS — O99820 Streptococcus B carrier state complicating pregnancy: Secondary | ICD-10-CM | POA: Diagnosis not present

## 2023-07-19 DIAGNOSIS — O99824 Streptococcus B carrier state complicating childbirth: Secondary | ICD-10-CM | POA: Diagnosis present

## 2023-07-19 LAB — RPR: RPR Ser Ql: NONREACTIVE

## 2023-07-19 MED ORDER — PHENYLEPHRINE 80 MCG/ML (10ML) SYRINGE FOR IV PUSH (FOR BLOOD PRESSURE SUPPORT): 80.0000 ug | PREFILLED_SYRINGE | INTRAVENOUS | Status: DC | PRN

## 2023-07-19 MED ORDER — EPHEDRINE 5 MG/ML INJ: 10.0000 mg | INTRAVENOUS | Status: DC | PRN

## 2023-07-19 MED ORDER — ACETAMINOPHEN 325 MG PO TABS
650.0000 mg | ORAL_TABLET | ORAL | Status: DC | PRN
  Filled 2023-07-19: qty 2

## 2023-07-19 MED ORDER — FENTANYL CITRATE (PF) 100 MCG/2ML IJ SOLN
50.0000 ug | INTRAMUSCULAR | Status: DC | PRN
  Administered 2023-07-19: 50 ug via INTRAVENOUS
  Administered 2023-07-19: 100 ug via INTRAVENOUS
  Filled 2023-07-19 (×2): qty 2

## 2023-07-19 MED ORDER — ZOLPIDEM TARTRATE 5 MG PO TABS: 5.0000 mg | ORAL_TABLET | Freq: Every evening | ORAL | Status: DC | PRN

## 2023-07-19 MED ORDER — DIPHENHYDRAMINE HCL 50 MG/ML IJ SOLN
12.5000 mg | INTRAMUSCULAR | Status: DC | PRN
  Administered 2023-07-19: 12.5 mg via INTRAVENOUS
  Filled 2023-07-19: qty 1

## 2023-07-19 MED ORDER — OXYTOCIN BOLUS FROM INFUSION
333.0000 mL | Freq: Once | INTRAVENOUS | Status: AC
  Administered 2023-07-19: 333 mL via INTRAVENOUS

## 2023-07-19 MED ORDER — FENTANYL-BUPIVACAINE-NACL 0.5-0.125-0.9 MG/250ML-% EP SOLN
12.0000 mL/h | EPIDURAL | Status: DC | PRN
  Administered 2023-07-19: 12 mL/h via EPIDURAL
  Filled 2023-07-19: qty 250

## 2023-07-19 MED ORDER — ONDANSETRON HCL 4 MG/2ML IJ SOLN: 4.0000 mg | INTRAMUSCULAR | Status: DC | PRN

## 2023-07-19 MED ORDER — SODIUM CHLORIDE 0.9 % IV SOLN: 250.0000 mL | INTRAVENOUS | Status: DC | PRN

## 2023-07-19 MED ORDER — SODIUM CHLORIDE 0.9% FLUSH: 3.0000 mL | Freq: Two times a day (BID) | INTRAVENOUS | Status: DC

## 2023-07-19 MED ORDER — LIDOCAINE HCL (PF) 1 % IJ SOLN: 30.0000 mL | INTRAMUSCULAR | Status: DC | PRN

## 2023-07-19 MED ORDER — LIDOCAINE HCL (PF) 1 % IJ SOLN
INTRAMUSCULAR | Status: DC | PRN
Start: 2023-07-19 — End: 2023-07-19
  Administered 2023-07-19: 5 mL via EPIDURAL
  Administered 2023-07-19: 4 mL via EPIDURAL

## 2023-07-19 MED ORDER — IBUPROFEN 600 MG PO TABS
600.0000 mg | ORAL_TABLET | Freq: Four times a day (QID) | ORAL | Status: DC
  Filled 2023-07-19 (×2): qty 1

## 2023-07-19 MED ORDER — LACTATED RINGERS IV SOLN: 500.0000 mL | Freq: Once | INTRAVENOUS | Status: DC

## 2023-07-19 MED ORDER — PRENATAL MULTIVITAMIN CH
1.0000 | ORAL_TABLET | Freq: Every day | ORAL | Status: DC
  Administered 2023-07-20: 1 via ORAL
  Filled 2023-07-19: qty 1

## 2023-07-19 MED ORDER — DIPHENHYDRAMINE HCL 25 MG PO CAPS: 25.0000 mg | ORAL_CAPSULE | Freq: Four times a day (QID) | ORAL | Status: DC | PRN

## 2023-07-19 MED ORDER — BENZOCAINE-MENTHOL 20-0.5 % EX AERO
1.0000 | INHALATION_SPRAY | CUTANEOUS | Status: DC | PRN
  Administered 2023-07-19: 1 via TOPICAL
  Filled 2023-07-19: qty 56

## 2023-07-19 MED ORDER — DIBUCAINE (PERIANAL) 1 % EX OINT: 1.0000 | TOPICAL_OINTMENT | CUTANEOUS | Status: DC | PRN

## 2023-07-19 MED ORDER — ONDANSETRON HCL 4 MG PO TABS: 4.0000 mg | ORAL_TABLET | ORAL | Status: DC | PRN

## 2023-07-19 MED ORDER — WITCH HAZEL-GLYCERIN EX PADS: 1.0000 | MEDICATED_PAD | CUTANEOUS | Status: DC | PRN

## 2023-07-19 MED ORDER — OXYCODONE HCL 5 MG PO TABS: 5.0000 mg | ORAL_TABLET | ORAL | Status: DC | PRN

## 2023-07-19 MED ORDER — SENNOSIDES-DOCUSATE SODIUM 8.6-50 MG PO TABS
2.0000 | ORAL_TABLET | ORAL | Status: DC
  Administered 2023-07-19: 2 via ORAL
  Filled 2023-07-19: qty 2

## 2023-07-19 MED ORDER — METHYLERGONOVINE MALEATE 0.2 MG/ML IJ SOLN
INTRAMUSCULAR | Status: AC
  Filled 2023-07-19: qty 1

## 2023-07-19 MED ORDER — SODIUM CHLORIDE 0.9% FLUSH: 3.0000 mL | INTRAVENOUS | Status: DC | PRN

## 2023-07-19 MED ORDER — OXYTOCIN-SODIUM CHLORIDE 30-0.9 UT/500ML-% IV SOLN
2.5000 [IU]/h | INTRAVENOUS | Status: DC
  Filled 2023-07-19: qty 500

## 2023-07-19 MED ORDER — COCONUT OIL OIL: 1.0000 | TOPICAL_OIL | Status: DC | PRN

## 2023-07-19 MED ORDER — LACTATED RINGERS IV SOLN: INTRAVENOUS | Status: DC

## 2023-07-19 MED ORDER — LACTATED RINGERS IV SOLN
500.0000 mL | INTRAVENOUS | Status: DC | PRN
  Administered 2023-07-19: 1000 mL via INTRAVENOUS

## 2023-07-19 MED ORDER — OXYCODONE-ACETAMINOPHEN 5-325 MG PO TABS: 1.0000 | ORAL_TABLET | ORAL | Status: DC | PRN

## 2023-07-19 MED ORDER — SOD CITRATE-CITRIC ACID 500-334 MG/5ML PO SOLN: 30.0000 mL | ORAL | Status: DC | PRN

## 2023-07-19 MED ORDER — ACETAMINOPHEN 325 MG PO TABS
650.0000 mg | ORAL_TABLET | ORAL | Status: DC | PRN
  Administered 2023-07-19 – 2023-07-20 (×4): 650 mg via ORAL
  Filled 2023-07-19 (×4): qty 2

## 2023-07-19 MED ORDER — METHYLERGONOVINE MALEATE 0.2 MG/ML IJ SOLN
0.2000 mg | INTRAMUSCULAR | Status: AC
  Administered 2023-07-19: 0.2 mg via INTRAMUSCULAR

## 2023-07-19 MED ORDER — SIMETHICONE 80 MG PO CHEW: 80.0000 mg | CHEWABLE_TABLET | ORAL | Status: DC | PRN

## 2023-07-19 MED ORDER — TETANUS-DIPHTH-ACELL PERTUSSIS 5-2.5-18.5 LF-MCG/0.5 IM SUSY: 0.5000 mL | PREFILLED_SYRINGE | Freq: Once | INTRAMUSCULAR | Status: DC

## 2023-07-19 NOTE — Lactation Note (Signed)
 This note was copied from a baby's chart. Lactation Consultation Note  Patient Name: Veronica Buchanan [id number] Date: 07/19/2023 Age:28 hours Reason for consult: Mother's request;Early term 37-38.6wks;Initial assessment;1st time breastfeeding.  P1, ETI female infant, MOB latched infant on her right breast with pillow support using the cross cradle hold  position, infant sustained latch and was still BF after 7 minutes when LC left the room. MOB knows to continue to BF infant by cues, on demand, every 2-3 hours, skin to skin. MOB knows to call for further latch assistance if needed. LC discussed importance of maternal rest, balance meals and snacks, hydration. MOB was  made aware of O/P services, breastfeeding support groups, community resources, and our phone # for post-discharge questions.    Maternal Data Has patient been taught Hand Expression?: No Does the patient have breastfeeding experience prior to this delivery?: No  Feeding Mother's Current Feeding Choice: Breast Milk  LATCH Score Latch: Grasps breast easily, tongue down, lips flanged, rhythmical sucking.  Audible Swallowing: A few with stimulation  Type of Nipple: Everted at rest and after stimulation  Comfort (Breast/Nipple): Soft / non-tender  Hold (Positioning): Assistance needed to correctly position infant at breast and maintain latch.  LATCH Score: 8   Lactation Tools Discussed/Used    Interventions Interventions: Breast feeding basics reviewed;Assisted with latch;Skin to skin;Breast compression;Adjust position;Support pillows;Position options;Education;LC Services brochure  Discharge Pump: DEBP;Personal  Consult Status Consult Status: Follow-up Date: 07/20/23 Follow-up type: In-patient    Frederico Hamman 07/19/2023, 6:29 PM

## 2023-07-19 NOTE — Anesthesia Preprocedure Evaluation (Signed)
 Anesthesia Evaluation  Patient identified by MRN, date of birth, ID band Patient awake    Reviewed: Allergy & Precautions, NPO status , Patient's Chart, lab work & pertinent test results  History of Anesthesia Complications Negative for: history of anesthetic complications  Airway Mallampati: II   Neck ROM: Full    Dental   Pulmonary asthma    Pulmonary exam normal        Cardiovascular negative cardio ROS Normal cardiovascular exam     Neuro/Psych  Headaches, Seizures -,  PSYCHIATRIC DISORDERS Anxiety Depression Bipolar Disorder    Tinnitus Recurrent isolated sleep paralysis     GI/Hepatic Neg liver ROS,GERD  Medicated,,  Endo/Other  negative endocrine ROS    Renal/GU negative Renal ROS     Musculoskeletal negative musculoskeletal ROS (+)    Abdominal   Peds  Hematology  Thalassemia alpha carrier Plt 344k    Anesthesia Other Findings   Reproductive/Obstetrics (+) Pregnancy                              Anesthesia Physical Anesthesia Plan  ASA: 3  Anesthesia Plan: Epidural   Post-op Pain Management: Minimal or no pain anticipated   Induction:   PONV Risk Score and Plan: 2 and Treatment may vary due to age or medical condition  Airway Management Planned: Natural Airway  Additional Equipment: None  Intra-op Plan:   Post-operative Plan:   Informed Consent: I have reviewed the patients History and Physical, chart, labs and discussed the procedure including the risks, benefits and alternatives for the proposed anesthesia with the patient or authorized representative who has indicated his/her understanding and acceptance.       Plan Discussed with: Anesthesiologist  Anesthesia Plan Comments: (Labs reviewed. Platelets acceptable, patient not taking any blood thinning medications. Per RN, FHR tracing reported to be stable enough for sitting procedure. Risks and benefits  discussed with patient, including PDPH, backache, epidural hematoma, failed epidural, blood pressure changes, allergic reaction, and nerve injury. Patient expressed understanding and wished to proceed.)         Anesthesia Quick Evaluation

## 2023-07-19 NOTE — Progress Notes (Signed)
 Labor Progress Note  Veronica Buchanan is a 28 y.o. G1P0000 at [redacted]w[redacted]d presented for SOL c/b VB at term  S: pt very uncomfortable and called to room given reports of needing to push after being called 9.5/90/0 by nursing.   O:  BP (!) 89/59   Pulse 86   Temp 98.6 F (37 C) (Axillary)   Resp 18   Wt 77.3 kg   LMP 10/31/2022 (Exact Date)   SpO2 100%   BMI 27.52 kg/m  EFM:140bpm/Moderate variability/ 15x15 accels/ None decels CAT: 1 Toco: regular, every 3-5 minutes   CVE: Dilation: 8.5 Effacement (%): 90 Cervical Position: Anterior Station: -1 Presentation: Vertex Exam by:: Dr Judd Lien   A&P: 28 y.o. G1P0000 [redacted]w[redacted]d  here for SOL as above  #Labor: Progressing well despite being initially called 9.5/90/0.  Pt 8-8.5/90/-1 on my exam.  Able to get epidural and awaiting patient to get comfortable with plans to check again shortly.  #Pain: Epidural #FWB: CAT 1 #GBS positive, PCN   #Hx of asthma: Albuterol PRN   #Hx of Anxiety, Bipolar: continue home Zoloft every day    #Hx of cervical leep procedure  Hessie Dibble, MD FMOB Fellow, Faculty practice [facility] 07/19/23  8:34 AM

## 2023-07-19 NOTE — Progress Notes (Signed)
 Labor Progress Note  Veronica Buchanan is a 28 y.o. G1P0000 at [redacted]w[redacted]d presented for SOL c/b VB at term  S: pt comfortable in bed, agreeable to AROM check, minimal smear of blood on pad worn for hours   O:  BP 116/78   Pulse 89   Temp 98.4 F (36.9 C) (Axillary)   Resp 16   Wt 77.3 kg   LMP 10/31/2022 (Exact Date)   SpO2 100%   BMI 27.52 kg/m  EFM:130bpm/Moderate variability/ 15x15 accels/ None decels CAT: 1 Toco: toco not picking up but pt feeling them q54mins    CVE: Dilation: 5 Effacement (%): 80 Cervical Position: Anterior Station: -2 Presentation: Vertex Exam by:: Dr Judd Lien   A&P: 28 y.o. G1P0000 [redacted]w[redacted]d  here for SOL c/b VB at term as above  #Labor: AROM cf, mom and baby tolerated well.  Will plan to start Pit if CTX space or if unchanged at 4 hours  #Pain: per patient request #FWB: CAT 1 #GBS positive, PCN   #Hx of asthma: Albuterol PRN  #Hx of Anxiety, Bipolar: continue home Zoloft every day   #Hx of cervical leep procedure  Veronica Dibble, MD FMOB Fellow, Faculty practice [facility], Center for [facility] 07/19/23  3:48 AM

## 2023-07-19 NOTE — Anesthesia Procedure Notes (Signed)
 Epidural Patient location during procedure: OB Start time: 07/19/2023 7:04 AM End time: 07/19/2023 7:04 AM  Staffing Anesthesiologist: Beryle Lathe, MD Performed: anesthesiologist   Preanesthetic Checklist Completed: patient identified, IV checked, risks and benefits discussed, monitors and equipment checked, pre-op evaluation and timeout performed  Epidural Patient position: sitting Prep: DuraPrep Patient monitoring: continuous pulse ox and blood pressure Approach: midline Location: L3-L4 Injection technique: LOR saline  Needle:  Needle type: Tuohy  Needle gauge: 17 G Needle length: 9 cm Needle insertion depth: 4.5 cm Catheter size: 19 Gauge Catheter at skin depth: 9 cm Test dose: negative and Other (1% lidocaine)  Assessment Events: blood not aspirated and no cerebrospinal fluid  Additional Notes Patient identified. Risks including, but not limited to, bleeding, infection, nerve damage, paralysis, inadequate analgesia, blood pressure changes, nausea, vomiting, allergic reaction, postpartum back pain, itching, and headache were discussed. Patient expressed understanding and wished to proceed. Sterile prep and drape, including hand hygiene, mask, and sterile gloves were used. The patient was positioned and the spine was prepped. The skin was anesthetized with lidocaine. No paraesthesia or other complication noted. The patient did not experience any signs of intravascular injection such as tinnitus or metallic taste in mouth, nor signs of intrathecal spread such as rapid motor block. Please see nursing notes for vital signs. The patient tolerated the procedure well.   Leslye Peer, MDReason for block:procedure for pain

## 2023-07-19 NOTE — Lactation Note (Signed)
 This note was copied from a baby's chart. Lactation Consultation Note  Patient Name: Veronica Buchanan DGLOV'F Date: 07/19/2023 Age:28 hours Reason for consult: L&D Initial assessment;1st time breastfeeding;Early term 37-38.6wks MOB latched infant on her left breast using the football hold position, infant latched with depth and BF for 22 minutes, sustaining his latch. MOB will continue to BF infant by cues, on demand, every 2-3 hours, skin to skin. MOB knows to call for further latch assistance if needed on MBU.   Maternal Data Has patient been taught Hand Expression?: No Does the patient have breastfeeding experience prior to this delivery?: No  Feeding Mother's Current Feeding Choice: Breast Milk  LATCH Score Latch: Grasps breast easily, tongue down, lips flanged, rhythmical sucking.  Audible Swallowing: Spontaneous and intermittent  Type of Nipple: Everted at rest and after stimulation  Comfort (Breast/Nipple): Soft / non-tender  Hold (Positioning): Full assist, staff holds infant at breast  LATCH Score: 8   Lactation Tools Discussed/Used    Interventions Interventions: Assisted with latch;Skin to skin;Breast compression;Adjust position;Support pillows;Position options;Education  Discharge    Consult Status Consult Status: Follow-up from L&D Date: 07/20/23 Follow-up type: In-patient    Frederico Hamman 07/19/2023, 4:11 PM

## 2023-07-20 LAB — CBC
HCT: 31.4 % — ABNORMAL LOW (ref 36.0–46.0)
Hemoglobin: 9.7 g/dL — ABNORMAL LOW (ref 12.0–15.0)
MCH: 22.9 pg — ABNORMAL LOW (ref 26.0–34.0)
MCHC: 30.9 g/dL (ref 30.0–36.0)
MCV: 74.2 fL — ABNORMAL LOW (ref 80.0–100.0)
Platelets: 292 10*3/uL (ref 150–400)
RBC: 4.23 MIL/uL (ref 3.87–5.11)
RDW: 14 % (ref 11.5–15.5)
WBC: 13.5 10*3/uL — ABNORMAL HIGH (ref 4.0–10.5)
nRBC: 0 % (ref 0.0–0.2)

## 2023-07-20 MED ORDER — IBUPROFEN 600 MG PO TABS: 600.0000 mg | ORAL_TABLET | Freq: Four times a day (QID) | ORAL | 0 refills | Status: DC

## 2023-07-20 NOTE — Clinical Social Work Maternal (Signed)
 CLINICAL SOCIAL WORK MATERNAL/CHILD NOTE   Patient Details  Name: Veronica Buchanan MRN: [id number] Date of Birth: 04/02/1996   Date:  07-Mar-2024   Clinical Social Worker Initiating Note:  Willaim Rayas Delane Stalling      Date/Time: Initiated:  07/20/23/1341      Child's Name:  Beverely Low    Biological Parents:  Mother, Father [name] Scheer 10/29/1995,  Shaheim Mahar 02/05/1997)    Need for Interpreter:  None    Reason for Referral:  Behavioral Health Concerns    Address:  [address] [place] [address]    Phone number:  [phone] (home)      Additional phone number:    Household Members/Support Persons (HM/SP):   Household Member/Support Person 1     HM/SP Name Relationship DOB or Age  HM/SP -1 Bonny Egger FOB 28/13/1998  HM/SP -2     HM/SP -3     HM/SP -4     HM/SP -5     HM/SP -6     HM/SP -7     HM/SP -8         Natural Supports (not living in the home):       Professional Supports: None    Employment: Unemployed    Type of Work:      Education:  Haematologist arranged:     Surveyor, quantity Resources:  [employer], Media planner     Other Resources:  Sales executive  , [facility]    Cultural/Religious Considerations Which May Impact Care:     Strengths:  Ability to meet basic needs  , Home prepared for child      Psychotropic Medications:          Pediatrician:        Pediatrician List:    [employer]   [place]   [place] [facility]       Pediatrician Fax Number:     Risk Factors/Current Problems:  None    Cognitive State:  Alert  , Able to Concentrate      Mood/Affect:  Calm  , Comfortable      CSW Assessment: CSW received a consult for MOB 28 hx. CSW met with MOB to complete assessment and offer support. CSW entered the room and observed MOB walking around the room, FOB at beside. The infant was out of the room having a circumcision procedure. CSW introduced self, CSW  role and reason for visit. MOB was agreeable to visit and allowed FOB to remain in the room. CSW inquired about how MOB was feeling, MOB reported good just sore. CSW confirmed MOB's address and phone number, MOB verified the information was correct. CSW inquired about MOB MH hx, MOB reported she was diagnosed as a child around 14 years old. MOB reported she was taking hydroxyzine and sertraline prior to pregnancy and plans to restart the medication now that she has delivered. MOB reported she was seeing a therapist but she has to find a new one. CSW assessed for safety, MOB denied any SI or HI. CSW provided education regarding the baby blues period vs. perinatal mood disorders, discussed treatment and gave resources for mental health follow up if concerns arise.  CSW recommends self-evaluation during the postpartum time period using the New Mom Checklist from Postpartum Progress and encouraged MOB to contact a medical professional if symptoms are noted at any time.  MOB identified FOB and her [place] friend  as her supports.    CSW provided review of Sudden Infant Death Syndrome (SIDS) precautions.  MOB reported they have all necessary items for the infant including a bassinet, crib and car seat.  CSW identifies no further need for intervention and no barriers to discharge at this time.   CSW Plan/Description:  No Further Intervention Required/No Barriers to Discharge, Sudden Infant Death Syndrome (SIDS) Education, Perinatal Mood and Anxiety Disorder (PMADs) Education      Maud Deed, LCSW Jan 21, 2024, 1:57 PM

## 2023-07-20 NOTE — Lactation Note (Signed)
 This note was copied from a baby's chart. Lactation Consultation Note  Patient Name: Veronica Buchanan [id number] Date: 07/20/2023 Age:28 hours Reason for consult: Follow-up assessment;Primapara;1st time breastfeeding;Early term 37-38.6wks;Maternal endocrine disorder  P1- MOB reports that infant has been nursing well and she feels shocked infant has gained weight vs lost weight. MOB reports no questions or concerns at this time. LC reviewed engorgement and breast care. LC encouraged MOB to call for further assistance as needed.  Maternal Data Has patient been taught Hand Expression?: Yes Does the patient have breastfeeding experience prior to this delivery?: No  Feeding Mother's Current Feeding Choice: Breast Milk  Lactation Tools Discussed/Used Pump Education: Milk Storage  Interventions Interventions: Breast feeding basics reviewed;Education;LC Services brochure  Discharge Discharge Education: Warning signs for feeding baby;Engorgement and breast care Pump: DEBP;Manual;Personal  Consult Status Consult Status: Complete Date: 07/20/23    Dema Severin BS, IBCLC 07/20/2023, 6:44 PM

## 2023-07-20 NOTE — Discharge Summary (Signed)
 Postpartum Discharge Summary      Patient Name: Veronica Buchanan DOB: 1996-04-13 MRN: [id number]  Date of admission: 07/18/2023 Delivery date:07/19/2023 Delivering provider: Wyn Forster Date of discharge: 07/20/2023  Admitting diagnosis: Vaginal bleeding in pregnancy, third trimester [O46.93] Intrauterine pregnancy: [redacted]w[redacted]d     Secondary diagnosis:  Principal Problem:   Vaginal bleeding in pregnancy, third trimester Active Problems:   NSVD (normal spontaneous vaginal delivery)   Obstetrical laceration, second degree  Additional problems: none    Discharge diagnosis: Term Pregnancy Delivered                                              Post partum procedures: none Augmentation: AROM Complications: None  Hospital course: Induction of Labor With Vaginal Delivery   28 y.o. yo G1P1001 at [redacted]w[redacted]d was admitted to the hospital 07/18/2023 for induction of labor.  Indication for induction:  decreased fetal movement .  Patient had an labor course complicated by nothing Membrane Rupture Time/Date: 3:45 AM,07/19/2023  Delivery Method:Vaginal, Spontaneous Operative Delivery:N/A Episiotomy: None Lacerations:  None Details of delivery can be found in separate delivery note.  Patient had a postpartum course complicated by nothing. Patient is discharged home 07/20/23.  Newborn Data: Birth date:07/19/2023 Birth time:3:04 PM Gender:Female Living status:Living Apgars:8 ,8  Weight:2890 g  Magnesium Sulfate received: No BMZ received: No Rhophylac:N/A Transfusion:No  Immunizations received: Immunization History  Administered Date(s) Administered   DTP / HiB 03/28/1996, 06/02/1996, 07/09/1996, 07/19/1996, 01/11/1997   DTaP 03/28/1996, 06/02/1996, 07/09/1996, 07/19/1996, 01/11/1997, 01/13/2000   Dtap, Unspecified 03/28/1996   HIB (PRP-T) 03/28/1996, 06/02/1996, 07/09/1996, 07/19/1996, 01/11/1997   HIB, Unspecified 03/28/1996, 06/02/1996, 07/09/1996, 07/19/1996, 01/11/1997   HPV 9-valent  12/22/2018, 03/15/2019   HPV Quadrivalent 07/07/2006, 01/04/2008, 12/18/2008   Hep B, Unspecified 12/06/1995, 03/28/1996, 06/02/1996   Hepatitis A, Ped/Adol-2 Dose 07/07/2006, 01/06/2007   Hepatitis B, PED/ADOLESCENT 1995/08/10, 03/28/1996, 10/04/2009, 03/26/2010, 08/28/2010   IPV 03/28/1996, 06/02/1996, 01/13/2000   Influenza Nasal 01/04/2008, 01/10/2010   Influenza Split 03/20/2011   Influenza,inj,Quad PF,6+ Mos 04/05/2014, 05/29/2015, 02/29/2016   Influenza,inj,quad, With Preservative 04/05/2014   Influenza-Unspecified 04/05/2014, 05/29/2015, 01/25/2016, 02/29/2016   MMR 01/11/1997, 01/13/2000   Meningococcal Acwy, Unspecified 01/04/2008   Meningococcal Conjugate 01/04/2008   Moderna Sars-Covid-2 Vaccination 07/06/2020, 08/03/2020   OPV 01/11/1997   Pneumococcal Polysaccharide-23 09/09/2016   Polio, Unspecified 03/28/1996, 06/02/1996, 01/11/1997, 01/12/2010   Tdap 01/06/2007   Varicella 01/11/1997    Physical exam  Vitals:   07/19/23 2220 07/19/23 2256 07/20/23 0506 07/20/23 0925  BP: 117/76  119/73 117/74  Pulse: 68  67 79  Resp: 16  18 18   Temp: 98.5 F (36.9 C)  98.2 F (36.8 C) 98.2 F (36.8 C)  TempSrc: Oral  Oral Oral  SpO2:      Weight:  77.3 kg    Height:  5\' 6"  (1.676 m)     General: alert, cooperative, and no distress Lochia: appropriate Uterine Fundus: firm Incision: N/A DVT Evaluation: No evidence of DVT seen on physical exam. Labs: Lab Results  Component Value Date   WBC 13.5 (H) 07/20/2023   HGB 9.7 (L) 07/20/2023   HCT 31.4 (L) 07/20/2023   MCV 74.2 (L) 07/20/2023   PLT 292 07/20/2023      Latest Ref Rng & Units 06/10/2023    1:13 PM  CMP  Glucose 70 - 99 mg/dL 79   BUN 6 -  20 mg/dL <5   Creatinine 7.82 - 1.00 mg/dL 9.56   Sodium 213 - 086 mmol/L 136   Potassium 3.5 - 5.1 mmol/L 4.1   Chloride 98 - 111 mmol/L 103   CO2 22 - 32 mmol/L 24   Calcium 8.9 - 10.3 mg/dL 9.5   Total Protein 6.5 - 8.1 g/dL 7.0   Total Bilirubin 0.0 - 1.2 mg/dL  0.5   Alkaline Phos 38 - 126 U/L 148   AST 15 - 41 U/L 20   ALT 0 - 44 U/L 14    Edinburgh Score:    07/19/2023   10:20 PM  Edinburgh Postnatal Depression Scale Screening Tool  I have been able to laugh and see the funny side of things. 0  I have looked forward with enjoyment to things. 0  I have blamed myself unnecessarily when things went wrong. 0  I have been anxious or worried for no good reason. 1  I have felt scared or panicky for no good reason. 0  Things have been getting on top of me. 0  I have been so unhappy that I have had difficulty sleeping. 0  I have felt sad or miserable. 0  I have been so unhappy that I have been crying. 1  The thought of harming myself has occurred to me. 0  Edinburgh Postnatal Depression Scale Total 2   Edinburgh Postnatal Depression Scale Total: 2   After visit meds:  Allergies as of 07/20/2023       Reactions   Latex Itching, Other (See Comments)   burning Skin with subjective "Burning Paresthesia"   Other Anaphylaxis, Swelling   NUTS - mainly tree nut and peanuts Throat and Tongue Swelling   Peanut-containing Drug Products Swelling   Mouth swelling Mouth Swelling   Shellfish Allergy Swelling, Rash   Tape Dermatitis   Coconut Oil Hives   Justicia Adhatoda (malabar Nut Tree) [justicia Adhatoda]    Mainly pecans.        Medication List     STOP taking these medications    calcium carbonate 500 MG chewable tablet Commonly known as: TUMS - dosed in mg elemental calcium   cyclobenzaprine 5 MG tablet Commonly known as: FLEXERIL   NIFEdipine 10 MG capsule Commonly known as: Procardia   omeprazole 20 MG tablet Commonly known as: PriLOSEC OTC   ondansetron 4 MG disintegrating tablet Commonly known as: ZOFRAN-ODT   polyethylene glycol powder 17 GM/SCOOP powder Commonly known as: GLYCOLAX/MIRALAX   promethazine 25 MG tablet Commonly known as: PHENERGAN   sertraline 25 MG tablet Commonly known as: Zoloft   traMADol 50  MG tablet Commonly known as: ULTRAM       TAKE these medications    acetaminophen 500 MG tablet Commonly known as: TYLENOL Take 500 mg by mouth every 6 (six) hours as needed.   albuterol 108 (90 Base) MCG/ACT inhaler Commonly known as: VENTOLIN HFA Inhale 2 puffs into the lungs every 6 (six) hours as needed for wheezing or shortness of breath.   EPINEPHrine 0.3 mg/0.3 mL Soaj injection Commonly known as: EPI-PEN   famotidine 20 MG tablet Commonly known as: PEPCID Take 20 mg by mouth 2 (two) times daily.   hydrOXYzine 25 MG tablet Commonly known as: ATARAX Take 1 tablet (25 mg total) by mouth every 6 (six) hours as needed for itching or anxiety.   ibuprofen 600 MG tablet Commonly known as: ADVIL Take 1 tablet (600 mg total) by mouth every 6 (six) hours.  multivitamin-prenatal 27-0.8 MG Tabs tablet Take 1 tablet by mouth daily at 12 noon.         Discharge home in stable condition Infant Feeding: Bottle and Breast Infant Disposition:home with mother Discharge instruction: per After Visit Summary and Postpartum booklet. Activity: Advance as tolerated. Pelvic rest for 6 weeks.  Diet: routine diet Future Appointments: Future Appointments  Date Time Provider Department Center  07/22/2023  8:55 AM Girard Bing, MD [facility]  07/30/2023 10:55 AM Warden Fillers, MD [facility]  08/04/2023 10:55 AM Richardson Landry, CNM [facility]  08/11/2023 10:15 AM Jonah Blue Clarita Crane, CNM [facility]   Follow up Visit:  Follow-up Information     Center for [facility] at [facility] for Women Follow up.   Specialty: Obstetrics and Gynecology Why: Routine postpartum follow up in 4-6 weeks Contact information: [address] [place] [address] [phone]               Message sent 2/24  Please schedule this patient for a In person postpartum visit in 4 weeks with the following provider: Any provider. Additional Postpartum  F/U: none   Low risk pregnancy complicated by:  nothing Delivery mode:  Vaginal, Spontaneous Anticipated Birth Control:  Unsure   07/20/2023 Silvano Bilis, MD

## 2023-07-20 NOTE — Anesthesia Postprocedure Evaluation (Signed)
 Anesthesia Post Note  Patient: Veronica Buchanan  Procedure(s) Performed: AN AD HOC LABOR EPIDURAL     Patient location during evaluation: Mother Baby Anesthesia Type: Epidural Level of consciousness: awake and alert Pain management: pain level controlled Vital Signs Assessment: post-procedure vital signs reviewed and stable Respiratory status: spontaneous breathing, nonlabored ventilation and respiratory function stable Cardiovascular status: stable Postop Assessment: no headache, no backache and epidural receding Anesthetic complications: no   No notable events documented.  Last Vitals:  Vitals:   07/19/23 2220 07/20/23 0506  BP: 117/76 119/73  Pulse: 68 67  Resp: 16 18  Temp: 36.9 C 36.8 C  SpO2:      Last Pain:  Vitals:   07/20/23 0605  TempSrc:   PainSc: 0-No pain   Pain Goal:                   Rajinder Mesick

## 2023-07-22 ENCOUNTER — Encounter: Payer: 59 | Admitting: Obstetrics and Gynecology

## 2023-07-28 ENCOUNTER — Emergency Department (HOSPITAL_BASED_OUTPATIENT_CLINIC_OR_DEPARTMENT_OTHER)
Admission: EM | Admit: 2023-07-28 | Discharge: 2023-07-29 | Disposition: A | Discharge status: Home / Self Care | Attending: Emergency Medicine | Admitting: Emergency Medicine

## 2023-07-28 ENCOUNTER — Emergency Department (HOSPITAL_BASED_OUTPATIENT_CLINIC_OR_DEPARTMENT_OTHER): Admitting: Radiology

## 2023-07-28 ENCOUNTER — Other Ambulatory Visit: Payer: Self-pay

## 2023-07-28 ENCOUNTER — Emergency Department (HOSPITAL_BASED_OUTPATIENT_CLINIC_OR_DEPARTMENT_OTHER)

## 2023-07-28 ENCOUNTER — Telehealth (HOSPITAL_COMMUNITY): Payer: Self-pay

## 2023-07-28 DIAGNOSIS — I517 Cardiomegaly: Secondary | ICD-10-CM | POA: Diagnosis not present

## 2023-07-28 DIAGNOSIS — R079 Chest pain, unspecified: Secondary | ICD-10-CM | POA: Insufficient documentation

## 2023-07-28 DIAGNOSIS — R002 Palpitations: Secondary | ICD-10-CM | POA: Diagnosis not present

## 2023-07-28 DIAGNOSIS — Z91040 Latex allergy status: Secondary | ICD-10-CM | POA: Insufficient documentation

## 2023-07-28 DIAGNOSIS — Z91010 Allergy to peanuts: Secondary | ICD-10-CM | POA: Insufficient documentation

## 2023-07-28 LAB — BASIC METABOLIC PANEL
Anion gap: 10 (ref 5–15)
BUN: 8 mg/dL (ref 6–20)
CO2: 26 mmol/L (ref 22–32)
Calcium: 8.7 mg/dL — ABNORMAL LOW (ref 8.9–10.3)
Chloride: 106 mmol/L (ref 98–111)
Creatinine, Ser: 0.65 mg/dL (ref 0.44–1.00)
GFR, Estimated: >60 mL/min (ref >60)
Glucose, Bld: 87 mg/dL (ref 70–99)
Potassium: 3.7 mmol/L (ref 3.5–5.1)
Sodium: 142 mmol/L (ref 135–145)

## 2023-07-28 LAB — CBC
HCT: 38.3 % (ref 36.0–46.0)
Hemoglobin: 12 g/dL (ref 12.0–15.0)
MCH: 23.5 pg — ABNORMAL LOW (ref 26.0–34.0)
MCHC: 31.3 g/dL (ref 30.0–36.0)
MCV: 75.1 fL — ABNORMAL LOW (ref 80.0–100.0)
Platelets: 446 10*3/uL — ABNORMAL HIGH (ref 150–400)
RBC: 5.1 MIL/uL (ref 3.87–5.11)
RDW: 14 % (ref 11.5–15.5)
WBC: 7.3 10*3/uL (ref 4.0–10.5)
nRBC: 0 % (ref 0.0–0.2)

## 2023-07-28 LAB — PREGNANCY, URINE: Preg Test, Ur: POSITIVE — AB

## 2023-07-28 LAB — HCG, QUANTITATIVE, PREGNANCY: hCG, Beta Chain, Quant, S: 54 m[IU]/mL — ABNORMAL HIGH (ref <5)

## 2023-07-28 LAB — TROPONIN I (HIGH SENSITIVITY): Troponin I (High Sensitivity): 3 ng/L (ref <18)

## 2023-07-28 MED ORDER — IOHEXOL 350 MG/ML SOLN
100.0000 mL | Freq: Once | INTRAVENOUS | Status: AC | PRN
  Administered 2023-07-28: 75 mL via INTRAVENOUS

## 2023-07-28 NOTE — Telephone Encounter (Signed)
 07/28/2023 1638  Name: Veronica Buchanan MRN: [id number] DOB: 1996/05/05  Reason for Call:  Transition of Care Hospital Discharge Call  Contact Status: Patient Contact Status: Complete  Language assistant needed:          Follow-Up Questions: Do You Have Any Concerns About Your Health As You Heal From Delivery?: Yes What Concerns Do You Have About Your Health?: Patient states that she just talked to her doctor's office and they told her to come to the hospital because she is having chest pain. RN told patient that she should come to the hospital emergency room. RN asked patient if she has someone to drive her to the hospital? She states that she does not have someone to drive her that she was about to leave and drive herself. RN told patient to dial 911 because she would not be safe to operate a vehicle if she was having an emergency event. Patient agrees to dialing 911 and comeing to the emergency room.  Edinburgh Postnatal Depression Scale:  In the Past 7 Days:    PHQ2-9 Depression Scale:     Discharge Follow-up:    Post-discharge interventions: NA  Signature  Signe Colt

## 2023-07-28 NOTE — ED Triage Notes (Signed)
 1/23 Gave birth, first child.  CP- 3-4 days- stabbing. SOB at times- worse at night laying down. Tolerating fluids. Pain in right arm-worsened with movement- no swelling appreciated.

## 2023-07-28 NOTE — ED Provider Notes (Signed)
 [place] EMERGENCY DEPARTMENT AT [facility] Provider Note   CSN: [id number] Arrival date & time: 07/28/23  1820     History  No chief complaint on file.   Veronica Buchanan is a 28 y.o. female.  27 yo F with a chief complaints of chest pain.  Going on for about 3 to 4 days.  Diffuse across the chest.  Sometimes feels sharp and sometimes as a pressure.  She also sometimes feels like maybe she gets palpitations.  Notes when she pumps the symptoms tend to get a bit worse.  She denies trauma denies cough congestion or fever.        Home Medications Prior to Admission medications   Medication Sig Start Date End Date Taking? Authorizing Provider  acetaminophen (TYLENOL) 500 MG tablet Take 500 mg by mouth every 6 (six) hours as needed.    [provider]  albuterol (PROVENTIL HFA;VENTOLIN HFA) 108 (90 BASE) MCG/ACT inhaler Inhale 2 puffs into the lungs every 6 (six) hours as needed for wheezing or shortness of breath. Patient not taking: Reported on 07/15/2023    [provider]  EPINEPHrine 0.3 mg/0.3 mL IJ SOAJ injection  04/24/20   [provider]  famotidine (PEPCID) 20 MG tablet Take 20 mg by mouth 2 (two) times daily.    [provider]  hydrOXYzine (ATARAX) 25 MG tablet Take 1 tablet (25 mg total) by mouth every 6 (six) hours as needed for itching or anxiety. Patient not taking: Reported on 07/08/2023 03/04/23   Federico Flake, MD  ibuprofen (ADVIL) 600 MG tablet Take 1 tablet (600 mg total) by mouth every 6 (six) hours. 07/20/23   Wouk, Wilfred Curtis, MD  Prenatal Vit-Fe Fumarate-FA (MULTIVITAMIN-PRENATAL) 27-0.8 MG TABS tablet Take 1 tablet by mouth daily at 12 noon.    [provider]      Allergies    Latex, Other, Peanut-containing drug products, Shellfish allergy, Tape, Coconut oil, and Justicia adhatoda (malabar nut tree) [justicia adhatoda]    Review of Systems   Review of Systems  Physical Exam Updated Vital  Signs BP 112/78   Pulse 78   Temp 98.6 F (37 C)   Resp 18   LMP 10/31/2022 (Exact Date)   SpO2 99%  Physical Exam Vitals and nursing note reviewed.  Constitutional:      General: She is not in acute distress.    Appearance: She is well-developed. She is not diaphoretic.  HENT:     Head: Normocephalic and atraumatic.  Eyes:     Pupils: Pupils are equal, round, and reactive to light.  Cardiovascular:     Rate and Rhythm: Normal rate and regular rhythm.     Heart sounds: No murmur heard.    No friction rub. No gallop.  Pulmonary:     Effort: Pulmonary effort is normal.     Breath sounds: No wheezing or rales.  Abdominal:     General: There is no distension.     Palpations: Abdomen is soft.     Tenderness: There is no abdominal tenderness.  Musculoskeletal:        General: No tenderness.     Cervical back: Normal range of motion and neck supple.     Comments: Pain with palpation of the anterior chest diffusely.  Skin:    General: Skin is warm and dry.  Neurological:     Mental Status: She is alert and oriented to person, place, and time.  Psychiatric:  Behavior: Behavior normal.     ED Results / Procedures / Treatments   Labs (all labs ordered are listed, but only abnormal results are displayed) Labs Reviewed  BASIC METABOLIC PANEL - Abnormal; Notable for the following components:      Result Value   Calcium 8.7 (*)    All other components within normal limits  CBC - Abnormal; Notable for the following components:   MCV 75.1 (*)    MCH 23.5 (*)    Platelets 446 (*)    All other components within normal limits  PREGNANCY, URINE - Abnormal; Notable for the following components:   Preg Test, Ur POSITIVE (*)    All other components within normal limits  HCG, QUANTITATIVE, PREGNANCY - Abnormal; Notable for the following components:   hCG, Beta Chain, Quant, S 54 (*)    All other components within normal limits  TROPONIN I (HIGH SENSITIVITY)  TROPONIN I (HIGH  SENSITIVITY)    EKG EKG Interpretation Date/Time:  Tuesday July 28 2023 18:27:50 EST Ventricular Rate:  60 PR Interval:  142 QRS Duration:  72 QT Interval:  362 QTC Calculation: 362 R Axis:   60  Text Interpretation: Normal sinus rhythm Normal ECG No significant change since last tracing Confirmed by Melene Plan [phone]) on 07/28/2023 11:11:07 PM  Radiology CT Angio Chest PE W and/or Wo Contrast Result Date: 07/28/2023 CLINICAL DATA:  Chest pain and shortness of breath at times worse at night when lying down. PE suspected. EXAM: CT ANGIOGRAPHY CHEST WITH CONTRAST TECHNIQUE: Multidetector CT imaging of the chest was performed using the standard protocol during bolus administration of intravenous contrast. Multiplanar CT image reconstructions and MIPs were obtained to evaluate the vascular anatomy. RADIATION DOSE REDUCTION: This exam was performed according to the departmental dose-optimization program which includes automated exposure control, adjustment of the mA and/or kV according to patient size and/or use of iterative reconstruction technique. CONTRAST:  75mL OMNIPAQUE IOHEXOL 350 MG/ML SOLN COMPARISON:  Chest radiograph 01/18/2022 FINDINGS: Cardiovascular: Negative for acute pulmonary embolism. Normal caliber thoracic aorta. No pericardial effusion. Mild cardiomegaly. Mediastinum/Nodes: Trachea and esophagus are unremarkable. No thoracic adenopathy Lungs/Pleura: No focal consolidation, pleural effusion, or pneumothorax. Upper Abdomen: No acute abnormality. Musculoskeletal: No acute fracture. Review of the MIP images confirms the above findings. IMPRESSION: 1. Negative for acute pulmonary embolism. 2. Mild cardiomegaly. Electronically Signed   By: Minerva Fester M.D.   On: 07/28/2023 22:08    Procedures Procedures    Medications Ordered in ED Medications  iohexol (OMNIPAQUE) 350 MG/ML injection 100 mL (75 mLs Intravenous Contrast Given 07/28/23 2011)    ED Course/ Medical Decision Making/  A&P                                 Medical Decision Making Amount and/or Complexity of Data Reviewed Labs: ordered. Radiology: ordered.   28 yo F with a chief complaint of chest pain.  Reproduced on exam.  Atypical in nature. Recently delivered.  Patient was seen in the MSE process had troponin that was negative and had a PE scan that was also negative.  No anemia.  No significant electrolyte abnormalities.  Will treat as musculoskeletal.  PCP follow-up.   11:42 PM:  I have discussed the diagnosis/risks/treatment options with the patient.  Evaluation and diagnostic testing in the emergency department does not suggest an emergent condition requiring admission or immediate intervention beyond what has been performed at this time.  They will follow up with PCP. We also discussed returning to the ED immediately if new or worsening sx occur. We discussed the sx which are most concerning (e.g., sudden worsening pain, fever, inability to tolerate by mouth) that necessitate immediate return. Medications administered to the patient during their visit and any new prescriptions provided to the patient are listed below.  Medications given during this visit Medications  iohexol (OMNIPAQUE) 350 MG/ML injection 100 mL (75 mLs Intravenous Contrast Given 07/28/23 2011)     The patient appears reasonably screen and/or stabilized for discharge and I doubt any other medical condition or other [facility] requiring further screening, evaluation, or treatment in the ED at this time prior to discharge.          Final Clinical Impression(s) / ED Diagnoses Final diagnoses:  Nonspecific chest pain    Rx / DC Orders ED Discharge Orders     None         Melene Plan, DO 07/28/23 2342

## 2023-07-28 NOTE — ED Notes (Signed)
 Beverely Low MD for triage orders.

## 2023-07-28 NOTE — Discharge Instructions (Signed)
 I think your chest pain is musculoskeletal.  Typically we treat this with Tylenol and ibuprofen.  Please follow-up with your family doctor in the office.  Max otc meds below Take 4 over the counter ibuprofen tablets 3 times a day or 2 over-the-counter naproxen tablets twice a day for pain. Also take tylenol 1000mg (2 extra strength) four times a day.

## 2023-07-30 ENCOUNTER — Encounter: Payer: 59 | Admitting: Obstetrics and Gynecology

## 2023-08-04 ENCOUNTER — Encounter: Payer: Self-pay | Admitting: Certified Nurse Midwife

## 2023-08-11 ENCOUNTER — Encounter: Payer: Self-pay | Admitting: Certified Nurse Midwife

## 2023-08-18 ENCOUNTER — Encounter: Payer: Self-pay | Admitting: *Deleted

## 2023-08-26 ENCOUNTER — Encounter: Payer: Self-pay | Admitting: Obstetrics and Gynecology

## 2023-08-26 ENCOUNTER — Telehealth: Payer: 59 | Admitting: Obstetrics and Gynecology

## 2023-08-26 DIAGNOSIS — F53 Postpartum depression: Secondary | ICD-10-CM | POA: Insufficient documentation

## 2023-08-26 DIAGNOSIS — K5900 Constipation, unspecified: Secondary | ICD-10-CM

## 2023-08-26 MED ORDER — DOCUSATE SODIUM 100 MG PO CAPS: 100.0000 mg | ORAL_CAPSULE | Freq: Two times a day (BID) | ORAL | 0 refills | Status: DC

## 2023-08-26 NOTE — Progress Notes (Signed)
 Provider location: [facility] [employer] [facility]   Patient location: Home  I connected Veronica Buchanan on 08/26/23 at  3:35 PM EDT by Mychart Video Encounter and verified that I am speaking with the correct person using two identifiers.       I discussed the limitations, risks, security and privacy concerns of performing an evaluation and management service virtually and the availability of in person appointments. I also discussed with the patient that there may be a patient responsible charge related to this service. The patient expressed understanding and agreed to proceed.  Post Partum Visit Note Subjective:   Veronica Buchanan is a 28 y.o. G42P1001 female who presents for a postpartum visit. She is 5 weeks postpartum following a normal spontaneous vaginal delivery.  I have fully reviewed the prenatal and intrapartum course. The delivery was at 37/2 gestational weeks.  Anesthesia: epidural. Postpartum course has been good. Baby is doing well. Baby is feeding by breast. Bleeding no bleeding. Bowel function is  constipation . Bladder function is normal. Patient is not sexually active. Contraception method is none. Postpartum depression screening: positive.   The pregnancy intention screening data noted above was reviewed. Potential methods of contraception were discussed. The patient elected to proceed with No data recorded.   Edinburgh Postnatal Depression Scale - 08/26/23 1559       Edinburgh Postnatal Depression Scale:  In the Past 7 Days   I have been able to laugh and see the funny side of things. 1    I have looked forward with enjoyment to things. 1    I have blamed myself unnecessarily when things went wrong. 1    I have been anxious or worried for no good reason. 2    I have felt scared or panicky for no good reason. 1    Things have been getting on top of me. 1    I have been so unhappy that I have had difficulty sleeping. 1    I have felt sad or miserable. 1     I have been so unhappy that I have been crying. 1    The thought of harming myself has occurred to me. 0    Edinburgh Postnatal Depression Scale Total 10            The following portions of the patient's history were reviewed and updated as appropriate: allergies, current medications, past family history, past medical history, past social history, past surgical history, and problem list.  Review of Systems Pertinent items are noted in HPI.  Objective:  LMP 10/31/2022 (Exact Date)     General:  Alert, oriented and cooperative. Patient is in no acute distress.  Respiratory: Normal respiratory effort, no problems with respiration noted  Mental Status: Normal mood and affect. Normal behavior. Normal judgment and thought content.  Rest of physical exam deferred due to type of encounter   Assessment:   1. Postpartum care and examination (Primary)   2. Constipation, unspecified constipation type Discussed options for constipation including miralax, stool softener, prune juice, other "p" fruits - docusate sodium (COLACE) 100 MG capsule; Take 1 capsule (100 mg total) by mouth 2 (two) times daily.  Dispense: 10 capsule; Refill: 0  3. Postpartum depression Discussed options for support, would like to start with counseling  - Amb ref to Integrated Behavioral Health   Plan:  Essential components of care per ACOG recommendations:  1.  Mood and well being: Patient with positive depression screening today.  Reviewed local resources for support.  - Patient does not use tobacco.  - hx of drug use? No    2. Infant care and feeding:  -Patient currently breastmilk feeding? Yes, pumping, has met with lactation -Social determinants of health (SDOH) reviewed in EPIC. No concerns  3. Sexuality, contraception and birth spacing - Patient does not want a pregnancy in the next year.   - Reviewed forms of contraception in tiered fashion. Patient desired  today.   - Discussed birth spacing of 18  months  4. Sleep and fatigue -Encouraged family/partner/community support of 4 hrs of uninterrupted sleep to help with mood and fatigue  5. Physical Recovery  - Discussed patients delivery vaginal delivery and complications - Patient had a reports she had laceration that was repaired degree laceration  - Patient has urinary incontinence? No - Patient is safe to resume physical and sexual activity  6.  Health Maintenance - Last pap smear done 01/08/22 and was normal   7. Chronic Disease - PCP follow up  I provided 15 minutes of face-to-face time during this encounter.  Return in one year for annual [employer], FNP [facility] [employer]

## 2023-09-14 ENCOUNTER — Encounter: Payer: Self-pay | Admitting: *Deleted

## 2023-09-25 NOTE — BH Specialist Note (Signed)
 Integrated Behavioral Health via Telemedicine Visit  10/08/2023 Veronica Buchanan [id number]  Number of Integrated Behavioral Health Clinician visits: 1- Initial Visit  Session Start time: 1320   Session End time: 1357  Total time in minutes: 37   Referring Provider: Susi Eric, FNP Patient/Family location: [facility] Provider location: [facility]  All persons participating in visit: Patient Veronica Buchanan and [facility] Johnny Buchanan   Types of Service: Individual psychotherapy and Telephone visit  I connected with Veronica Buchanan and/or Veronica Buchanan's n/a via  Telephone or Video Enabled Telemedicine Application  (Video is Caregility application) and verified that I am speaking with the correct person using two identifiers. Discussed confidentiality: Yes   I discussed the limitations of telemedicine and the availability of in person appointments.  Discussed there is a possibility of technology failure and discussed alternative modes of communication if that failure occurs.  I discussed that engaging in this telemedicine visit, they consent to the provision of behavioral healthcare and the services will be billed under their insurance.  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 Pt is coping by planning to take the summer off from classes and find work-from-home position, until she's ready to resume classes again, and with the support of her fiance. Pt is also experiencing low appetite due to nausea; was recommended to see neurologist postpartum and plans to  call to schedule an initial appointment.  Duration of problem: Postpartum; Severity of problem: moderate  Patient and/or Family's Strengths/Protective Factors: Social connections, Concrete supports in place (healthy food, safe environments, etc.), and Sense of purpose  Goals Addressed: Patient will:  Reduce symptoms of: anxiety, depression, and stress   Increase knowledge and/or ability of: healthy habits and stress reduction   Demonstrate ability to: Increase healthy adjustment to current life circumstances, Increase adequate support systems for patient/family, and Increase motivation to adhere to plan of care  Progress towards Goals: Ongoing  Interventions: Interventions utilized:  Motivational Interviewing, Psychoeducation and/or Health Education, Link to [employer], and Supportive Reflection Standardized Assessments completed: Not Needed  Patient and/or Family Response: Patient agrees with treatment plan.   Assessment: Patient currently experiencing History of bipolar disorder; Psychosocial stress.   Patient may benefit from psychoeducation and brief therapeutic interventions regarding coping with symptoms of anxiety, depression, life stress .  Plan: Follow up with behavioral health clinician on : One month Behavioral recommendations:  -Continue taking hydroxyzine  as prescribed (consider discussing with PCP about switching to taking at night, to see if daytime sleepiness decreases) -Continue plan to contact neurologist asap to schedule initial appointment -Continue plan to take a break from classes over the summer; consider discussion options with school -Continue plan to obtain work-from-home position prior to resuming classes; consider using [employer] as additional support, as discussed, for help with resume, etc. -Continue prioritizing healthy self-care (regular meals, adequate rest; allowing practical help from supportive friends and  family) -Consider new mom support group as needed at either www.postpartum.net or www.conehealthybaby.com   Referral(s): Integrated [employer] (In Clinic) and [employer]:  new parent support; parenting support  I discussed the assessment and treatment plan with the  patient and/or parent/guardian. They were provided an opportunity to ask questions and all were answered. They agreed with the plan and demonstrated an understanding of the instructions.   They were advised to call back or seek an in-person evaluation if the symptoms worsen or if the condition fails to improve as anticipated.  Elfredia Grippe Tobe Kervin, LCSW     05/13/2023    1:14 PM 12/18/2022    5:59 PM 12/04/2022   11:50 AM 03/10/2022    1:44 PM  Depression screen PHQ 2/9  Decreased Interest 1 1 1 2   Down, Depressed, Hopeless 1 0 0 0  PHQ - 2 Score 2 1 1 2   Altered sleeping 1 2 2 2   Tired, decreased energy 1 2 2    Change in appetite 1 1 0 2  Feeling bad or failure about yourself  0 0 0 0  Trouble concentrating 1 1 2 3   Moving slowly or fidgety/restless 0 0 0 1  Suicidal thoughts 0 0 0 0  PHQ-9 Score 6 7 7 10   Difficult doing work/chores    Not difficult at all      05/13/2023    1:14 PM 12/04/2022   11:50 AM 12/22/2018    2:01 PM  GAD 7 : Generalized Anxiety Score  Nervous, Anxious, on Edge 2 2 1   Control/stop worrying 1 1 1   Worry too much - different things 1 1 1   Trouble relaxing 0 0 1  Restless 0 0 3  Easily annoyed or irritable 1 1 1   Afraid - awful might happen 0 0 0  Total GAD 7 Score 5 5 8   Anxiety Difficulty   Not difficult at all      08/26/2023    3:59 PM 07/19/2023   10:20 PM  Edinburgh Postnatal Depression Scale Screening Tool  I have been able to laugh and see the funny side of things. 1 0  I have looked forward with enjoyment to things. 1 0  I have blamed myself unnecessarily when things went wrong. 1 0  I have been anxious or worried for no good reason. 2 1  I have felt  scared or panicky for no good reason. 1 0  Things have been getting on top of me. 1 0  I have been so unhappy that I have had difficulty sleeping. 1 0  I have felt sad or miserable. 1 0  I have been so unhappy that I have been crying. 1 1  The thought of harming myself has occurred to me. 0 0  Edinburgh Postnatal Depression Scale Total 10 2

## 2023-10-08 ENCOUNTER — Ambulatory Visit: Admitting: Clinical

## 2023-10-08 DIAGNOSIS — Z8659 Personal history of other mental and behavioral disorders: Secondary | ICD-10-CM | POA: Diagnosis not present

## 2023-10-08 DIAGNOSIS — F439 Reaction to severe stress, unspecified: Secondary | ICD-10-CM | POA: Diagnosis not present

## 2023-10-08 DIAGNOSIS — Z658 Other specified problems related to psychosocial circumstances: Secondary | ICD-10-CM

## 2023-10-08 NOTE — Patient Instructions (Signed)
 [facility] [address] [place], [place] [address] [phone] (main office) [phone] [facility] office)  New Parent Support Groups www.postpartum.net www.[email or web address]   [employer] www.[email or web address]               Parenting Resources  Zero to Three [email or web address].[employer] for [employer] www.[email or web address]   [employer] [email or web address]-[email or web address]   Happiest Baby on the Block [email or web address]  [email or web address]

## 2023-11-05 ENCOUNTER — Ambulatory Visit: Admitting: Clinical

## 2023-11-05 ENCOUNTER — Ambulatory Visit

## 2023-11-05 DIAGNOSIS — Z8659 Personal history of other mental and behavioral disorders: Secondary | ICD-10-CM

## 2023-11-05 NOTE — BH Specialist Note (Signed)
 Integrated Behavioral Health via Telemedicine Visit  11/06/2023 Veronica Buchanan [id number]  Number of Integrated Behavioral Health Clinician visits: 2- Second Visit  Session Start time: 1541   Session End time: 1607  Total time in minutes: 26  Referring Provider: Susi Eric, FNP Patient/Family location: Home [facility] Provider location: [facility]  All persons participating in visit: Patient Veronica Buchanan and [facility] Veronica Buchanan   Types of Service: Individual psychotherapy and Telephone visit  I connected with Veronica Buchanan and/or Veronica Buchanan's n/a via  Telephone or Video Enabled Telemedicine Application  (Video is Caregility application) and verified that I am speaking with the correct person using two identifiers. Discussed confidentiality: Yes   I discussed the limitations of telemedicine and the availability of in person appointments.  Discussed there is a possibility of technology failure and discussed alternative modes of communication if that failure occurs.  I discussed that engaging in this telemedicine visit, they consent to the provision of behavioral healthcare and the services will be billed under their insurance.  Patient and/or legal guardian expressed understanding and consented to Telemedicine visit: Yes   Presenting Concerns: Patient and/or family reports the following symptoms/concerns: Overwhelmed with fatigue; attributes to baby teething/poor sleep; also concerned about sharp pain in back (mid to lower); thinks her mattress may be making worse. Pt is getting her appetite back with less nausea, only mild nausea at night.  Duration of problem: Postpartum; Severity of problem: moderate  Patient and/or Family's Strengths/Protective Factors: Social connections, Concrete supports in place (healthy food, safe environments, etc.), and Sense of purpose  Goals Addressed: Patient will:  Reduce symptoms of: anxiety,  depression, and stress   Increase knowledge and/or ability of: healthy habits   Demonstrate ability to: Increase healthy adjustment to current life circumstances and Increase motivation to adhere to plan of care  Progress towards Goals: Ongoing    Interventions: Interventions utilized:  Motivational Interviewing and Supportive Reflection Standardized Assessments completed: GAD-7 and PHQ 9    Patient and/or Family Response: Patient agrees with treatment plan.   Clinical Assessment/Diagnosis  History of bipolar disorder  Psychosocial stressors   Patient may benefit from continued therapeutic intervention   Plan: Follow up with behavioral health clinician on : Three weeks Behavioral recommendations:  -At upcoming appointment with  PCP; discuss back pain and nausea at night -Continue plan to discuss adding purees to baby's diet with pediatrician -Continue plan to establish care with neurologist -Continue job search to find work-from-home position -Flip mattress as discussed to see if this improves comfort with sleep positioning -Continue prioritizing healthy self-care (regular meals, adequate rest; allowing practical help from supportive friends and family) for as long as needed -Consider new mom support group as needed at either [email or web address] or [email or web address]   Referral(s): Integrated [employer] (In Clinic)  I discussed the assessment and treatment plan with the patient and/or parent/guardian. They were provided an opportunity to ask questions and all were answered. They agreed with the plan and demonstrated an understanding of the instructions.   They were advised to call back or seek an in-person evaluation if the symptoms worsen or if the condition fails to improve as anticipated.  Elfredia Grippe Cristi Gwynn, LCSW     11/05/2023    3:49 PM 05/13/2023    1:14 PM 12/18/2022    5:59 PM 12/04/2022   11:50 AM 03/10/2022    1:44 PM  Depression screen  PHQ 2/9  Decreased Interest 2 1 1  1  2  Down, Depressed, Hopeless 0 1 0 0 0  PHQ - 2 Score 2 2 1 1 2   Altered sleeping 1 1 2 2 2   Tired, decreased energy 3 1 2 2    Change in appetite 0 1 1 0 2  Feeling bad or failure about yourself  0 0 0 0 0  Trouble concentrating 1 1 1 2 3   Moving slowly or fidgety/restless 0 0 0 0 1  Suicidal thoughts 0 0 0 0 0  PHQ-9 Score 7 6 7 7 10   Difficult doing work/chores     Not difficult at all      11/05/2023    3:51 PM 05/13/2023    1:14 PM 12/04/2022   11:50 AM 12/22/2018    2:01 PM  GAD 7 : Generalized Anxiety Score  Nervous, Anxious, on Edge 1 2 2 1   Control/stop worrying 1 1 1 1   Worry too much - different things 1 1 1 1   Trouble relaxing 0 0 0 1  Restless 0 0 0 3  Easily annoyed or irritable 0 1 1 1   Afraid - awful might happen 0 0 0 0  Total GAD 7 Score 3 5 5 8   Anxiety Difficulty    Not difficult at all

## 2023-11-05 NOTE — BH Specialist Note (Unsigned)
 Error

## 2023-11-26 ENCOUNTER — Encounter

## 2023-12-02 ENCOUNTER — Telehealth: Payer: Self-pay | Admitting: Neurology

## 2023-12-02 NOTE — Telephone Encounter (Signed)
 Called the patient. She had her baby in feb. Per our last discussion, we had advised the pt call in for an apt. I got her scheduled for next week

## 2023-12-02 NOTE — Telephone Encounter (Signed)
 Pt called  to let Md and Nurse know she had had her baby , and wanted to speak to nurse to follow up on treatment .

## 2023-12-09 ENCOUNTER — Ambulatory Visit (INDEPENDENT_AMBULATORY_CARE_PROVIDER_SITE_OTHER): Admitting: Neurology

## 2023-12-09 ENCOUNTER — Encounter: Payer: Self-pay | Admitting: Neurology

## 2023-12-09 VITALS — BP 105/71 | HR 79 | Ht 67.0 in | Wt 161.0 lb

## 2023-12-09 DIAGNOSIS — G4719 Other hypersomnia: Secondary | ICD-10-CM

## 2023-12-09 DIAGNOSIS — G47411 Narcolepsy with cataplexy: Secondary | ICD-10-CM | POA: Diagnosis not present
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
## 2023-12-09 NOTE — Patient Instructions (Signed)
 Initiate SUNOSI at 75 mg once daily in adults with narcolepsy. The recommended dose range for SUNOSI is 75 mg to 150 mg once daily. Based on efficacy and tolerability, the dosage of SUNOSI may be doubled at intervals of at least 3 days. The maximum recommended dose is 150 mg once daily.  Solriamfetol Tablets What is this medication? SOLRIAMFETOL (sol ri AM fe tol) treats sleep disorders, such as narcolepsy and obstructive sleep apnea. It works by promoting wakefulness. This medicine may be used for other purposes; ask your health care provider or pharmacist if you have questions. COMMON BRAND NAME(S): SUNOSI What should I tell my care team before I take this medication? They need to know if you have any of these conditions: Bipolar disorder Diabetes Heart disease High blood pressure High cholesterol History of stroke Kidney disease Schizophrenia Substance use disorder An unusual or allergic reaction to solriamfetol, other medications, foods, dyes, or preservatives Pregnant or trying to get pregnant Breast-feeding How should I use this medication? Take this medication by mouth with water. Take it as directed on the prescription label at the same time every day. You can take it with or without food. If it upsets your stomach, take it with food. Keep taking it unless your care team tells you to stop. A special MedGuide will be given to you by the pharmacist with each prescription and refill. Be sure to read this information carefully each time. Talk to your care team about the use of this medication in children. Special care may be needed. Overdosage: If you think you have taken too much of this medicine contact a poison control center or emergency room at once. NOTE: This medicine is only for you. Do not share this medicine with others. What if I miss a dose? If you miss a dose, take it as soon as you can. However, avoid taking it within 9 hours of your planned bedtime, since you may find  it harder to go to sleep. If it is almost time for your next dose, take only that dose. Do not take double or extra doses. What may interact with this medication? Do not take this medication with any of the following: MAOIs, such as Marplan, Nardil, and Parnate This medication may also interact with the following: Certain medications for Parkinson disease, such as levodopa, pramipexole, ropinirole Medications that increase blood pressure or heart rate This list may not describe all possible interactions. Give your health care provider a list of all the medicines, herbs, non-prescription drugs, or dietary supplements you use. Also tell them if you smoke, drink alcohol, or use illegal drugs. Some items may interact with your medicine. What should I watch for while using this medication? Visit your care team for regular checks on your progress. Tell your care team if your symptoms do not start to get better or if they get worse. This medication has a risk of abuse and dependence. Your care team will check you for this while you take this medication. Talk to your care team before breastfeeding. Changes to your treatment plan may be needed. What side effects may I notice from receiving this medication? Side effects that you should report to your care team as soon as possible: Allergic reactions--skin rash, itching, hives, swelling of the face, lips, tongue, or throat Fast or irregular heartbeat Heart attack--pain or tightness in the chest, shoulders, arms, or jaw, nausea, shortness of breath, cold or clammy skin, feeling faint or lightheaded Increase in blood pressure Mood and behavior  changes--anxiety, nervousness, confusion, hallucinations, irritability, hostility, thoughts of suicide or self-harm, worsening mood, feelings of depression Stroke--sudden numbness or weakness of the face, arm, or leg, trouble speaking, confusion, trouble walking, loss of balance or coordination, dizziness, severe  headache, change in vision Side effects that usually do not require medical attention (report these to your care team if they continue or are bothersome): Anxiety, nervousness Headache Loss of appetite Nausea Trouble sleeping This list may not describe all possible side effects. Call your doctor for medical advice about side effects. You may report side effects to FDA at 1-800-FDA-1088. Where should I keep my medication? Keep out of the reach of children and pets. This medication can be abused. Keep it in a safe place to protect it from theft. Do not share it with anyone. It is only for you. Selling or giving away this medication is dangerous and against the law. Store at room temperature between 20 and 25 degrees C (68 and 77 degrees F). This medication may cause harm or death if it is taken by other adults, children, or pets. It is important to get rid of the medication as soon as you no longer need it or it is expired. You can do this in two ways: Take the medication to a medication take-back program. Check with your pharmacy or law enforcement to find a location. If you cannot return the medication, check the label or package insert to see if the medication should be thrown out in the garbage or flushed down the toilet. If you are not sure, ask your care team. If it is safe to put it in the trash, empty the medication out of the container. Mix the medication with cat litter, dirt, or other unwanted substance. Seal the mixture in a bag or container. Put it in the trash. NOTE: This sheet is a summary. It may not cover all possible information. If you have questions about this medicine, talk to your doctor, pharmacist, or health care provider.  2024 Elsevier/Gold Standard (2021-11-29 00:00:00)

## 2023-12-24 ENCOUNTER — Ambulatory Visit: Admitting: Clinical

## 2023-12-24 DIAGNOSIS — Z91199 Patient's noncompliance with other medical treatment and regimen due to unspecified reason: Secondary | ICD-10-CM

## 2023-12-24 NOTE — BH Specialist Note (Signed)
Pt did not arrive to video visit and did not answer the phone; Unable to leave message as voicemail has not been set up; left MyChart message for patient.

## 2024-01-07 ENCOUNTER — Emergency Department (HOSPITAL_BASED_OUTPATIENT_CLINIC_OR_DEPARTMENT_OTHER)
Admission: EM | Admit: 2024-01-07 | Discharge: 2024-01-07 | Disposition: A | Discharge status: Home / Self Care | Attending: Emergency Medicine | Admitting: Emergency Medicine

## 2024-01-07 ENCOUNTER — Other Ambulatory Visit: Payer: Self-pay

## 2024-01-07 ENCOUNTER — Encounter (HOSPITAL_BASED_OUTPATIENT_CLINIC_OR_DEPARTMENT_OTHER): Payer: Self-pay

## 2024-01-07 ENCOUNTER — Emergency Department (HOSPITAL_BASED_OUTPATIENT_CLINIC_OR_DEPARTMENT_OTHER)

## 2024-01-07 DIAGNOSIS — R1012 Left upper quadrant pain: Secondary | ICD-10-CM | POA: Insufficient documentation

## 2024-01-07 DIAGNOSIS — Z91010 Allergy to peanuts: Secondary | ICD-10-CM | POA: Diagnosis not present

## 2024-01-07 DIAGNOSIS — N939 Abnormal uterine and vaginal bleeding, unspecified: Secondary | ICD-10-CM | POA: Diagnosis present

## 2024-01-07 DIAGNOSIS — Z91040 Latex allergy status: Secondary | ICD-10-CM | POA: Insufficient documentation

## 2024-01-07 DIAGNOSIS — R109 Unspecified abdominal pain: Secondary | ICD-10-CM

## 2024-01-07 DIAGNOSIS — J45909 Unspecified asthma, uncomplicated: Secondary | ICD-10-CM | POA: Insufficient documentation

## 2024-01-07 LAB — COMPREHENSIVE METABOLIC PANEL WITH GFR
ALT: 11 U/L (ref 0–44)
AST: 17 U/L (ref 15–41)
Albumin: 4.2 g/dL (ref 3.5–5.0)
Alkaline Phosphatase: 92 U/L (ref 38–126)
Anion gap: 12 (ref 5–15)
BUN: 9 mg/dL (ref 6–20)
CO2: 23 mmol/L (ref 22–32)
Calcium: 9.4 mg/dL (ref 8.9–10.3)
Chloride: 104 mmol/L (ref 98–111)
Creatinine, Ser: 0.66 mg/dL (ref 0.44–1.00)
GFR, Estimated: >60 mL/min (ref >60)
Glucose, Bld: 95 mg/dL (ref 70–99)
Potassium: 3.5 mmol/L (ref 3.5–5.1)
Sodium: 139 mmol/L (ref 135–145)
Total Bilirubin: 0.3 mg/dL (ref 0.0–1.2)
Total Protein: 6.9 g/dL (ref 6.5–8.1)

## 2024-01-07 LAB — WET PREP, GENITAL
Clue Cells Wet Prep HPF POC: NONE SEEN
Sperm: NONE SEEN
Trich, Wet Prep: NONE SEEN
WBC, Wet Prep HPF POC: <10 (ref <10)
Yeast Wet Prep HPF POC: NONE SEEN

## 2024-01-07 LAB — URINALYSIS, W/ REFLEX TO CULTURE (INFECTION SUSPECTED)
Bacteria, UA: NONE SEEN
Bilirubin Urine: NEGATIVE
Glucose, UA: NEGATIVE mg/dL
Ketones, ur: NEGATIVE mg/dL
Leukocytes,Ua: NEGATIVE
Nitrite: NEGATIVE
Protein, ur: NEGATIVE mg/dL
RBC / HPF: >50 RBC/hpf (ref 0–5)
Specific Gravity, Urine: 1.013 (ref 1.005–1.030)
pH: 6 (ref 5.0–8.0)

## 2024-01-07 LAB — CBC WITH DIFFERENTIAL/PLATELET
Abs Immature Granulocytes: 0.01 K/uL (ref 0.00–0.07)
Basophils Absolute: 0 K/uL (ref 0.0–0.1)
Basophils Relative: 1 %
Eosinophils Absolute: 0.1 K/uL (ref 0.0–0.5)
Eosinophils Relative: 2 %
HCT: 33.5 % — ABNORMAL LOW (ref 36.0–46.0)
Hemoglobin: 10.2 g/dL — ABNORMAL LOW (ref 12.0–15.0)
Immature Granulocytes: 0 %
Lymphocytes Relative: 36 %
Lymphs Abs: 2.2 K/uL (ref 0.7–4.0)
MCH: 22.5 pg — ABNORMAL LOW (ref 26.0–34.0)
MCHC: 30.4 g/dL (ref 30.0–36.0)
MCV: 73.8 fL — ABNORMAL LOW (ref 80.0–100.0)
Monocytes Absolute: 0.6 K/uL (ref 0.1–1.0)
Monocytes Relative: 10 %
Neutro Abs: 3.2 K/uL (ref 1.7–7.7)
Neutrophils Relative %: 51 %
Platelets: 355 K/uL (ref 150–400)
RBC: 4.54 MIL/uL (ref 3.87–5.11)
RDW: 14.3 % (ref 11.5–15.5)
WBC: 6.2 K/uL (ref 4.0–10.5)
nRBC: 0 % (ref 0.0–0.2)

## 2024-01-07 LAB — LIPASE, BLOOD: Lipase: 76 U/L — ABNORMAL HIGH (ref 11–51)

## 2024-01-07 LAB — PREGNANCY, URINE: Preg Test, Ur: NEGATIVE

## 2024-01-07 MED ORDER — KETOROLAC TROMETHAMINE 15 MG/ML IJ SOLN: 15.0000 mg | Freq: Once | INTRAMUSCULAR | Status: DC

## 2024-01-07 NOTE — Progress Notes (Signed)
 [facility]  URGENT CARE-- [place], [place]  Provider: Annabella Pines, PA-C    Patient Name: Veronica Buchanan [birth year]  Visit Date: 01/07/2024 Vitals:   01/07/24 0111  BP: 113/71  Pulse: 76  Resp: 16  Temp: 97.1 F (36.2 C)  SpO2: 100%    Chief Complaint  Patient presents with  . Abdominal Pain    Started having vaginal spotting on 8/2, stopped for a few days, came back a few days ago, started off dark red, then went brown, now is having a lot of bright red blood, no clots. Has been having stomach, flank and uterine cramping since yesterday. Says she's been feeling dizzy and nauseated for 3-4 days comes and goes. Using tylenol  for pain, last dose 8/12  . Nausea  . Dizziness  . Vaginal Bleeding    Subjective 28 year old female presents for evaluation of vaginal spotting that began on August 2. The bleeding stopped for a few days, then returned a few days ago. Initially dark red, then brown, and now bright red with no clots. She reports stomach, flank, and uterine cramping since yesterday. She also notes dizziness and nausea for the past 3-4 days, which comes and goes; at one point she felt like she might pass out. She has been using Tylenol  for pain, last dose on August 12.  Review of Systems (ROS) . Constitutional: No fever or chills; positive for intermittent dizziness . HEENT: No sore throat, ear pain, nasal congestion . Respiratory: No cough, shortness of breath . Cardiovascular: No chest pain, palpitations, or syncope; positive for near-syncope . Gastrointestinal: Positive for nausea; no vomiting, diarrhea, or constipation . Genitourinary: Positive for vaginal bleeding, uterine cramping, flank pain; denies dysuria or hematuria by history, though UA shows blood . Musculoskeletal: No joint pain or swelling . Skin: No rash or lesions . Neurologic: Positive for dizziness; no weakness, numbness, or speech changes . Psychiatric: No anxiety,  depression, or mood changes  Objective . Vitals: BP 113/71, HR 76, Temp 97.51F, RR 16, SpO2 100% RA . Physical Exam: . General: Alert, in no acute distress . Abdomen: Soft; tenderness to right lower quadrant with mild guarding; minimal periumbilical tenderness; no rebound; exam findings reproducible . Back: No CVA tenderness . Neuro: Alert and oriented 3, no focal deficits . Skin: Warm, dry, normal color  . Labs: SABRA Urine pregnancy: Negative . Urinalysis: . Color: Red . Clarity: Cloudy . Glucose: Negative . Bilirubin: Small . Specific Gravity: >1.030 . Blood: Large . Protein: >300 mg/dL . Nitrites: Positive . Leukocyte esterase: Negative  Assessment Abnormal uterine bleeding with right lower quadrant abdominal tenderness; concurrent abnormal urinalysis suggestive of possible urinary tract infection, hematuria from another source, or contamination from vaginal bleeding.  Differential Diagnosis . Appendicitis . Abnormal uterine bleeding (most likely for bleeding) . Ovarian cyst or torsion . UTI with hematuria . Nephrolithiasis . Structural gynecologic pathology (fibroids, polyps)   Plan . Patient may need a pelvic and/or abdominal ultrasound vs CT scan abd/pelvis to assess for appendicitis, ovarian pathology, or uterine abnormalities . Sent urine culture for confirmation . Sent to the ER for higher level of care.        1. RLQ abdominal pain      2. Abnormal vaginal bleeding  POC HCG Qualitative, Urine   POC Urinalysis Auto without Microscopic    3. Pelvic pain  POC HCG Qualitative, Urine   POC Urinalysis Auto without Microscopic      No orders of the defined  types were placed in this encounter.  Orders Placed This Encounter  Procedures  . POC HCG Qualitative, Urine  . POC Urinalysis Auto without Microscopic    Past medical and social histories, as well as medications and allergies, reviewed and updated in [facility].

## 2024-01-07 NOTE — ED Triage Notes (Signed)
 Presents after evaluation at [facility]  Says she began having vaginal spotting on 8/2. It then went away and manifested again several days ago much heavier.   Initially pain in LLQ but says when she was being seen it hurt when they pressed on RLQ as well.   Urine preg (-)  Ua dipstick showed nitrates, blood.   Encouraged to ED for further evaluation.
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
## 2024-01-07 NOTE — Discharge Instructions (Signed)
 For pain control you may take 1000 mg of acetaminophen (Tylenol) every 8 hours and/or 600 mg of Ibuprofen (Motrin, Advil, etc.) every 6-8 hours as needed.  Please limit acetaminophen (Tylenol) to 4000 mg and Ibuprofen (Motrin, Advil, etc.) to 2400 mg for a 24hr period. Please note that other over-the-counter medicine may contain acetaminophen or ibuprofen as a component of their ingredients.

## 2024-01-08 LAB — GC/CHLAMYDIA PROBE AMP (~~LOC~~) NOT AT ARMC
Chlamydia: NEGATIVE
Comment: NEGATIVE
Comment: NORMAL
Neisseria Gonorrhea: NEGATIVE

## 2024-01-26 NOTE — Progress Notes (Signed)
 [address] - AMBULATORY [facility]  - FAMILY MEDICINE [facility] [address] [place] [address]    Date of service:  01/26/2024  Name:  Veronica Buchanan  Date of Birth:  December 01, 1995   SUBJECTIVE   Patient ID: Veronica Buchanan is a 29 y.o. (DOB 18-Apr-1996) female  Chief Complaint  Patient presents with  . Possible Pregnancy    The patient was last seen 01/08/24. Had irregular cycle with heavy bleeding. Was seen at ED. Also seen in our office, given rx for lysteda. Pt states she did not take medication as the bleeding stopped a few days later. Is with new partner in last 3 months.  Using condoms but not always. Had negative preg test mid August.  Last coitus 4 days ago with condoms.  Having pelvic cramping on and off. No dysuria, drinking water, denies constipation.  Saw GYN post preg. Now had 35 month old.  Very busy with moving, working new job. Has lost weight.  Feels her anxiety is improved since previous partner out of her life.  Mom is supportive.  Not taking sertraline  now. Has not had to use hydroxyzine .  Had bc patches before pregnancy. Has not had since preg. Did not like IUD, pills. Has not seen GYN since this recent bleeding. Advised to call GYN and sch appt to be seen. Wants to start on patches, will order those and fu with GYN. Adv to start patches when she has first day of cycle. If no cycle, to recheck preg test.   Possible pregnancy Last period: 12/21/23. Symptoms: nausea, light headedness, delayed period Hx: irregular periods  Review of Systems  All other pertinent systems reviewed and are negative.  Social History[1]  The following portions of the patient's history were reviewed and updated as appropriate: allergies, current medications, past family history, past medical history, past social history, past surgical history and problem list. ROS:  See HPI No fever. No n/v.  No vaginal discharge or sx.  No  dysuria Has felt a little lightheaded. Is drinking water.    Reviewed ED notes and last note in office.  No CMZ/GC test completed. Had neg Wet prep. Urine + for GBS, had amoxil for this.  OBJECTIVE   Vitals:   01/26/24 1033  BP: 97/72  Pulse: 94  Resp: 16  Temp: 97.3 F (36.3 C)  TempSrc: Temporal  SpO2: 99%  Weight: 67 kg (147 lb 12.8 oz)  Height: 1.676 m (5' 6)    Body mass index is 23.86 kg/m. Wt Readings from Last 3 Encounters:  01/26/24 67 kg (147 lb 12.8 oz)  01/07/24 74.8 kg (165 lb)  12/02/23 73.7 kg (162 lb 6.4 oz)     Constitutional: Alert - NAD. Appears well-developed and well nourished.weight down 10 pounds, VSS Cardiovascular: Normal rate and rhythm.  Exam reveals no gallop and no friction rub.  No murmur heard.  Pulmonary/chest: Effort normal and breath sounds normal. No wheezes. No rales.  Gastro:soft, nontender, BS+ Skin: warm and dry, no rashes Psych: alert, cooperative, smiling   ASSESSMENT/PLAN   Problem List Items Addressed This Visit     Generalized anxiety disorder (Chronic)   Relevant Medications   hydrOXYzine  (ATARAX ) 25 mg tablet   Other Visit Diagnoses       Possible pregnancy    -  Primary   Relevant Orders   POC HCG Qualitative, Urine (Completed)     Irregular menstrual cycle  Relevant Medications   norelgestromin -ethin.estradioL  Pamalee) 150-35 mcg/24 hr ptwk   Other Relevant Orders   CBC with Differential     Lightheaded       Relevant Orders   CBC with Differential   Comprehensive Metabolic Panel     Bilateral lower abdominal cramping       Relevant Orders   CBC with Differential   Comprehensive Metabolic Panel   Chlamydia / Gonococcus (GC), NAAT     Encounter for initial prescription of transdermal patch hormonal contraceptive device       Relevant Medications   norelgestromin -ethin.estradioL  (Xulane) 150-35 mcg/24 hr ptwk      New medication dosing, side effects, benefits discussed. Call GYN for appt  Monitor  sx.  Labs today  Risks, benefits, and alternatives of the medication(s) and treatment plan(s) were discussed, and she expressed understanding. No barriers to treatment identified in this visit.   Return for as scheduled.    Current Outpatient Medications  Medication Instructions  . albuterol  HFA (PROVENTIL  HFA;VENTOLIN  HFA;PROAIR  HFA) 90 mcg/actuation inhaler USE 2 INHALATIONS BY MOUTH EVERY 6 HOURS AS NEEDED FOR WHEEZING  . beclomethasone dipropionate (Qvar RediHaler) 80 mcg/actuation 1 puff, inhalation, 2 times daily RT  . EPINEPHrine  (EPIPEN ) 0.3 mg/0.3 mL injection syringe ADMINISTER 0.3 MG IN THE MUSCLE 1 TIME FOR 1 DOSE  . hydrOXYzine  (ATARAX ) 25 mg, oral, Every 8 hours PRN  . norelgestromin -ethin.estradioL  (Xulane) 150-35 mcg/24 hr ptwk Apply 1 patch topically each week for 3 weeks to clean, dry, intact skin, withhold for 1 week, then repeat cycle. Start patch on day one of period  . solriamfetoL (SUNOSI) 150 mg, Every 24 hours   This document serves as a record of services personally performed by Santana Molt, FNP.  It was created on their behalf by Gwenlyn Cable, CMA, a trained medical scribe, and Certified Medical Assistant (CMA). During the course of documenting the history, physical exam and medical decision making, I was functioning as a Stage manager. The creation of this record is the provider's dictation and/or activities during the visit.   Electronically signed by: Gwenlyn Cable, CMA 01/26/2024 10:32 AM   I agree the documentation is accurate and complete.  Electronically signed by: Gwenlyn Cable, CMA 01/26/2024 10:27 AM  Electronically signed by: Santana Tarry Molt, FNP 01/26/2024 11:31 AM        [1] Social History Tobacco Use  . Smoking status: Every Day    Types: Cigars    Passive exposure: Never  . Smokeless tobacco: Never  . Tobacco comments:    Smokes approx 2 black and milds a day  Vaping Use  . Vaping status: Never Used  Substance Use Topics  . Alcohol  use: Not Currently    Comment: occ  . Drug use: Not Currently    Comment: Drug use: Drug Use: Not Currently; twice a week

## 2024-02-18 ENCOUNTER — Ambulatory Visit: Admitting: Advanced Practice Midwife

## 2024-03-08 ENCOUNTER — Ambulatory Visit: Admitting: Obstetrics and Gynecology

## 2024-03-10 ENCOUNTER — Ambulatory Visit: Admitting: Neurology

## 2024-03-10 ENCOUNTER — Encounter: Payer: Self-pay | Admitting: Neurology

## 2024-03-24 ENCOUNTER — Encounter: Payer: Self-pay | Admitting: Neurology

## 2024-04-03 ENCOUNTER — Other Ambulatory Visit: Payer: Self-pay | Admitting: Neurology

## 2024-04-03 DIAGNOSIS — G4753 Recurrent isolated sleep paralysis: Secondary | ICD-10-CM

## 2024-04-03 DIAGNOSIS — G4719 Other hypersomnia: Secondary | ICD-10-CM

## 2024-04-03 DIAGNOSIS — G47411 Narcolepsy with cataplexy: Secondary | ICD-10-CM

## 2024-04-03 MED ORDER — SUNOSI 150 MG PO TABS: ORAL_TABLET | ORAL | 5 refills | Status: DC

## 2024-04-03 NOTE — Progress Notes (Signed)
 MSLT documented shorter than normal mean sleep latency but no REM sleep onset.  The patient has tried SUNOSI and felt that cataplectic events and sleep paralysis have improved.   Sunosi is usually used to treat sleepiness in  narcolepsy and residual sleepiness in OSA. The patient did not test positive for narcolepsy with her MSLT result.  We will try to get a pre-authorization exemption.   Dedra Gores, MD

## 2024-04-13 ENCOUNTER — Telehealth: Payer: Self-pay

## 2024-04-13 ENCOUNTER — Other Ambulatory Visit (HOSPITAL_COMMUNITY): Payer: Self-pay

## 2024-04-13 NOTE — Telephone Encounter (Signed)
 Pharmacy Patient Advocate Encounter   Received notification from Fax that prior authorization for Sunosi 150mg  Tablet is required/requested.   Insurance verification completed.   The patient is insured through [facility].   Per test claim: PA required; PA submitted to above mentioned insurance via Latent Key/confirmation #/EOC [id number] Status is pending

## 2024-04-14 NOTE — Telephone Encounter (Signed)
 Dohmeier, Dedra, MD to Shaneque Petro Jas or Shari CD    04/03/24  5:40 PM I gave you Sunosi samples but may not be able to get them approved for long term use due to the MSLT being non-diagnostic. The alternative medication is  Modafinil or Armodafinil, but we run into the same problem, no narcolepsy diagnosis and no OSA diagnosis.

## 2024-04-14 NOTE — Telephone Encounter (Signed)
 Pharmacy Patient Advocate Encounter  Received notification from [employer] that Prior Authorization for Sunosi has been DENIED.  Full denial letter will be uploaded to the media tab. See denial reason below.   PA #/Case ID/Reference #: [id number]

## 2024-04-20 MED ORDER — MODAFINIL 200 MG PO TABS: 200.0000 mg | ORAL_TABLET | Freq: Every day | ORAL | 5 refills | Status: DC

## 2024-04-25 ENCOUNTER — Telehealth: Payer: Self-pay

## 2024-04-25 ENCOUNTER — Other Ambulatory Visit (HOSPITAL_COMMUNITY): Payer: Self-pay

## 2024-04-25 NOTE — Telephone Encounter (Signed)
 Pharmacy Patient Advocate Encounter   Received notification from Cover[employer]eds that prior authorization for Modafinil  200mg  Tablet is required/requested.   Insurance verification completed.   The patient is insured through [facility].   Per test claim: PA required; PA submitted to above mentioned insurance via Latent Key/confirmation #/EOC [id number] Status is pending

## 2024-04-25 NOTE — Telephone Encounter (Signed)
 Pharmacy Patient Advocate Encounter  Received notification from [employer] that Prior Authorization for Modafinil  200MG  tablets  has been APPROVED from 04/25/2024 to 10/24/2024. Ran test claim, Copay is $0. This test claim was processed through [facility]- copay amounts may vary at other pharmacies due to pharmacy/plan contracts, or as the patient moves through the different stages of their insurance plan.   PA #/Case ID/Reference #: [id number]

## 2024-04-25 NOTE — Telephone Encounter (Signed)
 I called and LMVM for pt that modafinil  was approved by insurance.  This for excessive sleepiness.

## 2024-05-16 NOTE — Progress Notes (Signed)
" °  Subjective Patient ID: Veronica Buchanan is a 28 y.o. female.  The following information was reviewed by members of the visit team:  Tobacco  Allergies  OB Status      HPI Veronica Buchanan is a 28 y.o. female who presents to urgent care with complaint of possible pregnancy.  Patient states that she is a day late for her menstrual cycle.  She took 2 pregnancy test at home, both positive.  Denies any vaginal bleeding.  No abdominal pain.  She has had some cramping over her lower back.  Denies any vaginal discharge.  No urinary symptoms.  Denies any fever, chills, malaise.  States this is her second pregnancy.  She is 10 months postpartum.  No other complaints  Objective  Physical Exam Vitals and nursing note reviewed.  Constitutional:      Appearance: Normal appearance. She is not ill-appearing or toxic-appearing.  HENT:     Head: Normocephalic and atraumatic.  Pulmonary:     Effort: Pulmonary effort is normal.  Abdominal:     Palpations: Abdomen is soft.     Tenderness: There is no abdominal tenderness. There is no right CVA tenderness, left CVA tenderness or guarding.  Skin:    General: Skin is warm and dry.  Neurological:     Mental Status: She is alert.  Psychiatric:        Mood and Affect: Mood normal.     Vitals:   05/16/24 0946  BP: 104/65  Pulse: 91  Temp: 98.7 F (37.1 C)  TempSrc: Oral  SpO2: 99%  Weight: 67.6 kg (149 lb)  Height: 1.702 m (5' 7)     Behavioral Health Screening  Patient Health Questionnaire-2 Score: 0 (05/16/2024  9:48 AM)      Patient's Depression screening/score = Negative    Depression Plan: Normal/Negative Screening    Assessment/Plan Diagnoses and all orders for this visit:  Positive pregnancy test (CMD) -     POC HCG Qualitative, Urine -     POC Urinalysis Auto without Microscopic  Low back pain, unspecified back pain laterality, unspecified chronicity, unspecified whether sciatica present -     POC HCG Qualitative, Urine -      POC Urinalysis Auto without Microscopic -     Urine Culture  Patient with possible pregnancy, no vaginal bleeding, abdominal pain, but is having some lower back cramping.  Urine pregnancy test in the office is positive.  Urine is cloudy, she is not having symptoms of UTI, will send her urine for culture.  No concern today for ectopic.  Discussed starting prenatal vitamins, I provided her with a printout of all allowed medications that are safe in pregnancy.  She will follow-up with OB/GYN for further prenatal care.  Return precautions discussed, if severe abdominal pain or vaginal bleeding go to emergency department.  Patient agreed to the plan.    Electronically signed: Tatyana Alexeyevna Kirichenko, PA-C 05/16/2024  10:12 AM  "

## 2024-05-17 ENCOUNTER — Telehealth: Payer: Self-pay

## 2024-05-17 NOTE — Telephone Encounter (Signed)
 Pt reports she had positive UPT at Urgent care yesterday after having positive tests at home, believes she is  about [redacted] weeks pregnant.  Since then she has started experiencing intermittent abdominal pain that she rates 8/10 when it comes.  Pt denies seeing any bright red blood or clots but has seen pink tinge when wiping.  Advised pt to go to MAU at [facility] for evaluation.  She verbalized understanding and had no further questions.    [place], RN

## 2024-05-18 ENCOUNTER — Inpatient Hospital Stay (HOSPITAL_COMMUNITY)
Admission: AD | Admit: 2024-05-18 | Discharge: 2024-05-18 | Disposition: A | Discharge status: Home / Self Care | Attending: Obstetrics and Gynecology | Admitting: Obstetrics and Gynecology

## 2024-05-18 ENCOUNTER — Inpatient Hospital Stay (HOSPITAL_COMMUNITY)

## 2024-05-18 ENCOUNTER — Encounter: Payer: Self-pay | Admitting: Student

## 2024-05-18 ENCOUNTER — Other Ambulatory Visit: Payer: Self-pay

## 2024-05-18 DIAGNOSIS — O3680X0 Pregnancy with inconclusive fetal viability, not applicable or unspecified: Secondary | ICD-10-CM | POA: Insufficient documentation

## 2024-05-18 DIAGNOSIS — O26891 Other specified pregnancy related conditions, first trimester: Secondary | ICD-10-CM | POA: Insufficient documentation

## 2024-05-18 DIAGNOSIS — O26899 Other specified pregnancy related conditions, unspecified trimester: Secondary | ICD-10-CM

## 2024-05-18 DIAGNOSIS — R109 Unspecified abdominal pain: Secondary | ICD-10-CM | POA: Diagnosis not present

## 2024-05-18 DIAGNOSIS — Z3201 Encounter for pregnancy test, result positive: Secondary | ICD-10-CM | POA: Diagnosis not present

## 2024-05-18 DIAGNOSIS — R1030 Lower abdominal pain, unspecified: Secondary | ICD-10-CM | POA: Insufficient documentation

## 2024-05-18 DIAGNOSIS — Z3A01 Less than 8 weeks gestation of pregnancy: Secondary | ICD-10-CM | POA: Diagnosis not present

## 2024-05-18 LAB — URINALYSIS, ROUTINE W REFLEX MICROSCOPIC
Bilirubin Urine: NEGATIVE
Glucose, UA: NEGATIVE mg/dL
Hgb urine dipstick: NEGATIVE
Ketones, ur: NEGATIVE mg/dL
Nitrite: NEGATIVE
Protein, ur: NEGATIVE mg/dL
Specific Gravity, Urine: 1.019 (ref 1.005–1.030)
WBC, UA: >50 WBC/hpf (ref 0–5)
pH: 5 (ref 5.0–8.0)

## 2024-05-18 LAB — WET PREP, GENITAL
Clue Cells Wet Prep HPF POC: NONE SEEN
Sperm: NONE SEEN
Trich, Wet Prep: NONE SEEN
WBC, Wet Prep HPF POC: >=10 — AB
Yeast Wet Prep HPF POC: NONE SEEN

## 2024-05-18 LAB — CBC
HCT: 32.6 % — ABNORMAL LOW (ref 36.0–46.0)
Hemoglobin: 10.1 g/dL — ABNORMAL LOW (ref 12.0–15.0)
MCH: 21.6 pg — ABNORMAL LOW (ref 26.0–34.0)
MCHC: 31 g/dL (ref 30.0–36.0)
MCV: 69.8 fL — ABNORMAL LOW (ref 80.0–100.0)
Platelets: 331 K/uL (ref 150–400)
RBC: 4.67 MIL/uL (ref 3.87–5.11)
RDW: 15.8 % — ABNORMAL HIGH (ref 11.5–15.5)
WBC: 5.2 K/uL (ref 4.0–10.5)
nRBC: 0 % (ref 0.0–0.2)

## 2024-05-18 LAB — HCG, QUANTITATIVE, PREGNANCY: hCG, Beta Chain, Quant, S: 267 m[IU]/mL — ABNORMAL HIGH
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
## 2024-05-18 NOTE — MAU Note (Signed)
 Veronica Buchanan is a 28 y.o. at Unknown here in MAU reporting: all over abd pain worse on left lower. Denies VB or discharge. Vaginal itching reported.   LMP: 04/17/24 Onset of complaint: 3 days ago Pain score: 8/10 Vitals:   05/18/24 1326  BP: 113/60  Pulse: (!) 57  Resp: 17  Temp: 98.7 F (37.1 C)  SpO2: 100%     FHT: na  Lab orders placed from triage: done by np

## 2024-05-18 NOTE — Discharge Instructions (Signed)
 Return to care  If you have heavier bleeding that soaks through more than 2 pads per hour for an hour or more If you bleed so much that you feel like you might pass out or you do pass out If you have significant abdominal pain that is not improved with Tylenol 

## 2024-05-19 LAB — CULTURE, OB URINE
Culture: NO GROWTH
Special Requests: NORMAL

## 2024-05-20 LAB — GC/CHLAMYDIA PROBE AMP (~~LOC~~) NOT AT ARMC
Chlamydia: NEGATIVE
Comment: NEGATIVE
Comment: NORMAL
Neisseria Gonorrhea: NEGATIVE

## 2024-05-21 ENCOUNTER — Inpatient Hospital Stay (HOSPITAL_COMMUNITY)
Admission: AD | Admit: 2024-05-21 | Discharge: 2024-05-21 | Disposition: A | Discharge status: Home / Self Care | Payer: Self-pay | Attending: Obstetrics & Gynecology | Admitting: Obstetrics & Gynecology

## 2024-05-21 ENCOUNTER — Inpatient Hospital Stay (HOSPITAL_COMMUNITY): Attending: Nurse Practitioner

## 2024-05-21 ENCOUNTER — Other Ambulatory Visit: Payer: Self-pay

## 2024-05-21 DIAGNOSIS — F419 Anxiety disorder, unspecified: Secondary | ICD-10-CM | POA: Insufficient documentation

## 2024-05-21 DIAGNOSIS — F319 Bipolar disorder, unspecified: Secondary | ICD-10-CM | POA: Diagnosis not present

## 2024-05-21 DIAGNOSIS — O3680X0 Pregnancy with inconclusive fetal viability, not applicable or unspecified: Secondary | ICD-10-CM

## 2024-05-21 DIAGNOSIS — O26891 Other specified pregnancy related conditions, first trimester: Secondary | ICD-10-CM | POA: Insufficient documentation

## 2024-05-21 DIAGNOSIS — O99341 Other mental disorders complicating pregnancy, first trimester: Secondary | ICD-10-CM | POA: Diagnosis present

## 2024-05-21 DIAGNOSIS — Z3A01 Less than 8 weeks gestation of pregnancy: Secondary | ICD-10-CM | POA: Diagnosis present

## 2024-05-21 DIAGNOSIS — R7989 Other specified abnormal findings of blood chemistry: Secondary | ICD-10-CM

## 2024-05-21 LAB — HCG, QUANTITATIVE, PREGNANCY: hCG, Beta Chain, Quant, S: 1050 m[IU]/mL — ABNORMAL HIGH

## 2024-05-21 LAB — CBC
HCT: 34.2 % — ABNORMAL LOW (ref 36.0–46.0)
Hemoglobin: 10.5 g/dL — ABNORMAL LOW (ref 12.0–15.0)
MCH: 21.7 pg — ABNORMAL LOW (ref 26.0–34.0)
MCHC: 30.7 g/dL (ref 30.0–36.0)
MCV: 70.8 fL — ABNORMAL LOW (ref 80.0–100.0)
Platelets: 400 K/uL (ref 150–400)
RBC: 4.83 MIL/uL (ref 3.87–5.11)
RDW: 16 % — ABNORMAL HIGH (ref 11.5–15.5)
WBC: 5.2 K/uL (ref 4.0–10.5)
nRBC: 0 % (ref 0.0–0.2)

## 2024-05-21 NOTE — MAU Provider Note (Signed)
 Chief Complaint:  Follow-up   HPI   None     Veronica Buchanan is a 28 y.o. G2P1001 at [redacted]w[redacted]d who presents to maternity admissions for repeat labs. Initially presented on 12/24 with lower abdominal pain. hCG at that time was 267 with ultrasound showing no IUGS, YS, or fetal pole. Continues with lower abdominal pain, using heating pad and taking 1g tylenol  every 6 hours with relief. Denies any vaginal bleeding or spotting, fever, chills, nausea or vomiting.   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  Peritoneal biopsies were taken and sent to pathology.     12/29/18 Surgical Pathology  Peritoneum, biopsy  - ENDOMETRIOSIS.  - NO EVIDENCE OF MALIGNANCY.     GERD (gastroesophageal reflux disease)    Migraine    Oropharyngeal dysphagia 04/06/2022   Osteochondroma    Palpitations 06/20/2021   Paresthesia 12/07/2019   Seizures (HCC)    ? 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   Whole body pain 12/07/2019   OB History  Gravida Para Term Preterm AB Living  2 1 1  0 0 1  SAB IAB Ectopic Multiple Live Births  0 0 0 0 1    # Outcome Date GA Lbr Len/2nd Weight Sex Type Anes PTL Lv  2 Current           1 Term 07/19/23 [redacted]w[redacted]d 06:27 / 04:52 2890 g M Vag-Spont EPI  LIV   Past Surgical History:  Procedure Laterality Date   BIOPSY N/A 12/29/2018   Procedure: Peritoneal biopsies;  Surgeon: Herchel Gloris LABOR, MD;  Location: [facility];  Service: Gynecology;  Laterality: N/A;   COLONOSCOPY     KNEE SURGERY Right 12/2020   LAPAROSCOPY N/A  12/29/2018   Procedure: LAPAROSCOPY DIAGNOSTIC;  Surgeon: Herchel Gloris LABOR, MD;  Location: [facility];  Service: Gynecology;  Laterality: N/A;   UPPER GI ENDOSCOPY     WISDOM TOOTH EXTRACTION     age 77    Family History  Problem Relation Age of Onset   Heart Problems Mother    Ovarian cancer Mother 12   Fibromyalgia Mother    Bursitis Mother    Osteoarthritis Mother    Asthma Father    Autism Sister    Autism Sister    Colon cancer Maternal Uncle    Multiple sclerosis Paternal Uncle    Heart Problems Maternal Grandfather    Lupus Cousin    Lupus Cousin    Asthma Other    Colon polyps Neg Hx    Esophageal cancer Neg Hx    Rectal cancer Neg Hx    Stomach cancer Neg Hx    Social History[1] Allergies[2] No medications prior to admission.    I have reviewed patient's Past Medical Hx, Surgical Hx, Family Hx, Social Hx, medications and allergies.   ROS  Pertinent items noted in HPI and remainder of comprehensive ROS otherwise negative.   PHYSICAL EXAM  Patient Vitals for the past 24 hrs:  BP Temp Temp src  Pulse Resp SpO2 Height Weight  05/21/24 0916 112/70 98.5 F (36.9 C) Oral 79 17 100 % 5' 7 (1.702 m) 69.6 kg    Constitutional: Well-developed, well-nourished female in no acute distress.  Cardiovascular: normal rate & rhythm, warm and well-perfused Respiratory: normal effort, no problems with respiration noted GI: Abd soft, non-tender, non-distended MS: Extremities nontender, no edema, normal ROM Neurologic: Alert and oriented x 4.  Pelvic: deferred       Labs: Results for orders placed or performed during the hospital encounter of 05/21/24 (from the past 24 hours)  hCG, quantitative, pregnancy     Status: Abnormal   Collection Time: 05/21/24  9:54 AM  Result Value Ref Range   hCG, Beta Chain, Quant, S 1,050 (H) <5 mIU/mL  CBC     Status: Abnormal   Collection Time: 05/21/24  9:54 AM  Result Value Ref Range   WBC 5.2 4.0 - 10.5 K/uL   RBC  4.83 3.87 - 5.11 MIL/uL   Hemoglobin 10.5 (L) 12.0 - 15.0 g/dL   HCT 65.7 (L) 63.9 - 53.9 %   MCV 70.8 (L) 80.0 - 100.0 fL   MCH 21.7 (L) 26.0 - 34.0 pg   MCHC 30.7 30.0 - 36.0 g/dL   RDW 83.9 (H) 88.4 - 84.4 %   Platelets 400 150 - 400 K/uL   nRBC 0.0 0.0 - 0.2 %    Imaging:  No results found.  MDM & MAU COURSE  MDM: Low  MAU Course: Orders Placed This Encounter  Procedures   hCG, quantitative, pregnancy   CBC   Discharge patient   Discharge patient Discharge disposition: 01-Home or Self Care; Discharge patient date: 05/21/2024   No orders of the defined types were placed in this encounter.  VSS. Exam unremarkable. Plan for CBC and hCG. Will plan to call patient with lab results, patient comfortable with plan.  12p - Labs resulted, hCG trending up appropriately at 1050. Discussed results over the phone, confirmed identity with last name and DOB. Plan for ultrasound in 2 weeks, message sent. Discussed return precautions extensively. All questions answered.   ASSESSMENT   1. Elevated serum hCG   2. Pregnancy of unknown anatomic location   3. [redacted] weeks gestation of pregnancy     PLAN  Discharge home in stable condition with return precautions.  Follow up in 2 weeks for repeat ultrasound.    Allergies as of 05/21/2024       Reactions   Latex Itching, Other (See Comments)   burning Skin with subjective Burning Paresthesia   Other Anaphylaxis, Swelling   NUTS - mainly tree nut and peanuts Throat and Tongue Swelling   Peanut-containing Drug Products Swelling   Mouth swelling Mouth Swelling   Shellfish Allergy Swelling, Rash   Tape Dermatitis   Coconut Oil Hives   Justicia Adhatoda (malabar Nut Tree) [justicia Adhatoda]    Mainly pecans.        Medication List     TAKE these medications    acetaminophen  500 MG tablet Commonly known as: TYLENOL  Take 500 mg by mouth every 6 (six) hours as needed.   albuterol  108 (90 Base) MCG/ACT inhaler Commonly known  as: VENTOLIN  HFA Inhale 2 puffs into the lungs every 6 (six) hours as needed for wheezing or shortness of breath.   EPINEPHrine  0.3 mg/0.3 mL Soaj injection Commonly known as: EPI-PEN   hydrOXYzine  25 MG tablet Commonly known as: ATARAX  Take 1 tablet (25 mg total) by mouth every 6 (six)  hours as needed for itching or anxiety.   multivitamin-prenatal 27-0.8 MG Tabs tablet Take 1 tablet by mouth daily at 12 noon.        Charlie Courts, MD  Family Medicine - Obstetrics Fellow        [1]  Social History Tobacco Use   Smoking status: Never    Passive exposure: Never   Smokeless tobacco: Current  Vaping Use   Vaping status: Former   Quit date: 04/25/2022  Substance Use Topics   Alcohol use: Not Currently   Drug use: Not Currently    Comment: 2-3 times per week  [2]  Allergies Allergen Reactions   Latex Itching and Other (See Comments)    burning  Skin with subjective Burning Paresthesia   Other Anaphylaxis and Swelling    NUTS - mainly tree nut and peanuts  Throat and Tongue Swelling   Peanut-Containing Drug Products Swelling    Mouth swelling  Mouth Swelling   Shellfish Allergy Swelling and Rash   Tape Dermatitis   Coconut Oil Hives   Justicia Adhatoda (Malabar Nut Tree) [Justicia Adhatoda]     Mainly pecans.

## 2024-05-21 NOTE — Discharge Instructions (Signed)
 You came into the hospital for repeat labs. We will call you at home with the lab results and next steps. I have included information on signs and symptoms of ectopic pregnancy - if any of these occur, please come back to the MAU for further evaluation.

## 2024-05-21 NOTE — MAU Note (Signed)
 Veronica Buchanan is a 28 y.o. at [redacted]w[redacted]d here in MAU reporting: denies any vaginal bleeding but is still cramping. Has not taken any medication for cramping, states she just woke up. Across entire lower abdomen and up in her ribs on both sides.    Onset of complaint: ongoing  Pain score: 8 Vitals:   05/21/24 0916  BP: 112/70  Pulse: 79  Resp: 17  Temp: 98.5 F (36.9 C)  SpO2: 100%     FHT:n/a Lab orders placed from triage:  repeat labs

## 2024-06-01 ENCOUNTER — Ambulatory Visit: Admitting: Obstetrics and Gynecology

## 2024-06-02 ENCOUNTER — Other Ambulatory Visit: Payer: Self-pay | Admitting: *Deleted

## 2024-06-02 DIAGNOSIS — O3680X0 Pregnancy with inconclusive fetal viability, not applicable or unspecified: Secondary | ICD-10-CM

## 2024-06-06 ENCOUNTER — Other Ambulatory Visit: Payer: Self-pay

## 2024-06-06 ENCOUNTER — Other Ambulatory Visit

## 2024-06-06 DIAGNOSIS — Z3A01 Less than 8 weeks gestation of pregnancy: Secondary | ICD-10-CM

## 2024-06-06 DIAGNOSIS — O3680X0 Pregnancy with inconclusive fetal viability, not applicable or unspecified: Secondary | ICD-10-CM

## 2024-06-06 DIAGNOSIS — Z3491 Encounter for supervision of normal pregnancy, unspecified, first trimester: Secondary | ICD-10-CM | POA: Diagnosis not present
